# Patient Record
Sex: Female | Born: 1960 | Race: White | Hispanic: Yes
Health system: Midwestern US, Community
[De-identification: ages and names within clinical notes are randomized; demographics above are authoritative.]

## PROBLEM LIST (undated history)

## (undated) DIAGNOSIS — F329 Major depressive disorder, single episode, unspecified: Secondary | ICD-10-CM

## (undated) DIAGNOSIS — C801 Malignant (primary) neoplasm, unspecified: Secondary | ICD-10-CM

## (undated) DIAGNOSIS — R609 Edema, unspecified: Secondary | ICD-10-CM

## (undated) DIAGNOSIS — D1771 Benign lipomatous neoplasm of kidney: Secondary | ICD-10-CM

## (undated) DIAGNOSIS — Z8719 Personal history of other diseases of the digestive system: Secondary | ICD-10-CM

## (undated) DIAGNOSIS — U071 COVID-19: Secondary | ICD-10-CM

## (undated) DIAGNOSIS — Z973 Presence of spectacles and contact lenses: Secondary | ICD-10-CM

## (undated) DIAGNOSIS — C44310 Basal cell carcinoma of skin of unspecified parts of face: Secondary | ICD-10-CM

## (undated) DIAGNOSIS — D2372 Other benign neoplasm of skin of left lower limb, including hip: Secondary | ICD-10-CM

## (undated) DIAGNOSIS — Z85038 Personal history of other malignant neoplasm of large intestine: Secondary | ICD-10-CM

## (undated) DIAGNOSIS — C189 Malignant neoplasm of colon, unspecified: Secondary | ICD-10-CM

## (undated) DIAGNOSIS — F32A Depression, unspecified: Secondary | ICD-10-CM

## (undated) DIAGNOSIS — M069 Rheumatoid arthritis, unspecified: Secondary | ICD-10-CM

## (undated) DIAGNOSIS — K219 Gastro-esophageal reflux disease without esophagitis: Secondary | ICD-10-CM

## (undated) DIAGNOSIS — M159 Polyosteoarthritis, unspecified: Secondary | ICD-10-CM

## (undated) DIAGNOSIS — A6009 Herpesviral infection of other urogenital tract: Secondary | ICD-10-CM

## (undated) DIAGNOSIS — M150 Primary generalized (osteo)arthritis: Secondary | ICD-10-CM

## (undated) DIAGNOSIS — R7301 Impaired fasting glucose: Secondary | ICD-10-CM

## (undated) DIAGNOSIS — R5383 Other fatigue: Secondary | ICD-10-CM

## (undated) DIAGNOSIS — Z1211 Encounter for screening for malignant neoplasm of colon: Secondary | ICD-10-CM

## (undated) DIAGNOSIS — J019 Acute sinusitis, unspecified: Secondary | ICD-10-CM

## (undated) DIAGNOSIS — R7303 Prediabetes: Secondary | ICD-10-CM

## (undated) HISTORY — DX: Personal history of other malignant neoplasm of large intestine: Z85.038

## (undated) HISTORY — DX: Malignant neoplasm of colon, unspecified: C18.9

## (undated) HISTORY — PX: TUBAL LIGATION: SHX77

## (undated) HISTORY — DX: Benign lipomatous neoplasm of kidney: D17.71

## (undated) HISTORY — DX: Rheumatoid arthritis, unspecified: M06.9

## (undated) HISTORY — DX: Edema, unspecified: R60.9

## (undated) HISTORY — PX: COLON SURGERY: SHX602

## (undated) HISTORY — DX: Major depressive disorder, single episode, unspecified: F32.9

## (undated) HISTORY — DX: COVID-19: U07.1

## (undated) HISTORY — DX: Basal cell carcinoma of skin of unspecified parts of face: C44.310

## (undated) HISTORY — DX: Depression, unspecified: F32.A

## (undated) HISTORY — PX: COLONOSCOPY: SHX174

## (undated) HISTORY — DX: Other benign neoplasm of skin of left lower limb, including hip: D23.72

## (undated) HISTORY — PX: OTHER SURGICAL HISTORY: SHX169

---

## 1995-04-03 ENCOUNTER — Encounter: Payer: Self-pay | Admitting: Family Medicine

## 1995-04-03 LAB — CONVERTED CEMR LAB
Blood Glucose, Fasting: 94 mg/dL
RBC count: 4.83 10*6/uL
WBC, blood: 7.7 10*3/uL

## 1995-04-16 ENCOUNTER — Encounter: Payer: Self-pay | Admitting: Family Medicine

## 1995-04-16 LAB — CONVERTED CEMR LAB: Pap Smear: NORMAL

## 1996-10-05 ENCOUNTER — Encounter: Payer: Self-pay | Admitting: Family Medicine

## 1996-10-05 LAB — CONVERTED CEMR LAB
Pap Smear: NORMAL
TSH: 1.37 microintl units/mL

## 1997-12-12 ENCOUNTER — Encounter: Payer: Self-pay | Admitting: Family Medicine

## 1997-12-12 LAB — CONVERTED CEMR LAB: Pap Smear: NORMAL

## 1999-08-30 ENCOUNTER — Encounter: Payer: Self-pay | Admitting: Family Medicine

## 1999-09-02 ENCOUNTER — Encounter: Payer: Self-pay | Admitting: Family Medicine

## 1999-09-02 LAB — CONVERTED CEMR LAB: Rapid Strep: NEGATIVE

## 1999-09-05 ENCOUNTER — Encounter: Payer: Self-pay | Admitting: Family Medicine

## 1999-09-05 LAB — CONVERTED CEMR LAB
Blood Glucose, Fasting: 115 mg/dL
RBC count: 4.42 10*6/uL
WBC, blood: 6.2 10*3/uL

## 2000-06-25 ENCOUNTER — Encounter: Payer: Self-pay | Admitting: Family Medicine

## 2000-06-25 LAB — CONVERTED CEMR LAB
RBC count: 3.81 10*6/uL
WBC, blood: 6.1 10*3/uL

## 2000-06-29 ENCOUNTER — Encounter: Payer: Self-pay | Admitting: Family Medicine

## 2000-06-29 ENCOUNTER — Other Ambulatory Visit: Admission: RE | Admit: 2000-06-29 | Discharge: 2000-06-29 | Payer: Self-pay | Admitting: Family Medicine

## 2000-06-29 LAB — CONVERTED CEMR LAB: Pap Smear: NORMAL

## 2000-07-07 ENCOUNTER — Encounter: Payer: Self-pay | Admitting: Family Medicine

## 2000-07-07 ENCOUNTER — Encounter: Admission: RE | Admit: 2000-07-07 | Discharge: 2000-07-07 | Payer: Self-pay | Admitting: Family Medicine

## 2000-09-16 ENCOUNTER — Encounter: Payer: Self-pay | Admitting: Family Medicine

## 2000-09-16 LAB — CONVERTED CEMR LAB: RBC count: 4.63 10*6/uL

## 2000-12-09 ENCOUNTER — Encounter: Payer: Self-pay | Admitting: Family Medicine

## 2000-12-09 LAB — CONVERTED CEMR LAB: RBC count: 4.38 10*6/uL

## 2000-12-11 ENCOUNTER — Encounter: Payer: Self-pay | Admitting: Family Medicine

## 2001-03-30 ENCOUNTER — Other Ambulatory Visit: Admission: RE | Admit: 2001-03-30 | Discharge: 2001-03-30 | Payer: Self-pay | Admitting: Family Medicine

## 2001-03-30 ENCOUNTER — Encounter: Payer: Self-pay | Admitting: Family Medicine

## 2001-03-30 LAB — CONVERTED CEMR LAB: Pap Smear: NORMAL

## 2002-10-12 ENCOUNTER — Encounter: Payer: Self-pay | Admitting: Family Medicine

## 2002-10-12 LAB — CONVERTED CEMR LAB: Blood Glucose, Fasting: 68 mg/dL

## 2002-10-25 ENCOUNTER — Encounter: Payer: Self-pay | Admitting: Family Medicine

## 2002-10-25 ENCOUNTER — Other Ambulatory Visit: Admission: RE | Admit: 2002-10-25 | Discharge: 2002-10-25 | Payer: Self-pay | Admitting: Family Medicine

## 2002-10-25 LAB — CONVERTED CEMR LAB: Pap Smear: NORMAL

## 2003-01-27 ENCOUNTER — Encounter: Payer: Self-pay | Admitting: Family Medicine

## 2003-01-27 LAB — CONVERTED CEMR LAB
RBC count: 4.37 10*6/uL
WBC, blood: 5.9 10*3/uL

## 2003-04-06 ENCOUNTER — Encounter: Payer: Self-pay | Admitting: Emergency Medicine

## 2003-04-06 ENCOUNTER — Emergency Department (HOSPITAL_COMMUNITY): Admission: EM | Admit: 2003-04-06 | Discharge: 2003-04-06 | Payer: Self-pay | Admitting: Emergency Medicine

## 2003-05-25 ENCOUNTER — Encounter: Payer: Self-pay | Admitting: Family Medicine

## 2003-05-25 LAB — CONVERTED CEMR LAB
RBC count: 4.57 10*6/uL
WBC, blood: 5.2 10*3/uL

## 2003-09-18 ENCOUNTER — Other Ambulatory Visit: Admission: RE | Admit: 2003-09-18 | Discharge: 2003-09-18 | Payer: Self-pay | Admitting: Obstetrics and Gynecology

## 2003-11-15 ENCOUNTER — Encounter: Payer: Self-pay | Admitting: Family Medicine

## 2004-01-12 ENCOUNTER — Encounter (INDEPENDENT_AMBULATORY_CARE_PROVIDER_SITE_OTHER): Payer: Self-pay | Admitting: Specialist

## 2004-01-12 ENCOUNTER — Ambulatory Visit (HOSPITAL_COMMUNITY): Admission: RE | Admit: 2004-01-12 | Discharge: 2004-01-12 | Payer: Self-pay | Admitting: Obstetrics and Gynecology

## 2005-01-03 ENCOUNTER — Ambulatory Visit: Payer: Self-pay | Admitting: Family Medicine

## 2006-02-06 ENCOUNTER — Ambulatory Visit: Payer: Self-pay | Admitting: Family Medicine

## 2006-03-09 ENCOUNTER — Ambulatory Visit: Payer: Self-pay | Admitting: Family Medicine

## 2006-03-20 ENCOUNTER — Ambulatory Visit: Payer: Self-pay | Admitting: Family Medicine

## 2007-05-18 ENCOUNTER — Ambulatory Visit: Payer: Self-pay | Admitting: Family Medicine

## 2007-05-18 DIAGNOSIS — T50995A Adverse effect of other drugs, medicaments and biological substances, initial encounter: Secondary | ICD-10-CM | POA: Insufficient documentation

## 2007-05-19 ENCOUNTER — Telehealth: Payer: Self-pay | Admitting: Family Medicine

## 2007-08-30 ENCOUNTER — Telehealth: Payer: Self-pay | Admitting: Family Medicine

## 2008-07-28 ENCOUNTER — Telehealth: Payer: Self-pay | Admitting: Family Medicine

## 2008-08-03 ENCOUNTER — Ambulatory Visit: Payer: Self-pay | Admitting: Family Medicine

## 2008-08-03 DIAGNOSIS — G4700 Insomnia, unspecified: Secondary | ICD-10-CM

## 2008-08-03 DIAGNOSIS — F329 Major depressive disorder, single episode, unspecified: Secondary | ICD-10-CM

## 2008-08-08 ENCOUNTER — Telehealth: Payer: Self-pay | Admitting: Family Medicine

## 2008-08-08 ENCOUNTER — Encounter: Payer: Self-pay | Admitting: Family Medicine

## 2008-08-09 ENCOUNTER — Telehealth: Payer: Self-pay | Admitting: Family Medicine

## 2008-09-08 ENCOUNTER — Telehealth: Payer: Self-pay | Admitting: Family Medicine

## 2009-01-25 ENCOUNTER — Encounter: Payer: Self-pay | Admitting: Family Medicine

## 2009-01-26 ENCOUNTER — Ambulatory Visit: Payer: Self-pay | Admitting: Family Medicine

## 2009-01-26 DIAGNOSIS — M2550 Pain in unspecified joint: Secondary | ICD-10-CM | POA: Insufficient documentation

## 2009-01-26 DIAGNOSIS — M25469 Effusion, unspecified knee: Secondary | ICD-10-CM | POA: Insufficient documentation

## 2009-01-30 LAB — CONVERTED CEMR LAB
Anti Nuclear Antibody(ANA): NEGATIVE
Eosinophils Absolute: 0.2 10*3/uL (ref 0.0–0.7)
Eosinophils Relative: 2 % (ref 0–5)
HCT: 35.5 % — ABNORMAL LOW (ref 36.0–46.0)
Lymphs Abs: 1.5 10*3/uL (ref 0.7–4.0)
MCV: 83.5 fL (ref 78.0–100.0)
Monocytes Absolute: 0.9 10*3/uL (ref 0.1–1.0)
Neutrophil, Synovial: 2 % (ref 0–25)
Platelets: 313 10*3/uL (ref 150–400)
RDW: 13.6 % (ref 11.5–15.5)
Rhuematoid fact SerPl-aCnc: 23 intl units/mL — ABNORMAL HIGH (ref 0–20)
WBC, Synovial: 1440 — ABNORMAL HIGH (ref 0–200)
WBC: 12.9 10*3/uL — ABNORMAL HIGH (ref 4.0–10.5)

## 2009-02-02 ENCOUNTER — Ambulatory Visit: Payer: Self-pay | Admitting: Family Medicine

## 2009-02-07 ENCOUNTER — Encounter (INDEPENDENT_AMBULATORY_CARE_PROVIDER_SITE_OTHER): Payer: Self-pay | Admitting: *Deleted

## 2009-02-08 ENCOUNTER — Encounter: Payer: Self-pay | Admitting: Family Medicine

## 2009-02-23 ENCOUNTER — Encounter: Payer: Self-pay | Admitting: Family Medicine

## 2009-03-23 ENCOUNTER — Encounter: Payer: Self-pay | Admitting: Family Medicine

## 2009-06-22 ENCOUNTER — Encounter: Payer: Self-pay | Admitting: Family Medicine

## 2009-07-06 ENCOUNTER — Ambulatory Visit: Payer: Self-pay | Admitting: Family Medicine

## 2009-07-06 DIAGNOSIS — J020 Streptococcal pharyngitis: Secondary | ICD-10-CM

## 2009-10-19 ENCOUNTER — Encounter: Payer: Self-pay | Admitting: Family Medicine

## 2009-11-16 ENCOUNTER — Encounter: Payer: Self-pay | Admitting: Family Medicine

## 2009-12-10 ENCOUNTER — Telehealth: Payer: Self-pay | Admitting: Family Medicine

## 2010-02-11 ENCOUNTER — Ambulatory Visit: Payer: Self-pay | Admitting: Family Medicine

## 2010-02-11 ENCOUNTER — Telehealth: Payer: Self-pay | Admitting: Family Medicine

## 2010-02-11 DIAGNOSIS — R05 Cough: Secondary | ICD-10-CM

## 2010-02-13 ENCOUNTER — Telehealth: Payer: Self-pay | Admitting: Family Medicine

## 2010-02-26 ENCOUNTER — Encounter (INDEPENDENT_AMBULATORY_CARE_PROVIDER_SITE_OTHER): Payer: Self-pay | Admitting: *Deleted

## 2010-05-14 ENCOUNTER — Encounter: Payer: Self-pay | Admitting: Family Medicine

## 2010-06-20 ENCOUNTER — Encounter: Payer: Self-pay | Admitting: Family Medicine

## 2010-06-21 ENCOUNTER — Telehealth: Payer: Self-pay | Admitting: Family Medicine

## 2010-07-23 ENCOUNTER — Inpatient Hospital Stay: Admit: 2010-07-23 | Discharge: 2010-07-23 | Disposition: A | Discharge status: Home / Self Care

## 2010-07-23 MED ORDER — TETANUS-DIPHTHERIA TOXOIDS TD 5-2 LFU IM INJ
5-2 LFU | Freq: Once | INTRAMUSCULAR | Status: AC
Start: 2010-07-23 — End: 2010-07-23
  Administered 2010-07-23: 18:00:00 via INTRAMUSCULAR

## 2010-07-23 MED FILL — BACITRACIN ZINC 500 UNIT/GM EX OINT: 500 UNIT/GM | CUTANEOUS | Qty: 1

## 2010-07-23 MED FILL — DECAVAC 5-2 LFU IM INJ: 5-2 LFU | INTRAMUSCULAR | Qty: 0.5

## 2010-07-23 NOTE — ED Notes (Signed)
Tetanus info. Sheet provided to pt.    Elsworth SohoWilliam J Drakkar Medeiros, RN  07/23/10 [phone]

## 2010-07-23 NOTE — Discharge Instructions (Signed)
Laceration Care  A laceration is a cut or lesion that goes through all layers of the skin and into the tissue just beneath the skin.    BEFORE THE PROCEDURE  The laceration will be inspected by your caregiver for amount and extent of tissue damage, for bleeding, for evidence of foreign bodies (pieces of dirt, glass, etc.), and for cleanliness. Pain medications can be used if necessary. The wound will then be cleansed to help prevent infection. Sutures, staples or skin adhesive strips will be used to close the wound, stop bleeding and speed healing. Sometimes this will decrease the likelihood of infection and bleeding.  LET YOUR CAREGIVERS KNOW ABOUT THE FOLLOWING:   Allergies.   Medications taken including herbs, eye drops, over the counter medications, and creams.    Use of steroids (by mouth or creams).    Previous problems with anesthetics or novocaine.   Possibility of pregnancy, if this applies.   History of blood clots (thrombophlebitis).    History of bleeding or blood problems.    Previous surgery.    Other health problems.    RISKS AND COMPLICATIONS  Most lacerations heal fully. The healing time required varies depending on location. Complications of a laceration can include pain, bleeding, infection, dehiscence (splitting open or separation of the wound edges) and scar formation. The likelihood of complication depends on wound complexity, location, and on how the laceration occurred.  HOME CARE INSTRUCTIONS   If you were given a dressing, you should change it at least once a day, or as instructed by your caregiver. If the bandage sticks, soak it off with water.    Twice a day, wash the area with soap and water and rinse with plain water to remove all soap. Pat (do not rub) dry with a clean towel. Look for signs of infection (see below).    Re-apply prescribed creams or ointments as instructed. This will help prevent infection. This also helps keep the bandage from sticking.    If the bandage  becomes wet, dirty, or develops a foul smell, change it as soon as possible.    Only take over-the-counter or prescription medicines for pain, discomfort, or fever as directed by your doctor.    Have your sutures, staples or skin adhesive strips removed as instructed. Skin adhesive strips will peel off from the outer edges toward the center and will eventually fall off. Report to your caregiver if the strips are falling off and the wound does not appear fully healed.   SEEK MEDICAL CARE IF:   There is redness, swelling, or increasing pain in the wound.    There is a red line that goes up your arm or leg.    Pus is coming from wound.    You develop an unexplained oral temperature above 102 F (38.9 C), or as your caregiver suggests.    You notice a foul smell coming from the wound or dressing.    There is a breaking open of the wound (edges not staying together) before or after sutures have been removed.    You notice something coming out of the wound such as wood or glass.    The wound is on your hand or foot and you find that you are unable to properly move a finger or toe.    There is severe swelling around the wound causing pain and numbness or a change in color in your arm, hand, leg, or foot.   SEEK IMMEDIATE MEDICAL CARE IF:  Pain is not controlled with prescribed medication or with acetaminophen or ibuprofen.    There is severe swelling around the wound site.    The wound splits open and bleeding recurs.    You experience worsening numbness, weakness, or loss of function of any joint around or beyond the wound.    You develop painful lumps near the wound or on the skin anywhere on your body.   If you did not receive a tetanus shot today because you did not recall when your last one was given, make sure to check with your caregiver when you have your sutures removed to determine if you need one.  MAKE SURE YOU:    Understand these instructions.    Will watch your condition.    Will get  help right away if you are not doing well or get worse.   Document Released: 07/07/2005 Document Re-Released: 10/01/2009  [facility] Patient Information 2011 [place].

## 2010-07-23 NOTE — ED Provider Notes (Signed)
HPI  HISTORY OF PRESENT ILLNESS:  (Nurses Notes Reviewed)    CC: LACERATION    Source of history provided by:  patient.  Zykeia Naslund is a 50 y.o. old female presenting to the emergency department by private vehicle and ambulatory, for a laceration to the RMF, caused by metal edge, which occured a few hour(s) prior to arrival. There is not a possibility of retained foreign body in the affected area.  The patients tetanus status is more than 10 years ago.   Bleeding is  controlled. There is not pain at injury site. The injury was work related.     Past Medical History: has no past medical history on file.    Past Surgical History: has no past surgical history on file.    Social History:  reports that she has been smoking cigarettes.  She has been smoking about .25 packs per day. She does not have any smokeless tobacco history on file.  She reports that she does not currently drink alcohol or use illicit drugs.    Family History: family history is not on file.     The patient???s home medications have been reviewed.    Allergies: Codeine    REVIEW OF SYSTEMS:  Unless otherwise stated in this report or unable to obtain because of the patient's clinical or mental status as evidenced by the medical record, this patients's positive and negative responses for Review of Systems, constitutional, psych, eyes, ENT, cardiovascular, respiratory, gastrointestinal, neurological, genitourinary, musculoskeletal, integument systems and systems related to the presenting problem are either stated in the preceding or were not pertinent or were negative for the symptoms and/or complaints related to the medical problem.    Review of Systems    Physical Exam  PHYSICAL EXAM (Vital signs reviewed)    CONSTITUTIONAL:  Alert, development consistent with age.  NECK:  Normal ROM.  Supple. Non-tender.  FINGER(S):  Right 3rd dorsal aspect of  Proximal Phalanx and Middle Phalanx.         Tenderness:  none..         Swelling: None.           Deformity: no.        ROM: full range of motion.     HAND:         Tenderness:  none.        Swelling: None.       Deformity: no.       ROM full range of motion.   NEUROVASCULAR:         Motor deficit: none.        Sensory deficit: none.         Pulse deficit: none.         Capillary refill: normal.  SKIN:         Erythema: no.          Ecchymosis: no.       Abrasions: yes: linear.        Foreign Body:  No.          Lacerations:  yes: Laceration:   #1       Location:  RMF dorsum       Size:  2.5 cm       Type/Depth: subcutaneous.  The wound was explored with the following results: No foreign bodies found, No tendon laceration seen.  .       Rash:  none Location: N/A.       Abscess: none. Location: N/A.  LYMPHATICS: No lymphangitis or adenopathy noted.Marland Kitchen  NEUROLOGICAL:  Oriented.  Motor functions intact.    --------------------------------------------------RESULTS --------------------------------------------------  No results found for this or any previous visit.       ---------------------------- NURSING NOTES AND VITALS REVIEWED  -------------------------   The nursing notes within the ED encounter and vital signs as below have been reviewed.   BP 143/72   Pulse 82   Temp(Src) 98.1 ??F (36.7 ??C) (Oral)   Resp 14   Ht 5' 6"$  (1.676 m)   Wt 115 lb (52.164 kg)   BMI 18.56 kg/m2   SpO2 99%  Oxygen Saturation Interpretation: Normal      ------------------------------------------PROGRESS NOTES -------------------------------------------   I have spoken with the patient and discussed today???s results, in addition to providing specific details for the plan of care and counseling regarding the diagnosis and prognosis.  Their questions are answered at this time and they are agreeable with the plan.    ED Course Medications:                Medications   BuPROPion HCl (WELLBUTRIN PO) (not administered)   tetanus & diphtheria toxoids (adult) 5-2 LFU injection 0.5 mL (not administered)       Re-examinations:               none.    Consultations:              none.    PROCEDURES:              No closure required.Marland Kitchen    --------------------------------------- IMPRESSION & DISPOSITION -----------------------------    IMPRESSION:  1. 2.5cm Laceration RMF        DISPOSITION  Disposition: Discharge to work  Patient condition is good    Procedures    MDM            Elias Else, PA  07/23/10 1250  ATTENDING PROVIDER ATTESTATION:     I have personally performed and/or participated in the history, exam, medical decision making, and procedures and agree with all pertinent clinical information.      I have also reviewed and agree with the past medical, family and social history unless otherwise noted.      Elisabeth Cara, MD  07/23/10 [phone]

## 2010-08-22 NOTE — Assessment & Plan Note (Signed)
Summary: severe cough/alc   Vital Signs:  Patient profile:   50 year old female Weight:      137 pounds Temp:     98.9 degrees F oral Pulse rate:   88 / minute Pulse rhythm:   regular BP sitting:   120 / 80  (right arm) Cuff size:   regular  Vitals Entered By: Lowella Petties CMA (February 11, 2010 11:01 AM) CC: Cough since last week, fever of 101.3 this morning   History of Present Illness: Cough- Started with mild dry cough last week.  Then it "want croupy" a few days ago.  patient with fever last night along with sweats.  +Posttussive emesis (no NAV o/w) and throat is irritated. No sick contacts.  Minimal clear sputum.   No abdominal pain.  Ears and nose- no symptoms.  H/o RA and on MTX.    Depressed Mood: h/o and mMuch improved on meds.  Sleep decreased/ Insomnia/Early awakening: no Interest decreased in activities: not now Guilt or worthlessness:no Energy decreased:no Concentration difficulties:no Appetite disturbance or weight loss:no Psychomotor retardation/agitation:no Suicidal thoughts: no  Allergies (verified): No Known Drug Allergies  Past History:  Past Medical History: Depression:(03/04/1996) RA on MTX  Review of Systems       See HPI.  Otherwise negative.    Physical Exam  General:  GEN: nad, alert and oriented HEENT: mucous membranes moist, TM w/o erythema, nasal epithelium mildly injected, OP with cobblestoning but no exudates NECK: supple w/o LA CV: rrr. PULM: ctab, no inc wob, dry cough noted ABD: soft, +bs EXT: no edema    Impression & Recommendations:  Problem # 1:  COUGH (ICD-786.2) Supportive tx and use antibiotics in meantime.  Nontoxic but would treat given duration and MTX.  follow up as needed.  Out of work due to fever.  She agrees.   Problem # 2:  DEPRESSION, CHRONIC (ICD-311) No change in total daily dose of meds.  Mood stable.  The following medications were removed from the medication list:    Cymbalta 60 Mg Cpep (Duloxetine  hcl) .Marland Kitchen... 1daily by mouth Her updated medication list for this problem includes:    Cymbalta 30 Mg Cpep (Duloxetine hcl) .Marland Kitchen... Three caps by mouth once daily (90mg  total)  Complete Medication List: 1)  Cymbalta 30 Mg Cpep (Duloxetine hcl) .... Three caps by mouth once daily (90mg  total) 2)  Folic Acid 400 Mcg Tabs (Folic acid) .... Take one tablet daily 3)  Ibuprofen 800 Mg Tabs (Ibuprofen) .... Take one tablet by mouth three times daily 4)  Methotrexate 2.5 Mg Tabs (Methotrexate sodium) .... Take six pills by mouth once a week 5)  Azithromycin 250 Mg Tabs (Azithromycin) .... 2 by mouth for 1 day then 1 by mouth qday for 4 days 6)  Tussigon 5-1.5 Mg Tabs (Hydrocodone-homatropine) .Marland Kitchen.. 1 by mouth q6h as needed cough.  sedation caution  Patient Instructions: 1)  Please schedule a follow-up appointment as needed .  2)  Out of work until no fever.  Take the antibiotics; use the cough med as needed.   Prescriptions: TUSSIGON 5-1.5 MG TABS (HYDROCODONE-HOMATROPINE) 1 by mouth q6h as needed cough.  Sedation caution  #20 x 0   Entered and Authorized by:   Crawford Givens MD   Signed by:   Crawford Givens MD on 02/11/2010   Method used:   Print then Give to Patient   RxID:   [id number] AZITHROMYCIN 250 MG TABS (AZITHROMYCIN) 2 by mouth for 1 day then 1 by  mouth qday for 4 days  #6 x 0   Entered and Authorized by:   Crawford Givens MD   Signed by:   Crawford Givens MD on 02/11/2010   Method used:   Print then Give to Patient   RxID:   [id number] CYMBALTA 30 MG CPEP (DULOXETINE HCL) three caps by mouth once daily (90mg  total)  #270 x 3   Entered and Authorized by:   Crawford Givens MD   Signed by:   Crawford Givens MD on 02/11/2010   Method used:   Print then Give to Patient   RxID:   [id number]   Prior Medications (reviewed today): FOLIC ACID 400 MCG TABS (FOLIC ACID) take one tablet daily IBUPROFEN 800 MG TABS (IBUPROFEN) take one tablet by mouth three times daily METHOTREXATE 2.5  MG TABS (METHOTREXATE SODIUM) take six pills by mouth once a week Current Allergies (reviewed today): No known allergies

## 2010-08-22 NOTE — Letter (Signed)
Summary: Dr.G.Lyna Poser  Dr.G.Lyna Poser   Imported By: Beau Fanny 11/27/2009 16:29:18  _____________________________________________________________________  External Attachment:    Type:   Image     Comment:   External Document

## 2010-08-22 NOTE — Letter (Signed)
Summary: [facility]  [facility]   Imported By: Maryln Gottron 07/01/2010 13:28:51  _____________________________________________________________________  External Attachment:    Type:   Image     Comment:   External Document  Appended Document: [facility]    Clinical Lists Changes

## 2010-08-22 NOTE — Progress Notes (Signed)
Summary: Rx Cymbalta  Phone Note Refill Request Call back at [phone] Message [facility]  [facility] Dec 10, 2009 2:56 PM  Refills Requested: Medication #1:  CYMBALTA 60 MG  CPEP 1daily by mouth  Method Requested: Electronic Initial call taken by: Sydell Axon LPN,  Dec 10, 2009 2:56 PM  Follow-up for Phone Call        Pt needs to be seen for further medications. Follow-up by: Shaune Leeks MD,  Dec 10, 2009 4:47 PM  Additional Follow-up for Phone Call Additional follow up Details #1::        Pharmacist Joni Reining) notified as instructed by telephone and was informed that she will inform patient of this. Additional Follow-up by: Sydell Axon LPN,  Dec 10, 2009 4:56 PM    Prescriptions: CYMBALTA 60 MG  CPEP (DULOXETINE HCL) 1daily by mouth  #30 x 0   Entered and Authorized by:   Shaune Leeks MD   Signed by:   Shaune Leeks MD on 12/10/2009   Method used:   [facility]        [facility] ([facility]ail)       [address]       [place], [place]  [address]       Ph: [phone] or [phone]       Fax: [phone]   RxID:   [id number]

## 2010-08-22 NOTE — Letter (Signed)
Summary: Nadara Eaton letter  [place] at [facility]  [address] [place], [place] [address]   Phone: [phone]  Fax: [phone]       02/26/2010 MRN: [id number]  [facility] [address] [place], [place]  [address]  Dear Ms. Maisie Fus,  [facility] [place], and [facility] announce the retirement of Arta Silence, M.D., from full-time practice at the [facility] office effective January 17, 2010 and his plans of returning part-time.  It is important to Dr. Hetty Ely and to our practice that you understand that [facility] has seven physicians in our office for your health care needs.  We will continue to offer the same exceptional care that you have today.    Dr. Hetty Ely has spoken to many of you about his plans for retirement and returning part-time in the fall.   We will continue to work with you through the transition to schedule appointments for you in the office and meet the high standards that [place] is committed to.   Again, it is with great pleasure that we share the news that Dr. Hetty Ely will return to [facility] at [facility] in October of 2011 with a reduced schedule.    If you have any questions, or would like to request an appointment with one of our physicians, please call us at [phone] and press the option for Scheduling an appointment.  We take pleasure in providing you with excellent patient care and look forward to seeing you at your next office visit.  Our [facility] Physicians are:  Tillman Abide, M.D. Laurita Quint, M.D. Roxy Manns, M.D. Kerby Nora, M.D. Hannah Beat, M.D. Ruthe Mannan, M.D. We proudly welcomed Raechel Ache, M.D. and Eustaquio Boyden, M.D. to the practice in July/August 2011.  Sincerely,  [facility]

## 2010-08-22 NOTE — Progress Notes (Signed)
Summary: regarding tussigon, cymbalta  Phone Note Refill Request Message from:  Pharmacy  Refills Requested: Medication #1:  TUSSIGON 5-1.5 MG TABS 1 by mouth q6h as needed cough.  [facility] caution. [facility], please change to something else.  Also, on her cymbalta, she has been getting 30 mg's, taking 3 a day and she is asking if ok to change to one 60 mg and one 30 mg daily.  She says this would be cheaper for her.  [facility]  Initial call taken by: Lowella Petties CMA,  February 11, 2010 1:17 PM  Follow-up for Phone Call        dc prev rxs; please send in these 3 rxs: 1. chlorpheniramine/hydrocodone susp 8mg /10mg /75ml.  5 ml by mouth two times a day as needed cough. #179ml no rf.   2. cymbalta 30mg  by mouth qday.  #90, 3rf 3. cymbalta 60mg  by mouth qday.  #90, 3rf Follow-up by: Crawford Givens MD,  February 11, 2010 1:41 PM  Additional Follow-up for Phone Call Additional follow up Details #1::        Medication phoned to pharmacy.  Additional Follow-up by: Delilah Shan CMA Zaylon Bossier Dull),  February 11, 2010 2:40 PM

## 2010-08-22 NOTE — Letter (Signed)
Summary: [facility]  [facility]   Imported By: Maryln Gottron 05/23/2010 14:45:08  _____________________________________________________________________  External Attachment:    Type:   Image     Comment:   External Document  Appended Document: [facility]    Clinical Lists Changes  Observations: Added new observation of PAST MED HX: Depression:(03/04/1996) arthritis per rheum [facility]) (05/23/2010 15:21)       Past History:  Past Medical History: Depression:(03/04/1996) arthritis per rheum [facility])

## 2010-08-22 NOTE — Progress Notes (Signed)
Summary: refill request for cymbalta  Phone Note Refill Request Message from:  Fax from Pharmacy  Refills Requested: Medication #1:  CYMBALTA 30 MG CPEP three caps by mouth once daily (90mg  ttal)   Last Refilled: 03/20/2010  Medication #2:  Cymbalta 60 mg   Last Refilled: 03/20/2010 [facility] [address]  The 60 mg dose is no longer on med list.  Initial call taken by: Lowella Petties CMA, AAMA,  June 21, 2010 9:04 AM  Follow-up for Phone Call        please clarify with pharm and deny this.  he is taking Cymbalta 30 Mg Cpep (Duloxetine hcl) .... Three caps by mouth once daily (90mg  total), so he shouldn't need the 60mg  dose.  thanks.   Follow-up by: Crawford Givens MD,  June 21, 2010 9:50 AM  Additional Follow-up for Phone Call Additional follow up Details #1::        Phoned pharmacy and left a message as above.  Lugene Fuquay CMA (AAMA)  June 21, 2010 10:28 AM   Pt called and said she needs seperate 30 and 60 mg scripts for insurance reasons, less costly that way.  Also, she wants a 90 day supply of each. Additional Follow-up by: Lowella Petties CMA, AAMA,  June 21, 2010 12:07 PM    Additional Follow-up for Phone Call Additional follow up Details #2::    void out the prev rx.  please send in rx for cymbalta 60mg  a day, #90, 3rf and rx for cymbalta 30mg  a day, #90, 3rf.  thanks.  please update med list.  Follow-up by: Crawford Givens MD,  June 21, 2010 2:02 PM  New/Updated Medications: CYMBALTA 60 MG CPEP (DULOXETINE HCL) Take 1 tablet by mouth once a day CYMBALTA 30 MG CPEP (DULOXETINE HCL) Take 1 tablet by mouth once a day Prescriptions: CYMBALTA 30 MG CPEP (DULOXETINE HCL) Take 1 tablet by mouth once a day  #90 x 3   Entered by:   Delilah Shan CMA (AAMA)   Authorized by:   Crawford Givens MD   Signed by:   Delilah Shan CMA (AAMA) on 06/21/2010   Method used:   [facility]        [id number]       [address]       [place], [place]  [address]       Ph: [phone] or [phone]       Fax: [phone]   RxID:   [id number] CYMBALTA 60 MG CPEP (DULOXETINE HCL) Take 1 tablet by mouth once a day  #90 x 3   Entered by:   Delilah Shan CMA (AAMA)   Authorized by:   Crawford Givens MD   Signed by:   Delilah Shan CMA (AAMA) on 06/21/2010   Method used:   [facility]        [facility] [address] (retail)       [address]       [place], [place]  [address]       Ph: [phone] or [phone]       Fax: [phone]   RxID:   [id number]

## 2010-08-22 NOTE — Letter (Signed)
Summary: Dr.G.Lyna Poser  Dr.G.Lyna Poser   Imported By: Beau Fanny 10/30/2009 10:43:25  _____________________________________________________________________  External Attachment:    Type:   Image     Comment:   External Document

## 2010-08-22 NOTE — Progress Notes (Signed)
Summary: not any better  Phone Note Call from Patient Call back at Home Phone [phone]   Caller: Patient Summary of Call: Pt was seen on monday for a cough.  She is not any better.  Cough medicine isnt helping, she still has low grade fever.  She had some wheezing this morning.  Taking z-pack.  [facility]. Initial call taken by: Lowella Petties CMA,  February 13, 2010 10:17 AM  Follow-up for Phone Call        Please call in proventil HFA, 2 puffs q 4 h as needed for cough or wheeze. #1, no rf.  If progressively short of breath, then needs follow up with MD.  I would finisht the antibiotics.  Follow-up by: Crawford Givens MD,  February 13, 2010 11:14 AM  Additional Follow-up for Phone Call Additional follow up Details #1::        Patient Advised.  Additional Follow-up by: Delilah Shan CMA (AAMA),  February 13, 2010 5:55 PM    New/Updated Medications: PROVENTIL HFA 108 (90 BASE) MCG/ACT AERS (ALBUTEROL SULFATE) Take 2 puffs every 4 hours as needed for cough and wheeze Prescriptions: PROVENTIL HFA 108 (90 BASE) MCG/ACT AERS (ALBUTEROL SULFATE) Take 2 puffs every 4 hours as needed for cough and wheeze  #1 x 0   Entered by:   Delilah Shan CMA (AAMA)   Authorized by:   Crawford Givens MD   Signed by:   Delilah Shan CMA (AAMA) on 02/13/2010   Method used:   [facility]        [facility] (retail)       [address]       [place], [place]  [address]       Ph: [phone] or [phone]       Fax: [phone]   RxID:   [id number]

## 2010-10-17 ENCOUNTER — Encounter: Payer: Self-pay | Admitting: Family Medicine

## 2010-10-18 ENCOUNTER — Encounter: Payer: Self-pay | Admitting: Family Medicine

## 2010-10-18 ENCOUNTER — Ambulatory Visit (INDEPENDENT_AMBULATORY_CARE_PROVIDER_SITE_OTHER): Payer: BC Managed Care – PPO | Admitting: Family Medicine

## 2010-10-18 VITALS — BP 130/80 | HR 79 | Temp 97.9°F | Ht 62.0 in | Wt 158.8 lb

## 2010-10-18 DIAGNOSIS — M1380 Other specified arthritis, unspecified site: Secondary | ICD-10-CM | POA: Insufficient documentation

## 2010-10-18 DIAGNOSIS — M064 Inflammatory polyarthropathy: Secondary | ICD-10-CM

## 2010-10-18 DIAGNOSIS — M1990 Unspecified osteoarthritis, unspecified site: Secondary | ICD-10-CM | POA: Insufficient documentation

## 2010-10-18 MED ORDER — PREDNISONE 10 MG PO TABS: ORAL_TABLET | ORAL | Status: AC

## 2010-10-18 NOTE — Progress Notes (Signed)
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 Labs reviewed from last year when I evaluated and had my initial concern and consult notes reviewed.   RA flare: I recall this patient well and was diagnosed with inflammatory arthropathy, and probable rheumatoid arthritis last year. She sees Dr. Gavin Potters, and had been on methotrexate, however she had discontinued this in September, 2011. Since that time she has had gradually increasing arthropathy and joint pains, and within the last 1-2 weeks she has had increasing joint pain and increasing swelling joints.  She does have some morning aching and will have morning aching in her knees, feet as well as her hands for some time. Her hands and wrists aren't bothering her to a great degree right now. He has significant large effusions of the knee as well as swelling in and around her MTPs and her true ankle joint.  No specific trauma or injury.  REVIEW OF SYSTEMS  GEN: No fevers, chills. Nontoxic. Primarily MSK c/o today. MSK: Detailed in the HPI GI: tolerating PO intake without difficulty Neuro: No numbness, parasthesias, or tingling associated. Otherwise the pertinent positives of the ROS are noted above.    The PMH, PSH, Social History, Family History, Medications, and allergies have been reviewed in [facility], and have been updated if relevant.  GEN: Well-developed,well-nourished,in no acute distress; alert,appropriate and cooperative throughout examination HEENT: Normocephalic and atraumatic without obvious abnormalities. Ears, externally no deformities PULM: Breathing comfortably in no respiratory distress EXT: No clubbing, cyanosis, or edema PSYCH: Normally interactive. Cooperative during the interview. Pleasant. Friendly and conversant. Not anxious or depressed appearing. Normal, full affect.  Gait: Normal heel toe pattern, mild antalgia ROM: 0-120 Effusion: Moderate effusions bilateral knees.  Significant ankle effusion.  Echymosis or edema: Mild pedal edema  Patellar tendon NT Painful PLICA: neg Patellar grind: negative Medial and lateral patellar facet loading: negative medial and lateral joint lines: Tender to palpation  Mcmurray's neg Flexion-pinch positive Varus and valgus stress: stable Lachman: neg Ant and Post drawer: neg Hip abduction, IR, ER: WNL Hip flexion str: 5/5 Hip abd: 5/5 Quad: 5/5 VMO atrophy: Mild Hamstring concentric and eccentric: 5/5  Assessment and plan: Rheumatoid arthritis flare: The patient has been taking ibuprofen alone 600 mg by mouth twice a day. Think she is having significant rheumatological flare, multi-joint involvement, MTPs, severe involvement of the knees, as well as the true ankle joints bilaterally. Oral prednisone taper as below. She has followup with rheumatology in one week.

## 2010-12-06 NOTE — Op Note (Signed)
NAME:  Jacqueline Garza, Jacqueline Garza                          ACCOUNT NO.:  [id number]   MEDICAL RECORD NO.:  [id number]                   PATIENT TYPE:  AMB   LOCATION:  SDC                                  FACILITY:  [facility]   PHYSICIAN:  Richardean Sale, M.D.                DATE OF BIRTH:  25-Oct-1960   DATE OF PROCEDURE:  01/12/2004  DATE OF DISCHARGE:                                 OPERATIVE REPORT   PREOPERATIVE DIAGNOSES:  1. Menorrhagia.  2. Anemia.  3. Endometrial polyps.   POSTOPERATIVE DIAGNOSES:  1. Menorrhagia.  2. Anemia.  3. Endometrial polyps.   PROCEDURE:  Hysteroscopy, D&C.   SURGEON:  Richardean Sale, M.D.   ANESTHESIA:  Conscious sedation with paracervical block.   ESTIMATED BLOOD LOSS:  Minimal.   FINDINGS:  Multiple endometrial polyps.   SPECIMENS:  Endometrial polyp sent to pathology.   FLUID DEFICIT:  60 mL.   COMPLICATIONS:  None.   INDICATIONS FOR PROCEDURE:  This is a 50 year old white female with  menorrhagia while on oral contraceptive pills and subsequent anemia who was  evaluated by ultrasound and endometrial biopsy and found to have endometrial  polyps.  She presents today for stress __________.  The procedure has been  reviewed with her in detail and informed consent has been obtained.  We  discussed the risk of hemorrhage, uterine perforation, infection, injury to  bowel or bladder; if perforation occurs, it would require additional  surgery, anesthetic related complications and even death.  The patient  voices understanding of all these risks and agrees to proceed.  Consent  signed before preceding to the OR.   DESCRIPTION OF PROCEDURE:  The patient was taken to the operating room where  she was given conscious sedation.  She was then prepped and draped in the  usual sterile fashion with Hibiclens and placed in the dorsal lithotomy  position.  The red rubber catheter was used to drain the bladder.  Approximately 100 mL of clear urine were expelled.   Bimanual examination was  performed which revealed a retroflexed uterus.  A speculum was placed into  the vagina and 2 mL of 1% Nesacaine were injected on the anterior lip of the  cervix.  Anterior lip was then grasped with single-tooth tenaculum and a  paracervical block was administered using a total of 20 mL of Nesacaine.  At  this point, the uterus was then dilated very slowly with the Hegar dilators.  The speculum had to be removed in order to facilitate dilation given the  patient's retroflexed uterus and tension was held with the single-tooth  tenaculum to align the cervical canal and the fundus.  Once dilation was  performed, the hysteroscope was introduced and there was an obvious polyp  sitting at the internal os of the cervix.  This was removed using polyp  forceps.  The hysteroscope was then reintroduced and there were still  multiple  polyps present.  Sharp curettage was then performed and multiple  polyps were removed.  These were sent to pathology with endometrial polyps  and endometrial curettings.  The hysteroscope was then reintroduced.  The  uterine cavity was now clear of any polyps and the hysteroscope was removed.  There was no bleeding coming from the cervix. Single-tooth tenaculum was  removed. The cervix was removed.  The cervix was inspected and there was no  bleeding coming from the tenaculum site.  Bimanual examination was performed  which confirmed a very small retroflexed uterus.  The patient tolerated the  procedure well.  There were no complications and no evidence for perforation  during the entire procedure.  Sponge, lap, needle and instrument counts  correct x 2.                                               Richardean Sale, M.D.    JW/MEDQ  D:  01/12/2004  T:  01/12/2004  Job:  [id number]

## 2011-04-08 ENCOUNTER — Ambulatory Visit: Payer: Self-pay | Admitting: Gastroenterology

## 2011-04-16 DIAGNOSIS — D1771 Benign lipomatous neoplasm of kidney: Secondary | ICD-10-CM | POA: Insufficient documentation

## 2011-04-21 ENCOUNTER — Ambulatory Visit: Payer: Self-pay | Admitting: Gastroenterology

## 2011-04-22 ENCOUNTER — Encounter: Payer: Self-pay | Admitting: Family Medicine

## 2011-04-30 ENCOUNTER — Encounter: Payer: Self-pay | Admitting: Family Medicine

## 2011-05-02 ENCOUNTER — Ambulatory Visit: Payer: Self-pay | Admitting: Gastroenterology

## 2011-06-26 ENCOUNTER — Other Ambulatory Visit: Payer: Self-pay | Admitting: Family Medicine

## 2011-06-26 NOTE — Telephone Encounter (Signed)
Left message on machine for patient to call back.

## 2011-06-26 NOTE — Telephone Encounter (Signed)
Called pharmacy to make script 90/0RF for both Cymbalta 30 and Cymbalta 60 (computer only let me switch no refills on one of the prescriptions today) and message for pt to be seen by Dr Para March before next refill needed. Please let pt know she needs to be seen before needing another prescription.

## 2011-07-01 NOTE — Telephone Encounter (Signed)
Patient notified as instructed by telephone. 

## 2011-09-23 ENCOUNTER — Other Ambulatory Visit: Payer: Self-pay | Admitting: Rheumatology

## 2011-09-23 LAB — SYNOVIAL FLUID, CRYSTAL: Crystals, Joint Fluid: NONE SEEN

## 2011-09-23 LAB — BODY FLUID CELL COUNT WITH DIFFERENTIAL
Eosinophil: 0 %
Lymphocytes: 25 %
Neutrophils: 0 %
Nucleated Cell Count: 814 /mm3
Other Cells BF: 0 %

## 2011-11-06 DIAGNOSIS — D2372 Other benign neoplasm of skin of left lower limb, including hip: Secondary | ICD-10-CM

## 2011-11-06 HISTORY — DX: Other benign neoplasm of skin of left lower limb, including hip: D23.72

## 2012-01-26 ENCOUNTER — Ambulatory Visit (INDEPENDENT_AMBULATORY_CARE_PROVIDER_SITE_OTHER): Payer: BC Managed Care – PPO | Admitting: Family Medicine

## 2012-01-26 ENCOUNTER — Encounter: Payer: Self-pay | Admitting: Family Medicine

## 2012-01-26 VITALS — BP 128/82 | HR 80 | Temp 98.2°F | Ht 62.0 in | Wt 155.8 lb

## 2012-01-26 DIAGNOSIS — M069 Rheumatoid arthritis, unspecified: Secondary | ICD-10-CM

## 2012-01-26 DIAGNOSIS — F329 Major depressive disorder, single episode, unspecified: Secondary | ICD-10-CM

## 2012-01-26 MED ORDER — DULOXETINE HCL 30 MG PO CPEP: 30.0000 mg | ORAL_CAPSULE | Freq: Every day | ORAL | Status: DC

## 2012-01-26 NOTE — Assessment & Plan Note (Signed)
Inc cymbalta to 90 mg.  This may provide some relief.  She contracts for safety.  We may need to change meds, but this is a reasonable starting place.  She'll check on counseling.  Okay for outpatient f/u.  >25 min spent with face to face with patient, >50% counseling and/or coordinating care

## 2012-01-26 NOTE — Progress Notes (Signed)
Depressed Mood: yes, more tearful, more irritable, doesn't want to get out of bed Insomnia and early waking: yes Interest decreased in activities: yes Guilt or worthlessness:yes Energy decreased: yes Concentration difficulties: yes Appetite disturbance or weight loss: no Psychomotor retardation/agitation:no Suicidal thoughts: no Her work situation is a struggle for her.  She has a high level of responsibility.  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   On cymbalta, 60mg  a day.   Prev on prozac but the effect wore off.   RA per [facility].    PMH and SH reviewed  Meds, vitals, and allergies reviewed.   ROS: See HPI.  Otherwise negative.    NAD, tearful but regains composure Speech fluent Judgement intact NCAT RRR CTAB No tremor

## 2012-01-26 NOTE — Patient Instructions (Addendum)
Take 60 + 30 mg of cymbalta, 90mg  per day total.  Ask about seeing Dr. Laymond Purser for counseling.  Call me with an update in 1 month, sooner if needed.  Come back in 1 month is not improved.   Take care.

## 2012-02-13 ENCOUNTER — Telehealth: Payer: Self-pay | Admitting: Family Medicine

## 2012-02-13 MED ORDER — DULOXETINE HCL 30 MG PO CPEP: 30.0000 mg | ORAL_CAPSULE | Freq: Every day | ORAL | Status: DC

## 2012-02-13 NOTE — Telephone Encounter (Signed)
Pt needed 90 day supply of cymbalta.  Printed and given to husband.

## 2012-03-15 ENCOUNTER — Other Ambulatory Visit: Payer: Self-pay | Admitting: *Deleted

## 2012-03-15 MED ORDER — DULOXETINE HCL 60 MG PO CPEP: ORAL_CAPSULE | ORAL | Status: DC

## 2012-03-15 NOTE — Telephone Encounter (Signed)
Please call in.  Call and get update on patient.

## 2012-03-15 NOTE — Telephone Encounter (Signed)
Faxed refill request   

## 2012-03-16 NOTE — Telephone Encounter (Signed)
LMOVM to return call.

## 2012-03-17 NOTE — Telephone Encounter (Signed)
Patient called for medication; refill sent to pharmacy

## 2012-03-18 ENCOUNTER — Encounter: Payer: Self-pay | Admitting: *Deleted

## 2012-03-18 NOTE — Telephone Encounter (Signed)
LMOVM to return call and mailed a letter.

## 2012-03-24 NOTE — Telephone Encounter (Signed)
Pt left v/m; medication has helped and pt is doing better than when last saw Dr Para March.

## 2012-04-02 NOTE — Patient Instructions (Signed)
Depression Treatment: After Your Visit  Your Care Instructions  Depression is a condition that affects the way you feel, think, and act. It causes symptoms such as low energy, loss of interest in daily activities, and sadness or grouchiness that goes on for a long time. Depression is very common and affects men and women of all ages.  Depression is a medical illness caused by changes in the natural chemicals in your brain. It is not a character flaw, and it does not mean that you are a bad or weak person. It does not mean that you are going crazy.  It is important to know that depression can be treated. Medicines, counseling, and self-care can all help. Many people do not get help because they are embarrassed or think that they will get over the depression on their own. But some people do not get better without treatment.  Follow-up care is a key part of your treatment and safety. Be sure to make and go to all appointments, and call your doctor if you are having problems. It???s also a good idea to know your test results and keep a list of the medicines you take.  How can you care for yourself at home?  Learn about antidepressant medicines  Antidepressant medicines can improve or end the symptoms of depression. You may need to take the medicine for at least 6 months, and often longer. Keep taking your medicine even if you feel better. If you stop taking it too soon, your symptoms may come back or get worse.  You may start to feel better within 1 to 3 weeks of taking antidepressant medicine. But it can take as many as 6 to 8 weeks to see more improvement. Talk to your doctor if you have problems with your medicine or if you do not notice any improvement after 3 weeks.  Antidepressants can make you feel tired, dizzy, or nervous. Some people have dry mouth, constipation, headaches, sexual problems, an upset stomach, or diarrhea. Many of these side effects are mild and go away on their own after you take the medicine  for a few weeks. Some may last longer. Talk to your doctor if side effects bother you too much. You might be able to try a different medicine. If you are pregnant or breast-feeding, talk to your doctor about what medicines you can take.  Learn about counseling  In many cases, counseling can work as well as medicines to treat mild to moderate depression. Counseling is done by licensed mental health providers, such as psychologists, social workers, and some types of nurses. It can be done in one-on-one sessions or in a group setting. Many people find group sessions helpful.  Cognitive-behavioral therapy is a type of counseling. In this treatment therapy, you learn how to see and change unhelpful thinking styles that may be adding to your depression. Counseling and medicines often work well when used together.  To manage depression  ?? Be physically active. Getting 30 minutes of exercise each day is good for your body and your mind. Begin slowly if it is hard for you to get started. If you already exercise, keep it up.  ?? Plan something pleasant for yourself every day. Include activities that you have enjoyed in the past.  ?? Get enough sleep. Talk to your doctor if you have problems sleeping.  ?? Eat a balanced diet. If you do not feel hungry, eat small snacks rather than large meals.  ?? Do not drink alcohol, use   illegal drugs, or take medicines that your doctor has not prescribed for you. They may interfere with your treatment.  ?? Spend time with family and friends. It may help to speak openly about your depression with people you trust.  ?? Take your medicines exactly as prescribed. Call your doctor if you think you are having a problem with your medicine.  ?? Do not make major life decisions while you are depressed. Depression may change the way you think. You will be able to make better decisions after you feel better.  ?? Think positively. Challenge negative thoughts with statements such as "I am hopeful"; "Things will  get better"; and "I can ask for the help I need." Write down these statements and read them often, even if you don't believe them yet.  ?? Be patient with yourself. It took time for your depression to develop, and it will take time for your symptoms to improve. Do not take on too much or be too hard on yourself.  ?? Learn all you can about depression from written and online materials.  ?? Check out behavioral health classes to learn more about dealing with depression.  When should you call for help?  Call 911 anytime you think you may need emergency care. For example, call if:  ?? You feel you cannot stop from hurting yourself or someone else.  Call your doctor now or seek immediate medical care if:  ?? You hear voices.  ?? You feel much more depressed.  Watch closely for changes in your health, and be sure to contact your doctor if:  ?? You are having problems with your depression medicine.  ?? You are not getting better as expected.    Where can you learn more?    Go to [email or web address] and sign in to your MyChart account. Enter G693 in the Search Health Information box to learn more about ???Depression Treatment: After Your Visit.???    If you do not have an account, please click on the ???Sign Up Now??? link.      ?? 2006-2013 [employer]. Care instructions adapted under license by [facility]. This care instruction is for use with your licensed healthcare professional. If you have questions about a medical condition or this instruction, always ask your healthcare professional. [employer], Inc[employer] disclaims any warranty or liability for your use of this information.  Content Version: 9.7.130178; Last Revised: October 05, 2009

## 2012-04-02 NOTE — Progress Notes (Signed)
SUBJECTIVE  [name] is a 51 y.o. female.    HPI/Chief C/O:  Chief Complaint   Patient presents with   ??? Check-Up   ??? Medication Refill   ??? Facial Pain     sinus drainage   ??? Chest Congestion   ??? Cough     Allergies   Allergen Reactions   ??? Codeine Itching   she is here to go over meds and possible change in welllbutrin    ROS:  Review of Systems   Constitutional: Negative for fever, chills, weight loss, malaise/fatigue and diaphoresis.   HENT: Negative for hearing loss, ear pain, nosebleeds, congestion, neck pain, tinnitus and ear discharge.    Eyes: Negative for blurred vision, double vision, photophobia, pain and discharge.   Respiratory: Negative for cough, hemoptysis, sputum production, shortness of breath, wheezing and stridor.    Cardiovascular: Negative for chest pain, palpitations, orthopnea, claudication and leg swelling.   Gastrointestinal: Negative for heartburn, nausea, vomiting, abdominal pain, diarrhea and constipation.   Genitourinary: Negative.    Musculoskeletal: Negative for myalgias, back pain, joint pain and falls.   Skin: Negative.    Neurological: Positive for weakness. Negative for dizziness, tingling, tremors, sensory change, speech change, focal weakness, seizures and headaches.   Psychiatric/Behavioral: Positive for depression.       Past Medical/Surgical Hx;  Reviewed with patient  History reviewed. No pertinent past medical history.  History reviewed. No pertinent past surgical history.    Past Family Hx:  Reviewed with patient  History reviewed. No pertinent family history.    Social Hx:  Reviewed with patient  History   Substance Use Topics   ??? Smoking status: Current Every Day Smoker -- 0.25 packs/day     Types: Cigarettes   ??? Smokeless tobacco: Never Used   ??? Alcohol Use: No       OBJECTIVE  BP 104/66   Pulse 70   Ht 5\' 6"  (1.676 m)   Wt 109 lb (49.442 kg)   BMI 17.6 kg/m2   Breastfeeding? No    Problem List:  Jane Romero has Depression; Osteoarthritis; and Herpes genitalis in  women on her problem list.    PHYS EX:  Physical Exam   Constitutional: She is oriented to person, place, and time. She appears well-developed and well-nourished.   HENT:   Head: Normocephalic.   Right Ear: External ear normal.   Left Ear: External ear normal.   Nose: Nose normal.   Eyes: Conjunctivae and EOM are normal. Pupils are equal, round, and reactive to light.   Neck: Normal range of motion. Neck supple.   Cardiovascular: Normal rate, regular rhythm and normal heart sounds.    No murmur heard.  Pulmonary/Chest: Effort normal and breath sounds normal. She has no wheezes.   Abdominal: Soft. Bowel sounds are normal. She exhibits no distension.   Musculoskeletal: Normal range of motion. She exhibits no edema and no tenderness.   Neurological: She is alert and oriented to person, place, and time. She has normal reflexes.   Skin: Skin is warm and dry.   Psychiatric:   She is depressed and feels down       ASSESSMENT/PLAN  Jane Romero was seen today for check-up, medication refill, facial pain, chest congestion and cough.    Diagnoses and associated orders for this visit:    Osteoarthritis  - ibuprofen (ADVIL;MOTRIN) 800 MG tablet; Take 1 tablet by mouth every 8 hours as needed.    Depression  - buPROPion (WELLBUTRIN XL) 300  MG XL tablet; Take 1 tablet by mouth every morning.    Herpes genitalis in women  - famciclovir (FAMVIR) 500 MG tablet; Take 1 tablet by mouth daily.    IFG (impaired fasting glucose)  - CBC Auto Differential; Future  - COMP METABOLIC PROF; Future  - Hemoglobin A1c; Future    Dyslipidemia  - Lipid panel; Future    Fatigue  - TSH; Future    Anxiety  - busPIRone (BUSPAR) 5 MG tablet; Take 1 tablet by mouth 2 times daily.    Other Orders  - Discontinue: famciclovir (FAMVIR) 500 MG tablet; Take 500 mg by mouth daily.  - Discontinue: ibuprofen (ADVIL;MOTRIN) 800 MG tablet; Take 800 mg by mouth every 8 hours as needed.          Outpatient Encounter Prescriptions as of 04/02/2012   Medication Sig Dispense  Refill   ??? famciclovir (FAMVIR) 500 MG tablet Take 1 tablet by mouth daily.  90 tablet  1   ??? ibuprofen (ADVIL;MOTRIN) 800 MG tablet Take 1 tablet by mouth every 8 hours as needed.  270 tablet  1   ??? buPROPion (WELLBUTRIN XL) 300 MG XL tablet Take 1 tablet by mouth every morning.  90 tablet  1   ??? busPIRone (BUSPAR) 5 MG tablet Take 1 tablet by mouth 2 times daily.  60 tablet  3   ??? DISCONTD: famciclovir (FAMVIR) 500 MG tablet Take 500 mg by mouth daily.       ??? DISCONTD: ibuprofen (ADVIL;MOTRIN) 800 MG tablet Take 800 mg by mouth every 8 hours as needed.       ??? DISCONTD: buPROPion (WELLBUTRIN XL) 300 MG XL tablet Take 1 tablet by mouth every morning.  30 tablet  0     No facility-administered encounter medications on file as of 04/02/2012.       Return in about 6 weeks (around 05/14/2012).    Reviewed recent labs related to [facility] current problems      Discussed importance of regular Health Maintenance follow up  Health Maintenance   Topic   ??? Breast Cancer Screening    ??? Colon Cancer Screening Colonoscopy    ??? Flu Vaccine Yearly (Adult)    ??? Cervical Cancer Screening    ??? Tetanus Vaccine Adult (11 Years And Up)            COUNCIL--anxiety and depression

## 2012-04-26 ENCOUNTER — Encounter

## 2012-04-26 NOTE — Progress Notes (Signed)
Labs drawn per Dr. Richards order

## 2012-04-26 NOTE — Telephone Encounter (Signed)
Patient called for medication refill  ]

## 2012-04-27 LAB — CBC WITH AUTO DIFFERENTIAL
Absolute Eos #: 0.28 E9/L (ref 0.05–0.50)
Absolute Lymph #: 1.21 E9/L — ABNORMAL LOW (ref 1.50–4.00)
Absolute Mono #: 0.41 E9/L (ref 0.10–0.95)
Absolute Neut #: 3.47 E9/L (ref 1.80–7.30)
Basophils Absolute: 0.02 E9/L (ref 0.00–0.20)
Basophils: 0 % (ref 0–2)
Eosinophils %: 5 % (ref 0–6)
Hematocrit: 45.7 % (ref 34.0–48.0)
Hemoglobin: 15.1 g/dL (ref 11.5–15.5)
Lymphocytes: 23 % (ref 20–42)
MCH: 30.3 pg (ref 26.0–35.0)
MCHC: 33.1 % (ref 32.0–34.5)
MCV: 91.8 fL (ref 80.0–99.9)
MPV: 7.3 fL (ref 7.0–12.0)
Monocytes: 8 % (ref 2–12)
Platelets: 136 E9/L (ref 130–450)
RBC: 4.98 E12/L (ref 3.50–5.50)
RDW: 14.8 fL (ref 11.5–15.0)
Seg Neutrophils: 65 % (ref 43–80)
WBC: 5.4 E9/L (ref 4.5–11.5)

## 2012-04-27 LAB — COMPREHENSIVE METABOLIC PANEL
ALT: 30 U/L (ref 10–49)
AST: 33 U/L (ref 0–33)
Albumin: 4.3 g/dL (ref 3.2–4.8)
Alkaline Phosphatase: 61 U/L (ref 45–129)
BUN: 17 mg/dL (ref 6–20)
CO2: 30 mmol/L (ref 20–31)
Calcium: 9.2 mg/dL (ref 8.6–10.5)
Chloride: 109 mmol/L (ref 99–109)
Creatinine: 0.9 mg/dL (ref 0.5–1.1)
Glucose: 90 mg/dL (ref 70–110)
Potassium: 4.6 mmol/L (ref 3.5–5.5)
Sodium: 145 mmol/L (ref 132–146)
Total Bilirubin: 0.3 mg/dL (ref 0.3–1.2)
Total Protein: 6.8 g/dL (ref 5.7–8.2)

## 2012-04-27 LAB — TSH: TSH: 1.306 u[IU]/mL (ref 0.350–5.000)

## 2012-04-27 LAB — LIPID PANEL
Cholesterol: 189 mg/dL (ref 0–199)
HDL: 86 mg/dL (ref >40.0)
LDL Calculated: 80 mg/dL (ref 0–99)
Triglycerides: 114 mg/dL (ref 0–149)

## 2012-04-27 LAB — HEMOGLOBIN A1C: Hemoglobin A1C: 6 % (ref 4.8–6.0)

## 2012-04-27 LAB — GFR CALCULATED: Gfr Calculated: >60 mL/min/{1.73_m2} (ref >=60)

## 2012-05-17 ENCOUNTER — Telehealth: Payer: Self-pay | Admitting: Family Medicine

## 2012-05-17 ENCOUNTER — Ambulatory Visit (INDEPENDENT_AMBULATORY_CARE_PROVIDER_SITE_OTHER): Payer: BC Managed Care – PPO | Admitting: Family Medicine

## 2012-05-17 ENCOUNTER — Encounter: Payer: Self-pay | Admitting: Family Medicine

## 2012-05-17 VITALS — BP 146/80 | HR 92 | Temp 98.4°F | Wt 156.0 lb

## 2012-05-17 DIAGNOSIS — R079 Chest pain, unspecified: Secondary | ICD-10-CM

## 2012-05-17 DIAGNOSIS — R0789 Other chest pain: Secondary | ICD-10-CM | POA: Insufficient documentation

## 2012-05-17 MED ORDER — OMEPRAZOLE 20 MG PO CPDR: 20.0000 mg | DELAYED_RELEASE_CAPSULE | Freq: Two times a day (BID) | ORAL | Status: DC

## 2012-05-17 NOTE — Telephone Encounter (Signed)
Caller: Chastelyn/Patient; Patient Name: Jacqueline Garza; PCP: Crawford Givens Clelia Croft) [facility]); Best Callback Phone Number: [phone]; pt is calling and states that she started having chest pain that radiated to back; sx started 05/16/12;   pain was also noted to jaw and tongue; left arm was tingling as well;  pain lasted 20-30 minutes; denies any symptoms today; Triaged per Chest Pain Guideline; See in 24 hours due to one or more unexplained pain in jaw, arms and back lasting more than a few minutes that have not been evaluated; appt made for 3:15pm today with Dr Para March; will comply

## 2012-05-17 NOTE — Telephone Encounter (Signed)
Appt scheduled

## 2012-05-17 NOTE — Patient Instructions (Addendum)
Increase the prilosec to twice a day.  Let me know if the anxiety continues.  If so, we'll need to talk.  If you have another episode of pain, then go to the ER.

## 2012-05-17 NOTE — Progress Notes (Signed)
Was on vacation last week. Thursday she got back from the cruise.  She had some nausea that day but it resolved.  Then yesterday, she had anterior chest pain that radiated to the back, pain in R side of jaw, and her tongue felt different than normal.  All lasted about 20-30 min and then self resolved.  The CP was 3-4/10.   Wasn't SOB.  No syncope, no sweats.  Sx started at rest.  No exertional sx.  No sx like this prev except for once during a period of high stress.    She is tearful today and is going to change jobs soon.  She hopes this will be helpful.  She admits to being overwhelmed with work and family concerns.   No CP now.  Nonsmoker, no FH CAD except for grandmother, no personal h/o CAD.    Meds, vitals, and allergies reviewed.   ROS: See HPI.  Otherwise, noncontributory.  GEN: nad, alert and oriented, nearly tearful but regains composure HEENT: mucous membranes moist NECK: supple w/o LA CV: rrr, no murmur, chest wall not ttp PULM: ctab, no inc wob ABD: soft, +bs, no ttp, no rebound EXT: no edema SKIN: no acute rash  EKG reviewed.

## 2012-05-17 NOTE — Telephone Encounter (Signed)
Will see today.  If she has recurrent sx, then to ER.

## 2012-05-17 NOTE — Assessment & Plan Note (Signed)
Came on at rest, self resolved, no exertional sx.  No return of sx.  Known stressors at work and home.  Likely that panic/anxiety causing her sx.  No other obvious cause and I doubt ominous dx (cardiac, pulmonary, vascular source) and those should not have resolved after ~30 min.  EKG reassuring (with sinus tach likely related to anxiety).  She is going to change jobs.  She'll see how this affects her outlook and stress level.   GERD may be a component.  She is on nsaid.  Would inc PPI to BID on the chance this could be playing a role here.  She agrees.

## 2012-05-29 ENCOUNTER — Encounter: Payer: Self-pay | Admitting: Family Medicine

## 2012-11-08 ENCOUNTER — Encounter

## 2012-11-08 NOTE — Telephone Encounter (Signed)
Patient called for medication refill

## 2013-02-11 ENCOUNTER — Encounter

## 2013-02-11 NOTE — Telephone Encounter (Signed)
Patient called for medication refill.

## 2013-03-20 ENCOUNTER — Other Ambulatory Visit: Payer: Self-pay | Admitting: Family Medicine

## 2013-03-22 NOTE — Telephone Encounter (Signed)
Received refill request electronically. Last office visit 05/17/12. Is it okay to refill medication?

## 2013-03-23 NOTE — Telephone Encounter (Signed)
Need CPE scheduled.

## 2013-03-23 NOTE — Telephone Encounter (Signed)
Left message on machine that pt would need to be seen for further refills

## 2013-03-28 ENCOUNTER — Encounter

## 2013-03-28 NOTE — Telephone Encounter (Signed)
Patient called for medication refill.

## 2013-04-01 ENCOUNTER — Encounter

## 2013-04-01 NOTE — Telephone Encounter (Signed)
Patient called for medication refill.

## 2013-04-13 ENCOUNTER — Encounter

## 2013-04-13 NOTE — Telephone Encounter (Signed)
Patient called for medication refill.

## 2013-04-27 NOTE — Patient Instructions (Signed)
Neck Arthritis: Exercises  Your Care Instructions  Here are some examples of typical rehabilitation exercises for your condition. Start each exercise slowly. Ease off the exercise if you start to have pain.  Your doctor or physical therapist will tell you when you can start these exercises and which ones will work best for you.  How to do the exercises  Neck stretches to the side    1. This stretch works best if you keep your shoulder down as you lean away from it. To help you remember to do this, start by relaxing your shoulders and lightly holding on to your thighs or your chair.  2. Tilt your head toward your shoulder and hold for 15 to 30 seconds. Let the weight of your head stretch your muscles.  3. Repeat 2 to 4 times toward each shoulder.  Chin tuck    1. Lie on the floor with a rolled-up towel under your neck. Your head should be touching the floor.  2. Slowly bring your chin toward your chest.  3. Hold for a count of 6, and then relax for up to 10 seconds.  4. Repeat 8 to 12 times.  Active cervical rotation    1. Sit in a firm chair, or stand up straight.  2. Keeping your chin level, turn your head to the right, and hold for 15 to 30 seconds.  3. Turn your head to the left and hold for 15 to 30 seconds.  4. Repeat 2 to 4 times to each side.  Shoulder blade squeeze    1. While standing, squeeze your shoulder blades together.  2. Do not raise your shoulders up as you are squeezing.  3. Hold for 6 seconds.  4. Repeat 8 to 12 times.  Shoulder rolls    1. Sit comfortably with your feet shoulder-width apart. You can also do this exercise standing up.  2. Roll your shoulders up, then back, and then down in a smooth, circular motion.  3. Repeat 2 to 4 times.  Follow-up care is a key part of your treatment and safety. Be sure to make and go to all appointments, and call your doctor if you are having problems. It's also a good idea to know your test results and keep a list of the medicines you take.   Where can you  learn more?   Go to [email or web address] and sign in to your MyChart account. Enter [phone] in the Search Health Information box to learn more about ???Neck Arthritis: Exercises.???    If you do not have an account, please click on the ???Sign Up Now??? link.     ?? 2006-2014 [employer]. Care instructions adapted under license by [facility]. This care instruction is for use with your licensed healthcare professional. If you have questions about a medical condition or this instruction, always ask your healthcare professional. Healthwise, Incorporated disclaims any warranty or liability for your use of this information.  Content Version: 10.1.311062; Current as of: June 04, 2012              Neck Spasm: Exercises  Your Care Instructions  Here are some examples of typical rehabilitation exercises for your condition. Start each exercise slowly. Ease off the exercise if you start to have pain.  Your doctor or physical therapist will tell you when you can start these exercises and which ones will work best for you.  How to do the exercises  Levator scapula stretch    1. Sit  in a firm chair, or stand up straight.  2. Gently tilt your head toward your left shoulder.  3. Turn your head to look down into your armpit, bending your head slightly forward. Let the weight of your head stretch your neck muscles.  4. Hold for 15 to 30 seconds.  5. Return to your starting position.  6. Follow the same instructions above, but tilt your head toward your right shoulder.  7. Repeat 2 to 4 times toward each shoulder.  Upper trapezius stretch    1. Sit in a firm chair, or stand up straight.  2. This stretch works best if you keep your shoulder down as you lean away from it. To help you remember to do this, start by relaxing your shoulders and lightly holding on to your thighs or your chair.  3. Tilt your head toward your shoulder and hold for 15 to 30 seconds. Let the weight of your head stretch your  muscles.  4. If you would like a little added stretch, place your arm behind your back. Use the arm opposite of the direction you are tilting your head. For example, if you are tilting your head to the left, place your right arm behind your back.  5. Repeat 2 to 4 times toward each shoulder.  Neck rotation    1. Sit in a firm chair, or stand up straight.  2. Keeping your chin level, turn your head to the right, and hold for 15 to 30 seconds.  3. Turn your head to the left, and hold for 15 to 30 seconds.  4. Repeat 2 to 4 times to each side.  Chin tuck    1. Lie on the floor with a rolled-up towel under your neck. Your head should be touching the floor.  2. Slowly bring your chin toward the front of your neck.  3. Hold for a count of 6, and then relax for up to 10 seconds.  4. Repeat 8 to 12 times.  Forward neck flexion    1. Sit in a firm chair, or stand up straight.  2. Bend your head forward.  3. Hold for 15 to 30 seconds, then return to your starting position.  4. Repeat 2 to 4 times.  Follow-up care is a key part of your treatment and safety. Be sure to make and go to all appointments, and call your doctor if you are having problems. It's also a good idea to know your test results and keep a list of the medicines you take.   Where can you learn more?   Go to [email or web address] and sign in to your MyChart account. Enter [id number] in the Search Health Information box to learn more about ???Neck Spasm: Exercises.???    If you do not have an account, please click on the ???Sign Up Now??? link.     ?? 2006-2014 [employer]wise, [employer]. Care instructions adapted under license by [facility]. This care instruction is for use with your licensed healthcare professional. If you have questions about a medical condition or this instruction, always ask your healthcare professional. Healthwise, Inc[employer] disclaims any warranty or liability for your use of this information.  Content Version:  10.1.311062; Current as of: June 04, 2012

## 2013-04-27 NOTE — Progress Notes (Signed)
SUBJECTIVE  Jane Romero is a 52 y.o. female.    HPI/Chief C/O:  Chief Complaint   Patient presents with   ??? Annual Exam   ??? Medication Refill     Allergies   Allergen Reactions   ??? Codeine Itching     She is here for a med list review and to set up labs. There is still a lot of pain in her neck that goes down into both arms  ROS:  Review of Systems   Constitutional: Negative.    HENT: Positive for neck pain.    Eyes: Negative for blurred vision, double vision, photophobia, pain, discharge and redness.   Respiratory: Negative for cough, hemoptysis, sputum production, shortness of breath and wheezing.    Cardiovascular: Negative for chest pain, palpitations, orthopnea, claudication, leg swelling and PND.   Gastrointestinal: Negative.    Genitourinary: Negative.    Musculoskeletal: Negative for myalgias, back pain, joint pain and falls.   Skin: Negative.    Neurological: Negative for dizziness, tingling, tremors, sensory change, speech change, focal weakness, seizures and loss of consciousness.   Psychiatric/Behavioral: Positive for depression.       Past Medical/Surgical Hx;  Reviewed with patient  History reviewed. No pertinent past medical history.  History reviewed. No pertinent past surgical history.    Past Family Hx:  Reviewed with patient  History reviewed. No pertinent family history.    Social Hx:  Reviewed with patient  History   Substance Use Topics   ??? Smoking status: Current Every Day Smoker -- 0.25 packs/day     Types: Cigarettes   ??? Smokeless tobacco: Never Used   ??? Alcohol Use: No       OBJECTIVE  BP 120/70   Pulse 76   Ht 5\' 6"  (1.676 m)   Wt 109 lb (49.442 kg)   BMI 17.6 kg/m2   Breastfeeding? No    Problem List:  Jane Romero has Depression; Osteoarthritis; and Herpes genitalis in women on her problem list.    PHYS EX:  Physical Exam   Constitutional: She is oriented to person, place, and time. She appears well-developed and well-nourished.   HENT:   Head: Normocephalic and atraumatic.   Right Ear:  External ear normal.   Left Ear: External ear normal.   Nose: Nose normal.   Mouth/Throat: Oropharynx is clear and moist.   Eyes: Conjunctivae and EOM are normal. Pupils are equal, round, and reactive to light. Right eye exhibits no discharge.   Neck: Normal range of motion. Neck supple. No thyromegaly present.   Cardiovascular: Normal rate, regular rhythm and normal heart sounds.  Exam reveals no gallop and no friction rub.    No murmur heard.  Pulmonary/Chest: Effort normal and breath sounds normal. No respiratory distress. She has no wheezes. She has no rales. She exhibits no tenderness.   Abdominal: Soft. Bowel sounds are normal. She exhibits no distension.   Musculoskeletal: She exhibits tenderness. She exhibits no edema.   There is pain and spasm to the right and left cervical paravertebral muscle masses    Neurological: She is alert and oriented to person, place, and time. She has normal reflexes. She displays normal reflexes. No cranial nerve deficit. She exhibits normal muscle tone. Coordination normal.   Skin: Skin is warm. No rash noted. No erythema. No pallor.   Nursing note and vitals reviewed.      ASSESSMENT/PLAN  Jane Romero was seen today for annual exam and medication refill.    Diagnoses and associated  orders for this visit:    Osteoarthritis  - ibuprofen (ADVIL;MOTRIN) 800 MG tablet; Take 1 tablet by mouth every 8 hours as needed.    Depression  - buPROPion (WELLBUTRIN XL) 300 MG XL tablet; Take 1 tablet by mouth every morning.    Herpes genitalis in women  - famciclovir (FAMVIR) 500 MG tablet; Take 1 tablet by mouth daily.    IFG (impaired fasting glucose)  - Comprehensive Metabolic Panel; Future  - CBC Auto Differential; Future  - Hemoglobin A1C; Future    Dyslipidemia  - Comprehensive Metabolic Panel; Future  - Lipid Panel; Future  - CBC Auto Differential; Future    Fatigue  - TSH without Reflex; Future          Outpatient Encounter Prescriptions as of 04/27/2013   Medication Sig Dispense Refill   ???  buPROPion (WELLBUTRIN XL) 300 MG XL tablet Take 1 tablet by mouth every morning.  30 tablet  5   ??? famciclovir (FAMVIR) 500 MG tablet Take 1 tablet by mouth daily.  30 tablet  5   ??? ibuprofen (ADVIL;MOTRIN) 800 MG tablet Take 1 tablet by mouth every 8 hours as needed.  270 tablet  1   ??? busPIRone (BUSPAR) 5 MG tablet Take 1 tablet by mouth 2 times daily.  60 tablet  3   ??? [DISCONTINUED] famciclovir (FAMVIR) 500 MG tablet Take 1 tablet by mouth daily.  30 tablet  0   ??? [DISCONTINUED] buPROPion (WELLBUTRIN XL) 300 MG XL tablet Take 1 tablet by mouth every morning.  30 tablet  0   ??? [DISCONTINUED] ibuprofen (ADVIL;MOTRIN) 800 MG tablet Take 1 tablet by mouth every 8 hours as needed.  270 tablet  1     No facility-administered encounter medications on file as of 04/27/2013.       No Follow-up on file.    Reviewed recent labs related to [facility] current problems      Discussed importance of regular Health Maintenance follow up  Health Maintenance   Topic   ??? BREAST CANCER SCREENING    ??? FLU VACCINE YEARLY (ADULT)    ??? CERVICAL CANCER SCREENING    ??? TETANUS VACCINE ADULT (11 YEARS AND UP)    ??? COLON CANCER SCREENING COLONOSCOPY        PLAN--inject right and left C7 x 2                1/2 cc xylocaine plus 1 cc depo medrol                Omt/clean/dress

## 2013-08-29 ENCOUNTER — Ambulatory Visit (INDEPENDENT_AMBULATORY_CARE_PROVIDER_SITE_OTHER): Payer: BC Managed Care – PPO | Admitting: Family Medicine

## 2013-08-29 ENCOUNTER — Encounter: Payer: Self-pay | Admitting: Family Medicine

## 2013-08-29 VITALS — BP 140/86 | HR 84 | Temp 98.2°F | Wt 146.5 lb

## 2013-08-29 DIAGNOSIS — F411 Generalized anxiety disorder: Secondary | ICD-10-CM

## 2013-08-29 MED ORDER — HYDROXYZINE HCL 50 MG PO TABS: ORAL_TABLET | ORAL | Status: DC

## 2013-08-29 NOTE — Patient Instructions (Signed)
Use the hydroxyzine as needed (it can make you drowsy) and Rosaria Ferries will call about your referral to Dr. Rexene Edison.  Take care.   Give me an update as needed.

## 2013-08-29 NOTE — Progress Notes (Signed)
Pre-visit discussion using our clinic review tool. No additional management support is needed unless otherwise documented below in the visit note.  Anxiety.  04/2013- daughter was getting married and there was verbal discord with the in-laws.  Pt's daughter's mother-in-law to be was drunk and the patient wasn't appreciative of the behavior.  "And I was anxious before that."  Noted that her daughter is doing well, pt's son in law is supportive.  She got through the wedding. She continues on cymbalta in the meantime.  No ADE.    Her nephew died unexpectedly, before the wedding above.   Found dead, had been down for ~2 days.  Pt reports it was possibly drug related.  Grandmother is in rehab facility and they don't have a good relationship.   She has quit her job to be at home, and that has been helpful.  In spite of that, she can't sleep. Worry, hesitant about going out of the home alone.  Insomnia, then trouble getting back to sleep, waking up with "my stomach in knots."  No Si/Hi.    "I like to take care of everybody else."    She just got a jury duty summons.  Discussed.    No illicits, no etoh.  Safe at home. She feels some better after exercise, walking mainly.    No meds tried, other than topical lavender.    Meds, vitals, and allergies reviewed.   ROS: See HPI.  Otherwise, noncontributory.  GEN: nad, alert and oriented HEENT: mucous membranes moist NECK: supple w/o LA CV: rrr.  no murmur PULM: ctab, no inc wob ABD: soft, +bs Speech wnl.  Judgement appears intact

## 2013-08-30 DIAGNOSIS — F411 Generalized anxiety disorder: Secondary | ICD-10-CM | POA: Insufficient documentation

## 2013-08-30 NOTE — Assessment & Plan Note (Signed)
Okay for outpatient f/u.  D/w pt.  She agrees. Will refer for counseling, can use atarax prn in meantime.  Continue cymbalta. She agrees.

## 2013-09-05 ENCOUNTER — Ambulatory Visit: Payer: BC Managed Care – PPO | Admitting: Family Medicine

## 2013-09-05 ENCOUNTER — Ambulatory Visit (INDEPENDENT_AMBULATORY_CARE_PROVIDER_SITE_OTHER): Payer: BC Managed Care – PPO | Admitting: Internal Medicine

## 2013-09-05 ENCOUNTER — Encounter: Payer: Self-pay | Admitting: Internal Medicine

## 2013-09-05 VITALS — BP 118/74 | HR 72 | Temp 98.0°F | Wt 147.8 lb

## 2013-09-05 DIAGNOSIS — L0201 Cutaneous abscess of face: Secondary | ICD-10-CM

## 2013-09-05 DIAGNOSIS — L03211 Cellulitis of face: Principal | ICD-10-CM

## 2013-09-05 MED ORDER — SULFAMETHOXAZOLE-TMP DS 800-160 MG PO TABS: 1.0000 | ORAL_TABLET | Freq: Two times a day (BID) | ORAL | Status: DC

## 2013-09-05 NOTE — Progress Notes (Signed)
Pre-visit discussion using our clinic review tool. No additional management support is needed unless otherwise documented below in the visit note.  

## 2013-09-05 NOTE — Progress Notes (Signed)
Subjective:    Patient ID: Jacqueline Garza, female    DOB: [birth year], 53 y.o.   MRN: [id number]  HPI  Pt presents to the clinic today with c/o a bump on her face. This started 2 days ago. She reports that it was raised and sore. She reports that it has drained pus. The bump has gotten smaller but she does notice swelling of her jaw and neck. She denies difficulty breathing.  Review of Systems      Past Medical History  Diagnosis Date  . Depression   . Rheumatoid arthritis(714.0)     with prev MTX intolerance  . Edema     with occ HCTZ use  . 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 diclofenac (VOLTAREN) 50 MG EC tablet Take 50 mg by mouth 3 (three) times daily as needed.      . DULoxetine (CYMBALTA) 30 MG capsule TAKE ONE CAPSULE BY MOUTH EVERY DAY TAKE WITH 60MG  CAPSULE FOR TOTAL OF 90 MGS  90 capsule  0  . DULoxetine (CYMBALTA) 60 MG capsule TAKE 1 CAPSULE BY MOUTH DAILY  90 capsule  0  . gabapentin (NEURONTIN) 100 MG capsule Take 100 mg by mouth 3 (three) times daily.      . hydrOXYzine (ATARAX/VISTARIL) 50 MG tablet Take 25-50mg  every 8 hours as needed for anxiety or insomnia.  30 tablet  1  . omeprazole (PRILOSEC) 20 MG capsule Take 20 mg by mouth daily as needed.      . sulfamethoxazole-trimethoprim (BACTRIM DS) 800-160 MG per tablet Take 1 tablet by mouth 2 (two) times daily.  14 tablet  0   No current facility-administered medications for this visit.    Allergies  Allergen Reactions  . Methotrexate Derivatives     Elevated liver test    Family History  Problem Relation Age of Onset  . Hypertension Father   . Leukemia Sister   . Leukemia Sister     History   Social History  . Marital Status: Married    Spouse Name: N/A    Number of Children: 2  . Years of Education: N/A   Occupational History  . 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 Smoking status:  Never Smoker   . Smokeless tobacco: Never Used  . Alcohol Use: Yes     Comment: rare  . Drug Use: No  . Sexual Activity: Not on file   Other Topics Concern  . Not on file   Social History Narrative   Married 1978     Constitutional: Denies fever, malaise, fatigue, headache or abrupt weight changes.   Skin: Pt reports abscess on face. Denies  rashes, or ulcercations.    No other specific complaints in a complete review of systems (except as listed in HPI above).  Objective:   Physical Exam  BP 118/74  Pulse 72  Temp(Src) 98 F (36.7 C) (Oral)  Wt 147 lb 12.8 oz (67.042 kg)  SpO2 98%  LMP 08/19/2010 Wt Readings from Last 3 Encounters:  09/05/13 147 lb 12.8 oz (67.042 kg)  08/29/13 146 lb 8 oz (66.452 kg)  05/17/12 156 lb (70.761 kg)    General: Appears their stated age, well developed, well nourished in NAD. Skin: Warm, dry and intact. Small abscess noted on left side of face, Erythema noted halfway down the neck. Cardiovascular: Normal rate and  rhythm. S1,S2 noted.  No murmur, rubs or gallops noted. No JVD or BLE edema. No carotid bruits noted. Pulmonary/Chest: Normal effort and positive vesicular breath sounds. No respiratory distress. No wheezes, rales or ronchi noted.          Assessment & Plan:   Abscess of face:  eRx for Septra DS BUD x 7 day Warm compress to area TID to encourage all the abscess to drain Ibuprofen OTC for pain/discomfort  RTC as needed or if abscess worsens

## 2013-09-05 NOTE — Patient Instructions (Addendum)
Abscess  Care After  An abscess (also called a boil or furuncle) is an infected area that contains a collection of pus. Signs and symptoms of an abscess include pain, tenderness, redness, or hardness, or you may feel a moveable soft area under your skin. An abscess can occur anywhere in the body. The infection may spread to surrounding tissues causing cellulitis. A cut (incision) by the surgeon was made over your abscess and the pus was drained out. Gauze may have been packed into the space to provide a drain that will allow the cavity to heal from the inside outwards. The boil may be painful for 5 to 7 days. Most people with a boil do not have high fevers. Your abscess, if seen early, may not have localized, and may not have been lanced. If not, another appointment may be required for this if it does not get better on its own or with medications.  HOME CARE INSTRUCTIONS   · Only take over-the-counter or prescription medicines for pain, discomfort, or fever as directed by your caregiver.  · When you bathe, soak and then remove gauze or iodoform packs at least daily or as directed by your caregiver. You may then wash the wound gently with mild soapy water. Repack with gauze or do as your caregiver directs.  SEEK IMMEDIATE MEDICAL CARE IF:   · You develop increased pain, swelling, redness, drainage, or bleeding in the wound site.  · You develop signs of generalized infection including muscle aches, chills, fever, or a general ill feeling.  · An oral temperature above 102° F (38.9° C) develops, not controlled by medication.  See your caregiver for a recheck if you develop any of the symptoms described above. If medications (antibiotics) were prescribed, take them as directed.  Document Released: 01/23/2005 Document Revised: 09/29/2011 Document Reviewed: 09/20/2007  ExitCare® Patient Information ©2014 [employer].

## 2013-09-19 ENCOUNTER — Telehealth: Payer: Self-pay

## 2013-09-19 ENCOUNTER — Other Ambulatory Visit: Payer: Self-pay | Admitting: Internal Medicine

## 2013-09-19 DIAGNOSIS — L0201 Cutaneous abscess of face: Secondary | ICD-10-CM

## 2013-09-19 DIAGNOSIS — L03211 Cellulitis of face: Principal | ICD-10-CM

## 2013-09-19 MED ORDER — SULFAMETHOXAZOLE-TMP DS 800-160 MG PO TABS: 1.0000 | ORAL_TABLET | Freq: Two times a day (BID) | ORAL | Status: DC

## 2013-09-19 NOTE — Telephone Encounter (Signed)
Will refill, but if worsens will need to be seen

## 2013-09-19 NOTE — Telephone Encounter (Signed)
Pt left v/m was seen 09/05/13; dx with cellulitis on face; pt took antibiotic and face almost completely cleared but this past weekend pt noticed bump on face coming back again, bump is red, not draining pus now, no fever;pt request refill of antibiotic or will pt need to be seen; no available appts today.[facility]. If condition changes or worsens prior to cb pt will call our office.

## 2013-09-20 NOTE — Telephone Encounter (Signed)
lmovm for pt to return call.  

## 2013-09-21 NOTE — Telephone Encounter (Signed)
Pt returned call--i did instruct to f/u if Sx got worse or stayed the same--pt is aware as instructed

## 2013-09-27 ENCOUNTER — Encounter: Payer: Self-pay | Admitting: Internal Medicine

## 2013-09-27 ENCOUNTER — Ambulatory Visit (INDEPENDENT_AMBULATORY_CARE_PROVIDER_SITE_OTHER): Payer: BC Managed Care – PPO | Admitting: Internal Medicine

## 2013-09-27 VITALS — BP 108/76 | HR 78 | Temp 98.2°F

## 2013-09-27 DIAGNOSIS — L03211 Cellulitis of face: Principal | ICD-10-CM

## 2013-09-27 DIAGNOSIS — L0201 Cutaneous abscess of face: Secondary | ICD-10-CM

## 2013-09-27 NOTE — Progress Notes (Signed)
Subjective:    Patient ID: Jacqueline Garza, female    DOB: 01-10-1961, 53 y.o.   MRN: [id number]  HPI  Pt presents to the clinic today to recheck cellulitis on face. She reports she has finished the course of Bactrim 2 days ago. She reports the swelling has gone down but the abscess has not completely gone away. She denies fever, redness or warmth around the abscess.  Review of Systems      Past Medical History  Diagnosis Date  . Depression   . Rheumatoid arthritis(714.0)     with prev MTX intolerance  . Edema     with occ HCTZ use  . 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 diclofenac (VOLTAREN) 50 MG EC tablet Take 50 mg by mouth 3 (three) times daily as needed.      . DULoxetine (CYMBALTA) 30 MG capsule TAKE ONE CAPSULE BY MOUTH EVERY DAY TAKE WITH 60MG  CAPSULE FOR TOTAL OF 90 MGS  90 capsule  0  . DULoxetine (CYMBALTA) 60 MG capsule TAKE 1 CAPSULE BY MOUTH DAILY  90 capsule  0  . gabapentin (NEURONTIN) 100 MG capsule Take 100 mg by mouth 3 (three) times daily.      . hydrOXYzine (ATARAX/VISTARIL) 50 MG tablet Take 25-50mg  every 8 hours as needed for anxiety or insomnia.  30 tablet  1  . omeprazole (PRILOSEC) 20 MG capsule Take 20 mg by mouth daily as needed.       No current facility-administered medications for this visit.    Allergies  Allergen Reactions  . Methotrexate Derivatives     Elevated liver test    Family History  Problem Relation Age of Onset  . Hypertension Father   . Leukemia Sister   . Leukemia Sister     History   Social History  . Marital Status: Married    Spouse Name: N/A    Number of Children: 2  . Years of Education: N/A   Occupational History  . 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 Smoking status: Never Smoker   . Smokeless tobacco: Never Used  . Alcohol Use: Yes     Comment: rare  . Drug Use: No  . Sexual Activity: Not on file    Other Topics Concern  . Not on file   Social History Narrative   Married 1978     Constitutional: Denies fever, malaise, fatigue, headache or abrupt weight changes.  Skin: Pt reports small abscess on left chin. Denies redness, rashes, or ulcercations.    No other specific complaints in a complete review of systems (except as listed in HPI above).  Objective:   Physical Exam   BP 108/76  Pulse 78  Temp(Src) 98.2 F (36.8 C) (Oral)  SpO2 99%  LMP 08/19/2010 Wt Readings from Last 3 Encounters:  09/05/13 147 lb 12.8 oz (67.042 kg)  08/29/13 146 lb 8 oz (66.452 kg)  05/17/12 156 lb (70.761 kg)    General: Appears her stated age, well developed, well nourished in NAD. Skin: Warm, dry and intact. No rashes, lesions or ulcerations noted. Small patch of acne located beside corner of mouth. No evidence of abscess or cellulitis. Cardiovascular: Normal rate and rhythm. S1,S2 noted.  No murmur, rubs or gallops noted. No JVD or BLE edema. No carotid bruits noted. Pulmonary/Chest: Normal effort and positive vesicular breath sounds.  No respiratory distress. No wheezes, rales or ronchi noted.    CBC    Component Value Date/Time   WBC 12.9* 01/26/2009 2253   RBC 4.25 01/26/2009 2253   HGB 12.0 01/26/2009 2253   HCT 35.5* 01/26/2009 2253   PLT 313 01/26/2009 2253   MCV 83.5 01/26/2009 2253   MCHC 33.8 01/26/2009 2253   RDW 13.6 01/26/2009 2253   LYMPHSABS 1.5 01/26/2009 2253   MONOABS 0.9 01/26/2009 2253   EOSABS 0.2 01/26/2009 2253   BASOSABS 0.0 01/26/2009 2253         Assessment & Plan:   Cellulitis and abscess of abscess of face, resolved:  No indication for abx Continue to monitor for swelling, warmth, redness of fever  RTC as needed or if symptoms persist or worsen

## 2013-09-27 NOTE — Progress Notes (Signed)
Pre visit review using our clinic review tool, if applicable. No additional management support is needed unless otherwise documented below in the visit note. 

## 2013-09-27 NOTE — Patient Instructions (Addendum)
Abscess An abscess is an infected area that contains a collection of pus and debris.It can occur in almost any part of the body. An abscess is also known as a furuncle or boil. CAUSES  An abscess occurs when tissue gets infected. This can occur from blockage of oil or sweat glands, infection of hair follicles, or a minor injury to the skin. As the body tries to fight the infection, pus collects in the area and creates pressure under the skin. This pressure causes pain. People with weakened immune systems have difficulty fighting infections and get certain abscesses more often.  SYMPTOMS Usually an abscess develops on the skin and becomes a painful mass that is red, warm, and tender. If the abscess forms under the skin, you may feel a moveable soft area under the skin. Some abscesses break open (rupture) on their own, but most will continue to get worse without care. The infection can spread deeper into the body and eventually into the bloodstream, causing you to feel ill.  DIAGNOSIS  Your caregiver will take your medical history and perform a physical exam. A sample of fluid may also be taken from the abscess to determine what is causing your infection. TREATMENT  Your caregiver may prescribe antibiotic medicines to fight the infection. However, taking antibiotics alone usually does not cure an abscess. Your caregiver may need to make a small cut (incision) in the abscess to drain the pus. In some cases, gauze is packed into the abscess to reduce pain and to continue draining the area. HOME CARE INSTRUCTIONS   Only take over-the-counter or prescription medicines for pain, discomfort, or fever as directed by your caregiver.  If you were prescribed antibiotics, take them as directed. Finish them even if you start to feel better.  If gauze is used, follow your caregiver's directions for changing the gauze.  To avoid spreading the infection:  Keep your draining abscess covered with a  bandage.  Wash your hands well.  Do not share personal care items, towels, or whirlpools with others.  Avoid skin contact with others.  Keep your skin and clothes clean around the abscess.  Keep all follow-up appointments as directed by your caregiver. SEEK MEDICAL CARE IF:   You have increased pain, swelling, redness, fluid drainage, or bleeding.  You have muscle aches, chills, or a general ill feeling.  You have a fever. MAKE SURE YOU:   Understand these instructions.  Will watch your condition.  Will get help right away if you are not doing well or get worse. Document Released: 04/16/2005 Document Revised: 01/06/2012 Document Reviewed: 09/19/2011 [employer] Patient Information 2014 [employer].  

## 2013-10-06 ENCOUNTER — Ambulatory Visit (INDEPENDENT_AMBULATORY_CARE_PROVIDER_SITE_OTHER): Payer: BC Managed Care – PPO | Admitting: Psychology

## 2013-10-06 DIAGNOSIS — F4323 Adjustment disorder with mixed anxiety and depressed mood: Secondary | ICD-10-CM

## 2013-10-10 ENCOUNTER — Other Ambulatory Visit: Payer: Self-pay | Admitting: Family Medicine

## 2013-10-20 ENCOUNTER — Ambulatory Visit (INDEPENDENT_AMBULATORY_CARE_PROVIDER_SITE_OTHER): Payer: BC Managed Care – PPO | Admitting: Psychology

## 2013-10-20 DIAGNOSIS — F4323 Adjustment disorder with mixed anxiety and depressed mood: Secondary | ICD-10-CM

## 2013-11-10 ENCOUNTER — Ambulatory Visit: Payer: BC Managed Care – PPO | Admitting: Psychology

## 2013-11-15 ENCOUNTER — Encounter

## 2013-11-15 MED ORDER — BUPROPION HCL ER (XL) 300 MG PO TB24
300 MG | ORAL_TABLET | Freq: Every morning | ORAL | Status: DC
Start: 2013-11-15 — End: 2013-11-30

## 2013-11-15 NOTE — Telephone Encounter (Signed)
Patient called for medication refill.

## 2013-11-30 MED ORDER — IBUPROFEN 800 MG PO TABS
800 MG | ORAL_TABLET | Freq: Three times a day (TID) | ORAL | Status: DC | PRN
Start: 2013-11-30 — End: 2014-10-27

## 2013-11-30 MED ORDER — FAMCICLOVIR 500 MG PO TABS
500 MG | ORAL_TABLET | Freq: Every day | ORAL | Status: DC
Start: 2013-11-30 — End: 2014-10-27

## 2013-11-30 MED ORDER — BUPROPION HCL ER (XL) 300 MG PO TB24
300 MG | ORAL_TABLET | Freq: Every morning | ORAL | Status: DC
Start: 2013-11-30 — End: 2014-06-20

## 2013-11-30 NOTE — Patient Instructions (Signed)
The Mediterranean Diet: After Your Visit  Your Care Instructions  The Mediterranean diet features foods eaten in [place], [place], southern [place] and [place], and other countries that border the [place] Sea. It emphasizes eating a diet rich in fruits, vegetables, nuts, and high-fiber grains, and limits meat, cheese, and sweets.  The Mediterranean diet may:  ?? Prevent heart disease and lower the risk of a heart attack or stroke.  ?? Prevent type 2 diabetes.  ?? Prevent Alzheimer's disease and other dementia.  ?? Prevent depression.  ?? Prevent Parkinson's disease.  This diet contains more fat than other heart-healthy diets. But the fats are mainly from nuts, unsaturated oils, such as fish oils, olive oil, and certain nut or seed oils (such as canola, soybean, or flaxseed oil). These types of oils may help protect the heart and blood vessels.  Follow-up care is a key part of your treatment and safety. Be sure to make and go to all appointments, and call your doctor if you are having problems. It's also a good idea to know your test results and keep a list of the medicines you take.  How can you care for yourself at home?  What to eat  ?? Eat a variety of fruits and vegetables each day, such as grapes, blueberries, tomatoes, broccoli, peppers, figs, olives, spinach, eggplant, beans, lentils, and chickpeas.  ?? Eat a variety of whole-grain foods each day, such as oats, brown rice, and whole wheat bread, pasta, and couscous.  ?? Eat fish at least 2 times a week. Try tuna, salmon, mackerel, lake trout, herring, or sardines.  ?? Eat moderate amounts of low-fat dairy products each day or weekly, such as milk, cheese, or yogurt.  ?? Eat moderate amounts of poultry and eggs every 2 days or weekly.  ?? Choose healthy (unsaturated) fats, such as nuts, olive oil and certain nut or seed oils like canola, soybean, and flaxseed.  ?? Limit unhealthy (saturated) fats, such as butter, palm oil, and coconut oil. And limit fats found in animal  products, such as meat and dairy products made with whole milk. Try to eat red meat only a few times a month in very small amounts.  ?? Limit sweets and desserts to only a few times a week. This includes sugar-sweetened drinks like soda.  The Mediterranean diet may also include red wine with your meal--1 glass each day for women and up to 2 glasses a day for men.  Tips for changing your diet  ?? Dip bread in a mix of olive oil and fresh herbs instead of using butter.  ?? Add avocado slices to your sandwich instead of bacon.  ?? Have fish for lunch or dinner instead of red meat. Brush the fish with olive oil, and broil or grill it.  ?? Sprinkle your salad with seeds or nuts instead of cheese.  ?? Cook with olive or canola oil instead of butter or oils that are high in saturated fat.  ?? Switch from 2% milk or whole milk to 1% or fat-free milk.  ?? Dip raw vegetables in a vinaigrette dressing or hummus instead of dips made from mayonnaise or sour cream.  ?? Have a piece of fruit for dessert instead of a piece of cake. Try baked apples, or have some dried fruit.  Part of the Mediterranean diet is being active. Get at least 30 minutes of exercise on most days of the week. Walking is a good choice. You also may want to do other activities,   such as running, swimming, cycling, or playing tennis or team sports.   Where can you learn more?   Go to [email or web address] and sign in to your MyChart account. Enter O407 in the Search Health Information box to learn more about ???The Mediterranean Diet: After Your Visit.???    If you do not have an account, please click on the ???Sign Up Now??? link.     ?? 2006-2015 [employer]. Care instructions adapted under license by [facility]. This care instruction is for use with your licensed healthcare professional. If you have questions about a medical condition or this instruction, always ask your healthcare professional. Health[employer], Incorporated disclaims any warranty  or liability for your use of this information.  Content Version: 10.4.390249; Current as of: June 03, 2013

## 2013-11-30 NOTE — Progress Notes (Signed)
SUBJECTIVE  Mayer MaskerMary Ellen Romero is a 53 y.o. female.    HPI/Chief C/O:  Chief Complaint   Patient presents with   ??? 6 Month Follow-Up   ??? Medication Refill     Allergies   Allergen Reactions   ??? Codeine Itching     She is her for refills and a med list review. She had labs done and will bring me a copy for my review. I will adjust care for future planning  ROS:  Review of Systems   Constitutional: Negative.    HENT: Positive for congestion and sore throat. Negative for ear discharge, ear pain, hearing loss, nosebleeds and tinnitus.    Eyes: Negative for blurred vision, double vision, photophobia, pain, discharge and redness.   Respiratory: Negative for cough, hemoptysis, sputum production, shortness of breath, wheezing and stridor.    Cardiovascular: Negative for chest pain, palpitations, orthopnea, claudication, leg swelling and PND.   Gastrointestinal: Negative for heartburn, nausea, vomiting, abdominal pain, diarrhea, constipation, blood in stool and melena.   Genitourinary: Negative.    Musculoskeletal: Negative.    Skin: Negative.    Neurological: Negative for dizziness, tingling, tremors, sensory change, speech change, focal weakness, seizures, loss of consciousness and headaches.   Endo/Heme/Allergies: Negative for environmental allergies and polydipsia. Does not bruise/bleed easily.   Psychiatric/Behavioral: Negative.        Past Medical/Surgical Hx;  Reviewed with patient  History reviewed. No pertinent past medical history.  History reviewed. No pertinent past surgical history.    Past Family Hx:  Reviewed with patient  History reviewed. No pertinent family history.    Social Hx:  Reviewed with patient  History   Substance Use Topics   ??? Smoking status: Current Every Day Smoker -- 0.25 packs/day     Types: Cigarettes   ??? Smokeless tobacco: Never Used   ??? Alcohol Use: No       OBJECTIVE  BP 126/70    Pulse 73    Wt 111 lb (50.349 kg)    SpO2 98%    Breastfeeding? No       Problem List:  Jane Romero has Depression;  Osteoarthritis; and Herpes genitalis in women on her problem list.    PHYS EX:  Physical Exam   Constitutional: She is oriented to person, place, and time. She appears well-developed and well-nourished. No distress.   HENT:   Head: Normocephalic and atraumatic.   Right Ear: External ear normal.   Left Ear: External ear normal.   Nose: Nose normal.   Mouth/Throat: Oropharynx is clear and moist. No oropharyngeal exudate.   Sinus congestion with a post nasal drip   Eyes: Conjunctivae and EOM are normal. Pupils are equal, round, and reactive to light. Right eye exhibits no discharge. Left eye exhibits no discharge. No scleral icterus.   Neck: Normal range of motion. Neck supple. No JVD present. No tracheal deviation present. No thyromegaly present.   Cardiovascular: Normal rate, regular rhythm and normal heart sounds.  Exam reveals no gallop and no friction rub.    No murmur heard.  Pulmonary/Chest: Effort normal and breath sounds normal. No respiratory distress. She has no wheezes. She has no rales. She exhibits no tenderness.   Abdominal: Soft. Bowel sounds are normal. She exhibits no distension and no mass. There is no tenderness. There is no rebound and no guarding. No hernia.   Musculoskeletal: Normal range of motion. She exhibits no edema or tenderness.   Lymphadenopathy:     She has no cervical  adenopathy.   Neurological: She is alert and oriented to person, place, and time. She has normal reflexes. She displays normal reflexes. No cranial nerve deficit. She exhibits normal muscle tone. Coordination normal.   Skin: Skin is warm. No rash noted. She is not diaphoretic. No erythema. No pallor.   Psychiatric: She has a normal mood and affect. Her behavior is normal.   Nursing note and vitals reviewed.      ASSESSMENT/PLAN  Jane Romero was seen today for 6 month follow-up and medication refill.    Diagnoses and associated orders for this visit:    Depression  - buPROPion (WELLBUTRIN XL) 300 MG XL tablet; Take 1 tablet by  mouth every morning    Herpes genitalis in women  - famciclovir (FAMVIR) 500 MG tablet; Take 1 tablet by mouth daily    Osteoarthritis  - ibuprofen (ADVIL;MOTRIN) 800 MG tablet; Take 1 tablet by mouth every 8 hours as needed    Screen for colon cancer  - POCT occult blood stool; Standing          Outpatient Encounter Prescriptions as of 11/30/2013   Medication Sig Dispense Refill   ??? buPROPion (WELLBUTRIN XL) 300 MG XL tablet Take 1 tablet by mouth every morning  90 tablet  1   ??? famciclovir (FAMVIR) 500 MG tablet Take 1 tablet by mouth daily  90 tablet  1   ??? ibuprofen (ADVIL;MOTRIN) 800 MG tablet Take 1 tablet by mouth every 8 hours as needed  270 tablet  1   ??? [DISCONTINUED] buPROPion (WELLBUTRIN XL) 300 MG XL tablet Take 1 tablet by mouth every morning  30 tablet  0   ??? [DISCONTINUED] famciclovir (FAMVIR) 500 MG tablet Take 1 tablet by mouth daily.  30 tablet  5   ??? [DISCONTINUED] ibuprofen (ADVIL;MOTRIN) 800 MG tablet Take 1 tablet by mouth every 8 hours as needed.  270 tablet  1   ??? [DISCONTINUED] busPIRone (BUSPAR) 5 MG tablet Take 1 tablet by mouth 2 times daily.  60 tablet  3     No facility-administered encounter medications on file as of 11/30/2013.       No Follow-up on file.    [facility]      Discussed importance of regular Health Maintenance follow up  Health Maintenance   Topic   ??? FLU VACCINE YEARLY (ADULT)    ??? BREAST CANCER SCREENING    ??? COLON CANCER SCREENING FOBT    ??? CERVICAL CANCER SCREENING    ??? TETANUS VACCINE ADULT (11 YEARS AND UP)

## 2013-12-05 DIAGNOSIS — R945 Abnormal results of liver function studies: Secondary | ICD-10-CM | POA: Insufficient documentation

## 2013-12-05 DIAGNOSIS — D649 Anemia, unspecified: Secondary | ICD-10-CM | POA: Insufficient documentation

## 2013-12-07 LAB — POCT OCCULT BLOOD STOOL COLON CA SCREEN 1-3
Occult Blood Fecal: NEGATIVE
Occult Blood Fecal: NEGATIVE
Occult Blood Fecal: NEGATIVE

## 2014-01-15 ENCOUNTER — Other Ambulatory Visit: Payer: Self-pay | Admitting: Family Medicine

## 2014-01-15 DIAGNOSIS — Z13220 Encounter for screening for lipoid disorders: Secondary | ICD-10-CM

## 2014-01-15 DIAGNOSIS — D3000 Benign neoplasm of unspecified kidney: Secondary | ICD-10-CM

## 2014-01-17 ENCOUNTER — Other Ambulatory Visit (INDEPENDENT_AMBULATORY_CARE_PROVIDER_SITE_OTHER): Payer: BC Managed Care – PPO

## 2014-01-17 DIAGNOSIS — Z13220 Encounter for screening for lipoid disorders: Secondary | ICD-10-CM

## 2014-01-17 DIAGNOSIS — D3000 Benign neoplasm of unspecified kidney: Secondary | ICD-10-CM

## 2014-01-23 ENCOUNTER — Encounter: Payer: Self-pay | Admitting: Family Medicine

## 2014-01-23 ENCOUNTER — Ambulatory Visit (INDEPENDENT_AMBULATORY_CARE_PROVIDER_SITE_OTHER): Payer: BC Managed Care – PPO | Admitting: Family Medicine

## 2014-01-23 VITALS — BP 142/84 | HR 81 | Temp 98.1°F | Ht 61.0 in | Wt 145.5 lb

## 2014-01-23 DIAGNOSIS — Z7189 Other specified counseling: Secondary | ICD-10-CM

## 2014-01-23 DIAGNOSIS — Z23 Encounter for immunization: Secondary | ICD-10-CM

## 2014-01-23 DIAGNOSIS — Z1211 Encounter for screening for malignant neoplasm of colon: Secondary | ICD-10-CM

## 2014-01-23 DIAGNOSIS — D3000 Benign neoplasm of unspecified kidney: Secondary | ICD-10-CM

## 2014-01-23 DIAGNOSIS — F329 Major depressive disorder, single episode, unspecified: Secondary | ICD-10-CM

## 2014-01-23 DIAGNOSIS — Z0000 Encounter for general adult medical examination without abnormal findings: Secondary | ICD-10-CM

## 2014-01-23 DIAGNOSIS — F3289 Other specified depressive episodes: Secondary | ICD-10-CM

## 2014-01-23 MED ORDER — DULOXETINE HCL 60 MG PO CPEP: ORAL_CAPSULE | ORAL | Status: DC

## 2014-01-23 NOTE — Assessment & Plan Note (Signed)
Routine anticipatory guidance given to patient.  See health maintenance. Tetanus due, d/w pt.   Flu shot encouraged.   PNA and shingles shot not due.   Mammogram due.  D/w pt.  She'll call about that.  No masses or changes noted by patient.  DXA not due.  D/w pt.   Last pap smear about 3 years ago.  She'll check with gyn about that.  Diet and exercise d/w pt.  Encouraged both.  "I could do better."  She isn't consistent with either.  Diet is variable. D/w patient [id number] for colon cancer screening, including IFOB vs. colonoscopy.  Risks and benefits of both were discussed and patient voiced understanding.  Pt elects for: IFOB.   Living will d/w pt.  Husband designated if patient were incapacitated.  Labs d/w pt.  TG elevation noted, d/w pt about diet and exercise.

## 2014-01-23 NOTE — Assessment & Plan Note (Signed)
Living will d/w pt.  Husband designated if patient were incapacitated.  

## 2014-01-23 NOTE — Addendum Note (Signed)
Addended by: Josetta Huddle on: 01/23/2014 09:42 AM   Modules accepted: Orders

## 2014-01-23 NOTE — Assessment & Plan Note (Signed)
Not using hydroxyzine for sleep now, but it did help prev. Happy about a grandbaby recently born. Mood is fair, some days are better than others. In a week, she'll have "variable" amounts of bad days, ~2 per week, but manageable. No SI/HI. Contracts for safety. Still on cymbalta and in counseling. She agrees to continue with counseling and meds as is. She is improved from her prev lows.

## 2014-01-23 NOTE — Progress Notes (Signed)
Pre visit review using our clinic review tool, if applicable. No additional management support is needed unless otherwise documented below in the visit note.  CPE- See plan.  Routine anticipatory guidance given to patient.  See health maintenance. Tetanus due, d/w pt.   Flu shot encouraged.   PNA and shingles shot not due.   Mammogram due.  D/w pt.  She'll call about that.  No masses or changes noted by patient.  DXA not due.  D/w pt.   Last pap smear about 3 years ago.  She'll check with gyn about that.  Diet and exercise d/w pt.  Encouraged both.  "I could do better."  She isn't consistent with either.  Diet is variable. D/w patient MG:[id number] for colon cancer screening, including IFOB vs. colonoscopy.  Risks and benefits of both were discussed and patient voiced understanding.  Pt elects for: IFOB.   Living will d/w pt.  Husband designated if patient were incapacitated.  Labs d/w pt.  TG elevation noted, d/w pt about diet and exercise.    Depression.  D/w pt.  Not using hydroxyzine for sleep now, but it did help prev.  Happy about a grandbaby recently born.  Mood is fair, some days are better than others.  In a week, she'll have "variable" amounts of bad days, ~2 per week, but manageable.  No SI/HI.  Contracts for safety.  Still on cymbalta and in counseling.  She agrees to continue with counseling and meds as is. She is improved from her prev lows.    RA per Dr. Jefm Bryant.    PMH and SH reviewed  Meds, vitals, and allergies reviewed.   ROS: See HPI.  Otherwise negative.    GEN: nad, alert and oriented, affect and speech wnl HEENT: mucous membranes moist NECK: supple w/o LA CV: rrr. PULM: ctab, no inc wob ABD: soft, +bs EXT: no edema SKIN: no acute rash

## 2014-01-23 NOTE — Patient Instructions (Addendum)
Call about a mammogram and a gyn exam.  I would get a flu shot each fall.   Try to get back to exercising on a schedule.  Take care.  Glad to see you.

## 2014-01-31 ENCOUNTER — Other Ambulatory Visit: Payer: Self-pay | Admitting: Family Medicine

## 2014-02-08 ENCOUNTER — Other Ambulatory Visit: Payer: Self-pay | Admitting: Family Medicine

## 2014-02-08 NOTE — Telephone Encounter (Signed)
Received refill request electronically. Medication is no longer on list. Last office visit 01/23/14. Is it okay to refill medication?

## 2014-02-08 NOTE — Telephone Encounter (Signed)
Continue, sent.  Thanks.

## 2014-02-16 ENCOUNTER — Other Ambulatory Visit: Payer: BC Managed Care – PPO

## 2014-02-22 ENCOUNTER — Telehealth: Payer: Self-pay | Admitting: Family Medicine

## 2014-02-22 NOTE — Telephone Encounter (Signed)
Okay to set up at RN visit, just not on Thursday.  Will need f/uRN  visit to have it read.  Tell her I appreciate her volunteering.  Thanks.

## 2014-02-22 NOTE — Telephone Encounter (Signed)
Pt request tb skin test to volunteer at [employer]. Please advise!

## 2014-02-23 NOTE — Telephone Encounter (Signed)
Appointment scheduled.

## 2014-02-24 ENCOUNTER — Ambulatory Visit (INDEPENDENT_AMBULATORY_CARE_PROVIDER_SITE_OTHER): Payer: BC Managed Care – PPO

## 2014-02-24 ENCOUNTER — Telehealth: Payer: Self-pay

## 2014-02-24 ENCOUNTER — Other Ambulatory Visit (INDEPENDENT_AMBULATORY_CARE_PROVIDER_SITE_OTHER): Payer: BC Managed Care – PPO

## 2014-02-24 DIAGNOSIS — Z111 Encounter for screening for respiratory tuberculosis: Secondary | ICD-10-CM

## 2014-02-24 DIAGNOSIS — Z1211 Encounter for screening for malignant neoplasm of colon: Secondary | ICD-10-CM

## 2014-02-24 LAB — FECAL OCCULT BLOOD, IMMUNOCHEMICAL: FECAL OCCULT BLD: NEGATIVE

## 2014-02-24 NOTE — Telephone Encounter (Signed)
Pt had nurse visit today and received TB skin test; to return on 02/27/14 by 10:15 AM to have read. Pt brought form from [employer] where she will be volunteering to be filled out. Form in Dr Josefine Class in box and pt will pick up form on 02/27/14 when TB skin test read.

## 2014-02-26 NOTE — Telephone Encounter (Signed)
Form done, thanks.  Coming in for PPD reading.

## 2014-02-27 ENCOUNTER — Encounter: Payer: Self-pay | Admitting: *Deleted

## 2014-02-27 LAB — TB SKIN TEST: TB Skin Test: NEGATIVE

## 2014-02-27 NOTE — Telephone Encounter (Signed)
Per Rena ppd test read today and forms given to pt

## 2014-05-05 ENCOUNTER — Other Ambulatory Visit: Payer: Self-pay

## 2014-05-12 ENCOUNTER — Ambulatory Visit
Admit: 2014-05-12 | Discharge: 2014-05-12 | Payer: BLUE CROSS/BLUE SHIELD | Attending: Family Medicine | Primary: Family Medicine

## 2014-05-12 DIAGNOSIS — J329 Chronic sinusitis, unspecified: Secondary | ICD-10-CM

## 2014-05-12 MED ORDER — DEXAMETHASONE SODIUM PHOSPHATE 4 MG/ML IJ SOLN
4 MG/ML | Freq: Once | INTRAMUSCULAR | Status: AC
Start: 2014-05-12 — End: 2014-05-12
  Administered 2014-05-12: 18:00:00 4 mg via INTRAMUSCULAR

## 2014-05-12 MED ORDER — AZITHROMYCIN 250 MG PO TABS
250 MG | PACK | ORAL | Status: AC
Start: 2014-05-12 — End: 2014-05-22

## 2014-05-14 NOTE — Progress Notes (Signed)
SUBJECTIVE  Jane MaskerMary Ellen Romero is a 53 y.o. female.    HPI/Chief C/O:  Chief Complaint   Patient presents with   ??? Nasal Congestion     sinus pressure and drainage x 1 1/2 weeks   ??? Headache   ??? Cough     productive     Allergies   Allergen Reactions   ??? Codeine Itching   this 53 year old female presents with nasal congestion, cephalgia, pharyngitis, and productive cough.    ROS:  Review of Systems   Constitutional: Positive for malaise/fatigue. Negative for fever, chills and weight loss.   HENT: Positive for congestion. Negative for ear discharge, ear pain, hearing loss, nosebleeds and tinnitus.    Eyes: Negative for blurred vision and double vision.   Respiratory: Positive for cough and sputum production. Negative for shortness of breath and stridor.    Cardiovascular: Negative for chest pain and leg swelling.   Gastrointestinal: Positive for nausea. Negative for heartburn, vomiting, abdominal pain, diarrhea, constipation, blood in stool and melena.   Genitourinary: Negative for dysuria, urgency, frequency, hematuria and flank pain.   Musculoskeletal: Negative for back pain, joint pain and neck pain.   Skin: Negative for itching and rash.   Neurological: Positive for headaches. Negative for dizziness.   Psychiatric/Behavioral: Negative for depression, suicidal ideas, hallucinations, memory loss and substance abuse. The patient is not nervous/anxious and does not have insomnia.          OBJECTIVE  BP 112/64 mmHg   Pulse 74   Temp(Src) 98.1 ??F (36.7 ??C)   Wt 110 lb (49.896 kg)   Breastfeeding? No    Problem List:  Jane Romero has Depression; Osteoarthritis; and Herpes genitalis in women on her problem list.    PHYS EX:  Physical Exam   Constitutional: She appears well-developed and well-nourished.   HENT:   Ears- clear. Pharynx- erythema.   Neck: Normal range of motion.   Cardiovascular: Normal rate, regular rhythm and normal heart sounds.    Pulmonary/Chest: Effort normal and breath sounds normal.   Musculoskeletal: Normal  range of motion.   Neurological: She is alert.   Skin: Skin is warm.   Psychiatric: She has a normal mood and affect. Her behavior is normal. Judgment and thought content normal.   Nursing note and vitals reviewed.      ASSESSMENT/PLAN  Jane Romero was seen today for nasal congestion, headache and cough.    Diagnoses and associated orders for this visit:    Sinusitis, unspecified chronicity, unspecified location  - dexamethasone (DECADRON) injection 4 mg; Inject 1 mL into the muscle once  - azithromycin (ZITHROMAX) 250 MG tablet; Take 2 tabs (500 mg) on Day 1, and take 1 tab (250 mg) on days 2 through 5.  Pt instructed to get injection.  Pharyngitis  - dexamethasone (DECADRON) injection 4 mg; Inject 1 mL into the muscle once  - azithromycin (ZITHROMAX) 250 MG tablet; Take 2 tabs (500 mg) on Day 1, and take 1 tab (250 mg) on days 2 through 5.  Pt instructed to take medication.  Chronic fatigue  Instructed pt if not resolved to call and make appointment.        Outpatient Encounter Prescriptions as of 05/12/2014   Medication Sig Dispense Refill   ??? azithromycin (ZITHROMAX) 250 MG tablet Take 2 tabs (500 mg) on Day 1, and take 1 tab (250 mg) on days 2 through 5. 1 packet 0   ??? buPROPion (WELLBUTRIN XL) 300 MG XL tablet Take  1 tablet by mouth every morning 90 tablet 1   ??? famciclovir (FAMVIR) 500 MG tablet Take 1 tablet by mouth daily 90 tablet 1   ??? ibuprofen (ADVIL;MOTRIN) 800 MG tablet Take 1 tablet by mouth every 8 hours as needed 270 tablet 1   ??? [EXPIRED] dexamethasone (DECADRON) injection 4 mg        No facility-administered encounter medications on file as of 05/12/2014.       Return in about 4 weeks (around 06/09/2014).          Reviewed recent labs related to Jane Romero's current problems      Discussed importance of regular Health Maintenance follow up  Health Maintenance   Topic   ??? PNEUMOVAX 1 DOSE 19-64Y (1)   ??? FLU VACCINE YEARLY (ADULT)    ??? BREAST CANCER SCREENING    ??? COLON CANCER SCREENING FOBT    ??? CERVICAL  CANCER SCREENING    ??? TETANUS VACCINE ADULT (11 YEARS AND UP)

## 2014-06-20 MED ORDER — BUPROPION HCL ER (XL) 300 MG PO TB24
300 MG | ORAL_TABLET | ORAL | Status: DC
Start: 2014-06-20 — End: 2014-10-27

## 2014-09-18 ENCOUNTER — Encounter: Payer: Self-pay | Admitting: Family Medicine

## 2014-09-18 ENCOUNTER — Ambulatory Visit (INDEPENDENT_AMBULATORY_CARE_PROVIDER_SITE_OTHER): Payer: BLUE CROSS/BLUE SHIELD | Admitting: Family Medicine

## 2014-09-18 VITALS — BP 108/78 | HR 98 | Temp 99.6°F | Wt 145.2 lb

## 2014-09-18 DIAGNOSIS — R05 Cough: Secondary | ICD-10-CM

## 2014-09-18 DIAGNOSIS — R059 Cough, unspecified: Secondary | ICD-10-CM

## 2014-09-18 DIAGNOSIS — M545 Low back pain, unspecified: Secondary | ICD-10-CM | POA: Insufficient documentation

## 2014-09-18 MED ORDER — GUAIFENESIN-CODEINE 100-10 MG/5ML PO SYRP: 5.0000 mL | ORAL_SOLUTION | Freq: Three times a day (TID) | ORAL | Status: DC | PRN

## 2014-09-18 MED ORDER — CYCLOBENZAPRINE HCL 10 MG PO TABS: 5.0000 mg | ORAL_TABLET | Freq: Three times a day (TID) | ORAL | Status: DC | PRN

## 2014-09-18 NOTE — Progress Notes (Signed)
Pre visit review using our clinic review tool, if applicable. No additional management support is needed unless otherwise documented below in the visit note.  Cough, cold sx, recently with a fever.  Started about 2 weeks ago, got better, then sx restarted in the last few days.  ST.  Sick contacts at home.  No vomiting, no diarrhea, no ear pain.  No rash.    Back had been hurting before she got sick.  Started about 3-4 weeks ago.  Initially pain with certain movements, then worse in the meantime, esp over the weekend.  B lower back, at the belt line.  Using a heating pad with some relief.  Gabapentin helped some.  No radicular leg pain.  Noted spasms in lower back early this AM.  No trauma.  No B/B sx.  No bruising.    Meds, vitals, and allergies reviewed.   ROS: See HPI.  Otherwise, noncontributory.  GEN: nad, alert and oriented HEENT: mucous membranes moist, tm w/o erythema, nasal exam w/o erythema, clear discharge noted,  OP with cobblestoning, sinuses not ttp x4 NECK: supple w/o LA CV: rrr.   PULM: ctab, no inc wob EXT: no edema SKIN: no acute rash Back w/o midline pain, lower back not ttp but pain in B lower back with forward flexion and twisting B.  SLR neg, distally NV intact.

## 2014-09-18 NOTE — Patient Instructions (Signed)
Take ibuprofen with food 2-3 times a day. Use a heating pad and take flexeril for the muscle spasms (sedation caution). Use the cough medicine in the meantime and try to get some rest.  Glad to see you.  Take care.

## 2014-09-18 NOTE — Assessment & Plan Note (Signed)
Nontoxic, with benign exam wouldn't need to start abx.  Use the cough syrup in the meantime with routine cautions and she'll ry to get some rest.  Push fluids.  She agrees.

## 2014-09-18 NOTE — Assessment & Plan Note (Signed)
Likely benign MSK source.  No need to image.  Take ibuprofen with food 2-3 times a day. Use a heating pad and take flexeril for the muscle spasms (sedation caution).  F/u prn.  She agrees.

## 2014-10-27 ENCOUNTER — Ambulatory Visit
Admit: 2014-10-27 | Discharge: 2014-10-27 | Payer: BLUE CROSS/BLUE SHIELD | Attending: Family Medicine | Primary: Family Medicine

## 2014-10-27 DIAGNOSIS — M159 Polyosteoarthritis, unspecified: Secondary | ICD-10-CM

## 2014-10-27 MED ORDER — BUPROPION HCL ER (XL) 300 MG PO TB24
300 MG | ORAL_TABLET | ORAL | Status: DC
Start: 2014-10-27 — End: 2015-05-13

## 2014-10-27 MED ORDER — BUPROPION HCL ER (XL) 300 MG PO TB24
300 MG | ORAL_TABLET | ORAL | Status: DC
Start: 2014-10-27 — End: 2014-10-27

## 2014-10-27 MED ORDER — FAMCICLOVIR 500 MG PO TABS
500 MG | ORAL_TABLET | Freq: Every day | ORAL | Status: DC
Start: 2014-10-27 — End: 2016-01-04

## 2014-10-27 MED ORDER — IBUPROFEN 800 MG PO TABS
800 MG | ORAL_TABLET | Freq: Three times a day (TID) | ORAL | Status: DC | PRN
Start: 2014-10-27 — End: 2016-01-04

## 2014-10-27 NOTE — Progress Notes (Signed)
SUBJECTIVE  Jane Romero is a 54 y.o. female.    HPI/Chief C/O:  Chief Complaint   Patient presents with   ??? 6 Month Follow-Up   ??? Health Maintenance     reviewed , declines pneumo     Allergies   Allergen Reactions   ??? Codeine Itching   she is here for a med list review and refills. She is due for labs but says that she already had some at work. I ask that she please bring them in so that I may review them    ROS:  Review of Systems   Constitutional: Negative.    HENT: Negative for congestion, ear discharge, ear pain, hearing loss, nosebleeds, sore throat and tinnitus.    Eyes: Negative for blurred vision, double vision, photophobia, pain, discharge and redness.   Respiratory: Negative for cough, hemoptysis, sputum production, shortness of breath, wheezing and stridor.    Cardiovascular: Negative for chest pain, palpitations, orthopnea, claudication, leg swelling and PND.   Gastrointestinal: Negative for heartburn, nausea, vomiting, abdominal pain, diarrhea, constipation, blood in stool and melena.   Genitourinary: Negative.    Musculoskeletal: Negative.    Skin: Negative.    Neurological: Negative for dizziness, tingling, tremors, sensory change, speech change, focal weakness, seizures, loss of consciousness and headaches.   Endo/Heme/Allergies: Negative for environmental allergies and polydipsia. Does not bruise/bleed easily.   Psychiatric/Behavioral: Negative.        Past Medical/Surgical Hx;  Reviewed with patient  History reviewed. No pertinent past medical history.  History reviewed. No pertinent past surgical history.    Past Family Hx:  Reviewed with patient  History reviewed. No pertinent family history.    Social Hx:  Reviewed with patient  History   Substance Use Topics   ??? Smoking status: Current Every Day Smoker -- 0.25 packs/day     Types: Cigarettes   ??? Smokeless tobacco: Never Used   ??? Alcohol Use: No       OBJECTIVE  BP 124/70 mmHg   Pulse 64   Ht 5\' 6"  (1.676 m)   Wt 113 lb (51.256 kg)   BMI  18.25 kg/m2   SpO2 98%   Breastfeeding? No    Problem List:  [name] has Depression; Osteoarthritis; and Herpes genitalis in women on her problem list.    PHYS EX:  Physical Exam   Constitutional: She is oriented to person, place, and time. She appears well-developed and well-nourished. No distress.   HENT:   Head: Normocephalic and atraumatic.   Right Ear: External ear normal.   Left Ear: External ear normal.   Nose: Nose normal.   Mouth/Throat: Oropharynx is clear and moist. No oropharyngeal exudate.   Eyes: Conjunctivae and EOM are normal. Pupils are equal, round, and reactive to light. Right eye exhibits no discharge. Left eye exhibits no discharge. No scleral icterus.   Neck: Normal range of motion. Neck supple. No JVD present. No tracheal deviation present. No thyromegaly present.   Cardiovascular: Normal rate, regular rhythm and normal heart sounds.  Exam reveals no gallop and no friction rub.    No murmur heard.  Pulmonary/Chest: Effort normal and breath sounds normal. No respiratory distress. She has no wheezes. She has no rales. She exhibits no tenderness.   Abdominal: Soft. Bowel sounds are normal. She exhibits no distension and no mass. There is no tenderness. There is no rebound and no guarding. No hernia.   Musculoskeletal: Normal range of motion. She exhibits no edema or tenderness.  Lymphadenopathy:     She has no cervical adenopathy.   Neurological: She is alert and oriented to person, place, and time. She has normal reflexes. She displays normal reflexes. No cranial nerve deficit. She exhibits normal muscle tone. Coordination normal.   Skin: Skin is warm. No rash noted. She is not diaphoretic. No erythema. No pallor.   Psychiatric: She has a normal mood and affect. Her behavior is normal.   Mood is stable   Nursing note and vitals reviewed.      ASSESSMENT/PLAN  Levana was seen today for 6 month follow-up and health maintenance.    Diagnoses and associated orders for this visit:    Primary  osteoarthritis involving multiple joints  - ibuprofen (ADVIL;MOTRIN) 800 MG tablet; Take 1 tablet by mouth every 8 hours as needed    Herpes genitalis in women  - famciclovir (FAMVIR) 500 MG tablet; Take 1 tablet by mouth daily    Depression  - buPROPion (WELLBUTRIN XL) 300 MG XL tablet; TAKE 1 TABLET BY MOUTH EVERY MORNING          Outpatient Encounter Prescriptions as of 10/27/2014   Medication Sig Dispense Refill   ??? buPROPion (WELLBUTRIN XL) 300 MG XL tablet TAKE 1 TABLET BY MOUTH EVERY MORNING 90 tablet 1   ??? famciclovir (FAMVIR) 500 MG tablet Take 1 tablet by mouth daily 90 tablet 1   ??? ibuprofen (ADVIL;MOTRIN) 800 MG tablet Take 1 tablet by mouth every 8 hours as needed 270 tablet 1   ??? [DISCONTINUED] buPROPion (WELLBUTRIN XL) 300 MG XL tablet TAKE 1 TABLET BY MOUTH EVERY MORNING 30 tablet 3   ??? [DISCONTINUED] acetaminophen-codeine (TYLENOL #3) 300-30 MG per tablet 1 tablet     ??? [DISCONTINUED] amoxicillin (AMOXIL) 500 MG capsule 1 capsule     ??? [DISCONTINUED] HYDROcodone-acetaminophen (NORCO) 5-325 MG per tablet 1 tablet     ??? [DISCONTINUED] famciclovir (FAMVIR) 500 MG tablet Take 1 tablet by mouth daily 90 tablet 1   ??? [DISCONTINUED] ibuprofen (ADVIL;MOTRIN) 800 MG tablet Take 1 tablet by mouth every 8 hours as needed 270 tablet 1     No facility-administered encounter medications on file as of 10/27/2014.       No Follow-up on file.        Reviewed recent labs related to [facility] current problems      Discussed importance of regular Health Maintenance follow up  Health Maintenance   Topic   ??? CIGARETTE SMOKER    ??? HEPATITIS  C SCREENING    ??? HIV SCREENING    ??? COLON CANCER SCREENING FOBT    ??? FLU VACCINE YEARLY (ADULT)    ??? BREAST CANCER SCREENING    ??? CERVICAL CANCER SCREENING    ??? LIPIDS    ??? TETANUS VACCINE ADULT (11 YEARS AND UP)    ??? PNEUMOVAX 1 DOSE 19-64Y (2)

## 2014-12-26 ENCOUNTER — Telehealth: Payer: Self-pay | Admitting: *Deleted

## 2014-12-26 NOTE — Telephone Encounter (Signed)
Spoke with patient who states she will call tomorrow for appt at [facility], phone number given to patient.

## 2015-02-22 ENCOUNTER — Other Ambulatory Visit: Payer: Self-pay | Admitting: Family Medicine

## 2015-02-22 NOTE — Telephone Encounter (Signed)
Electronic refill request. Last Filled:   Cymbalta 30 mg 90 capsule 3 RF on  02/08/2014   Last Filled:   Cymbalta 60 mg  90 capsule 3 RF on 01/23/2014  Does patient need a CPE or at least an OV?

## 2015-02-23 NOTE — Telephone Encounter (Signed)
Sent.  Please schedule CPE.  Thanks.  

## 2015-02-23 NOTE — Telephone Encounter (Signed)
CPE scheduled  

## 2015-04-28 ENCOUNTER — Other Ambulatory Visit: Payer: Self-pay | Admitting: Family Medicine

## 2015-04-28 DIAGNOSIS — E781 Pure hyperglyceridemia: Secondary | ICD-10-CM

## 2015-04-30 ENCOUNTER — Other Ambulatory Visit (INDEPENDENT_AMBULATORY_CARE_PROVIDER_SITE_OTHER): Payer: BLUE CROSS/BLUE SHIELD

## 2015-04-30 DIAGNOSIS — E781 Pure hyperglyceridemia: Secondary | ICD-10-CM | POA: Diagnosis not present

## 2015-05-01 LAB — BASIC METABOLIC PANEL
BUN: 15 mg/dL (ref 7–25)
CHLORIDE: 101 mmol/L (ref 98–110)
CO2: 29 mmol/L (ref 20–31)
Calcium: 9.4 mg/dL (ref 8.6–10.4)
Creat: 0.78 mg/dL (ref 0.50–1.05)
Glucose, Bld: 83 mg/dL (ref 65–99)
POTASSIUM: 4.2 mmol/L (ref 3.5–5.3)
SODIUM: 139 mmol/L (ref 135–146)

## 2015-05-01 LAB — LIPID PANEL
Cholesterol: 209 mg/dL — ABNORMAL HIGH (ref 125–200)
HDL: 65 mg/dL (ref >=46)
LDL CALC: 125 mg/dL (ref <130)
Total CHOL/HDL Ratio: 3.2 Ratio (ref <=5.0)
Triglycerides: 96 mg/dL (ref <150)
VLDL: 19 mg/dL (ref <30)

## 2015-05-07 ENCOUNTER — Encounter: Payer: Self-pay | Admitting: Family Medicine

## 2015-05-07 ENCOUNTER — Other Ambulatory Visit: Payer: Self-pay | Admitting: Family Medicine

## 2015-05-07 ENCOUNTER — Ambulatory Visit (INDEPENDENT_AMBULATORY_CARE_PROVIDER_SITE_OTHER): Payer: BLUE CROSS/BLUE SHIELD | Admitting: Family Medicine

## 2015-05-07 VITALS — BP 114/76 | HR 73 | Temp 98.3°F | Ht 61.5 in | Wt 146.2 lb

## 2015-05-07 DIAGNOSIS — Z119 Encounter for screening for infectious and parasitic diseases, unspecified: Secondary | ICD-10-CM

## 2015-05-07 DIAGNOSIS — F32A Depression, unspecified: Secondary | ICD-10-CM

## 2015-05-07 DIAGNOSIS — Z0000 Encounter for general adult medical examination without abnormal findings: Secondary | ICD-10-CM | POA: Diagnosis not present

## 2015-05-07 DIAGNOSIS — F329 Major depressive disorder, single episode, unspecified: Secondary | ICD-10-CM

## 2015-05-07 DIAGNOSIS — Z1211 Encounter for screening for malignant neoplasm of colon: Secondary | ICD-10-CM

## 2015-05-07 DIAGNOSIS — Z7189 Other specified counseling: Secondary | ICD-10-CM

## 2015-05-07 MED ORDER — DULOXETINE HCL 60 MG PO CPEP: 60.0000 mg | ORAL_CAPSULE | Freq: Every day | ORAL | Status: DC

## 2015-05-07 MED ORDER — GABAPENTIN 100 MG PO CAPS: 100.0000 mg | ORAL_CAPSULE | Freq: Three times a day (TID) | ORAL | Status: DC | PRN

## 2015-05-07 MED ORDER — DULOXETINE HCL 30 MG PO CPEP: ORAL_CAPSULE | ORAL | Status: DC

## 2015-05-07 NOTE — Patient Instructions (Addendum)
Go to the lab on the way out.  We'll contact you with your lab report. (stool cards and blood labs) Take care. Glad to see you.  I would get a flu shot each fall.

## 2015-05-07 NOTE — Progress Notes (Signed)
Pre visit review using our clinic review tool, if applicable. No additional management support is needed unless otherwise documented below in the visit note.  CPE- See plan.  Routine anticipatory guidance given to patient.  See health maintenance. Tetanus 2015 Flu shot encouraged.  PNA and shingles shot not due.  Mammogram to be done in 12/16 No masses or changes noted by patient.  Recently saw GYN and had pap done.  05/03/15.  DXA not due. D/w pt.  Diet and exercise d/w pt. Encouraged both. Diet is variable. D/w patient EN:IDPOEUM for colon cancer screening, including IFOB vs. colonoscopy. Risks and benefits of both were discussed and patient voiced understanding. Pt elects for: IFOB.  Living will d/w pt. Husband designated if patient were incapacitated.  Pt opts in for HCV screening with next set of labs.  D/w pt re: routine screening.    Depression. D/w pt.  In a week, she'll have "variable" amounts of bad days, ~2 per week, but manageable. No SI/HI. Contracts for safety. Still on cymbalta, prev did counseling w/o a lot of relief. She is improved overall from her prev lows, from before cymbalta use.  We talked about risk/benefit of med change.  At this point, likely reasonable to continue as is.    PMH and SH reviewed  Meds, vitals, and allergies reviewed.   ROS: See HPI.  Otherwise negative.    GEN: nad, alert and oriented HEENT: mucous membranes moist NECK: supple w/o LA CV: rrr. PULM: ctab, no inc wob ABD: soft, +bs EXT: no edema SKIN: no acute rash

## 2015-05-08 LAB — HEPATITIS C ANTIBODY: HCV Ab: NEGATIVE

## 2015-05-08 LAB — HIV ANTIBODY (ROUTINE TESTING W REFLEX): HIV: NONREACTIVE

## 2015-05-08 NOTE — Assessment & Plan Note (Signed)
Living will d/w pt.  Husband designated if patient were incapacitated.  

## 2015-05-08 NOTE — Assessment & Plan Note (Signed)
D/w pt. In a week, she'll have "variable" amounts of bad days, ~2 per week, but manageable. No SI/HI. Contracts for safety. Still on cymbalta, prev did counseling w/o a lot of relief. She is improved overall from her prev lows, from before cymbalta use. We talked about risk/benefit of med change. At this point, likely reasonable to continue as is.  She agrees.  She'll update me if worsening.

## 2015-05-08 NOTE — Assessment & Plan Note (Signed)
Routine anticipatory guidance given to patient. See health maintenance.  Tetanus 2015  Flu shot encouraged.  PNA and shingles shot not due.  Mammogram to be done in 12/16 No masses or changes noted by patient.  Recently saw GYN and had pap done. 05/03/15.  DXA not due. D/w pt.  Diet and exercise d/w pt. Encouraged both. Diet is variable.  D/w patient XQ:JJHERDE for colon cancer screening, including IFOB vs. colonoscopy. Risks and benefits of both were discussed and patient voiced understanding. Pt elects for: IFOB.  Living will d/w pt. Husband designated if patient were incapacitated.  Pt opts in for HCV screening with next set of labs. D/w pt re: routine screening.

## 2015-05-10 ENCOUNTER — Other Ambulatory Visit: Payer: BLUE CROSS/BLUE SHIELD

## 2015-05-11 ENCOUNTER — Encounter: Payer: BLUE CROSS/BLUE SHIELD | Admitting: Family Medicine

## 2015-05-13 ENCOUNTER — Encounter

## 2015-05-14 MED ORDER — BUPROPION HCL ER (XL) 300 MG PO TB24
300 MG | ORAL_TABLET | ORAL | 1 refills | Status: DC
Start: 2015-05-14 — End: 2015-11-07

## 2015-05-21 ENCOUNTER — Other Ambulatory Visit: Payer: Self-pay | Admitting: Family Medicine

## 2015-05-21 ENCOUNTER — Other Ambulatory Visit (INDEPENDENT_AMBULATORY_CARE_PROVIDER_SITE_OTHER): Payer: BLUE CROSS/BLUE SHIELD

## 2015-05-21 DIAGNOSIS — R195 Other fecal abnormalities: Secondary | ICD-10-CM

## 2015-05-21 DIAGNOSIS — Z1211 Encounter for screening for malignant neoplasm of colon: Secondary | ICD-10-CM | POA: Diagnosis not present

## 2015-05-21 LAB — FECAL OCCULT BLOOD, IMMUNOCHEMICAL: FECAL OCCULT BLD: POSITIVE — AB

## 2015-05-24 ENCOUNTER — Other Ambulatory Visit: Payer: Self-pay | Admitting: Family Medicine

## 2015-05-25 ENCOUNTER — Telehealth: Payer: Self-pay | Admitting: Family Medicine

## 2015-05-25 NOTE — Telephone Encounter (Signed)
Pt returned your call from 10/31

## 2015-05-25 NOTE — Telephone Encounter (Signed)
See result note.  

## 2015-05-29 ENCOUNTER — Encounter: Payer: Self-pay | Admitting: Gastroenterology

## 2015-06-07 LAB — HPV, HIGH RISK: HPV, High Risk: NEGATIVE

## 2015-07-12 ENCOUNTER — Other Ambulatory Visit (INDEPENDENT_AMBULATORY_CARE_PROVIDER_SITE_OTHER): Payer: BLUE CROSS/BLUE SHIELD

## 2015-07-12 ENCOUNTER — Ambulatory Visit (INDEPENDENT_AMBULATORY_CARE_PROVIDER_SITE_OTHER): Payer: BLUE CROSS/BLUE SHIELD | Admitting: Gastroenterology

## 2015-07-12 ENCOUNTER — Encounter: Payer: Self-pay | Admitting: Gastroenterology

## 2015-07-12 VITALS — BP 112/70 | HR 78 | Ht 61.5 in | Wt 148.0 lb

## 2015-07-12 DIAGNOSIS — E781 Pure hyperglyceridemia: Secondary | ICD-10-CM

## 2015-07-12 DIAGNOSIS — Z1211 Encounter for screening for malignant neoplasm of colon: Secondary | ICD-10-CM

## 2015-07-12 DIAGNOSIS — R195 Other fecal abnormalities: Secondary | ICD-10-CM | POA: Diagnosis not present

## 2015-07-12 DIAGNOSIS — K625 Hemorrhage of anus and rectum: Secondary | ICD-10-CM | POA: Diagnosis not present

## 2015-07-12 LAB — CBC WITH DIFFERENTIAL/PLATELET
BASOS PCT: 0.6 % (ref 0.0–3.0)
Basophils Absolute: 0 10*3/uL (ref 0.0–0.1)
EOS ABS: 0.2 10*3/uL (ref 0.0–0.7)
EOS PCT: 3.4 % (ref 0.0–5.0)
HCT: 41 % (ref 36.0–46.0)
Hemoglobin: 13.7 g/dL (ref 12.0–15.0)
LYMPHS ABS: 2.2 10*3/uL (ref 0.7–4.0)
Lymphocytes Relative: 32.3 % (ref 12.0–46.0)
MCHC: 33.4 g/dL (ref 30.0–36.0)
MCV: 81.3 fl (ref 78.0–100.0)
MONO ABS: 0.5 10*3/uL (ref 0.1–1.0)
Monocytes Relative: 7.4 % (ref 3.0–12.0)
NEUTROS PCT: 56.3 % (ref 43.0–77.0)
Neutro Abs: 3.9 10*3/uL (ref 1.4–7.7)
Platelets: 312 10*3/uL (ref 150.0–400.0)
RBC: 5.05 Mil/uL (ref 3.87–5.11)
RDW: 13.9 % (ref 11.5–15.5)
WBC: 6.8 10*3/uL (ref 4.0–10.5)

## 2015-07-12 MED ORDER — NA SULFATE-K SULFATE-MG SULF 17.5-3.13-1.6 GM/177ML PO SOLN: ORAL | Status: DC

## 2015-07-12 NOTE — Patient Instructions (Signed)
You have been scheduled for a colonoscopy. Please follow written instructions given to you at your visit today.  Please pick up your prep supplies at the pharmacy within the next 1-3 days. If you use inhalers (even only as needed), please bring them with you on the day of your procedure. Your physician has requested that you go to www.startemmi.com and enter the access code given to you at your visit today. This web site gives a general overview about your procedure. However, you should still follow specific instructions given to you by our office regarding your preparation for the procedure.  We have sent the following medications to your pharmacy for you to pick up at your convenience: [facility] has requested that you go to the basement for lab work before leaving today.

## 2015-07-12 NOTE — Progress Notes (Signed)
HPI :  54 y/o female seen in consultation for blood in the stools from Dr. Elsie Stain.   She reported to have positive test for occult blood in the stools recently. She reports has seen overt blood in her stools, although she is not certain for how long this has been ongoing on. She sees it periodically. No perianal discomfort. She denies any changes in her bowel habits. No diarrhea or contipation. She has a BM once every few days or so at baseline. No abdominal pains routinely. No weight loss. No FH of known colon cancer. No FH of Crohns or colitis. She has never had a prior colonoscopy. She has had a prior history of anemia, she thinks about 10 years ago, she reports she is not sure why she was anemic historically.   She takes prilosec PRN for heartburn, does not take it daily. She thinks it works okay for her on an as needed basis. No dysphagia or odynophagia.   She otherwise has a history of elevated liver enzymes due to methotrexate use for history of RA. She reports her LFTs have since normalized, and since that time has not been on anything for RA.    Past Medical History  Diagnosis Date  . Depression   . Edema     with occ HCTZ use  . 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 Rheumatoid arthritis(714.0)     with prev MTX intolerance; per Dr. [facility]     Past Surgical History  Procedure Laterality Date  . Tubal ligation    . Cesarean section     Family History  Problem Relation Age of Onset  . Hypertension Father   . Leukemia Sister   . Colon cancer Neg Hx   . Breast cancer Cousin    Social History  Substance Use Topics  . Smoking status: Never Smoker   . Smokeless tobacco: Never Used  . Alcohol Use: 0.0 oz/week    0 Standard drinks or equivalent per week     Comment: rare   Current Outpatient Prescriptions  Medication Sig Dispense Refill  . clobetasol cream (TEMOVATE) [id number] % Apply 1 application topically daily.    . DULoxetine (CYMBALTA)  30 MG capsule TAKE ONE CAPSULE BY MOUTH EVERY DAY TAKE WITH 60MG  CAPSULE FOR TOTAL OF 90 MGS 90 capsule 3  . DULoxetine (CYMBALTA) 60 MG capsule TAKE 1 CAPSULE BY MOUTH DAILY 90 capsule 3  . gabapentin (NEURONTIN) 100 MG capsule Take 1 capsule (100 mg total) by mouth 3 (three) times daily as needed.    Marland Kitchen omeprazole (PRILOSEC) 20 MG capsule Take 20 mg by mouth daily as needed.     No current facility-administered medications for this visit.   Allergies  Allergen Reactions  . Methotrexate Derivatives     Elevated liver test  . Tessalon [Benzonatate] Other (See Comments)    Lack of effect.      Review of Systems: All systems reviewed and negative except where noted in HPI.   Lab Results  Component Value Date   WBC 6.8 07/12/2015   HGB 13.7 07/12/2015   HCT 41.0 07/12/2015   MCV 81.3 07/12/2015   PLT 312.0 07/12/2015   No results found for: ALT, AST, GGT, ALKPHOS, BILITOT  Lab Results  Component Value Date   CREATININE 0.78 04/30/2015   BUN 15 04/30/2015   NA 139 04/30/2015   K 4.2 04/30/2015   CL 101 04/30/2015   CO2  29 04/30/2015     Physical Exam: BP 112/70 mmHg  Pulse 78  Ht 5' 1.5" (1.562 m)  Wt 148 lb (67.132 kg)  BMI 27.51 kg/m2  LMP 08/19/2010 Constitutional: Pleasant,well-developed, female in no acute distress. HEENT: Normocephalic and atraumatic. Conjunctivae are normal. No scleral icterus. Neck supple.  Cardiovascular: Normal rate, regular rhythm.  Pulmonary/chest: Effort normal and breath sounds normal. No wheezing, rales or rhonchi. Abdominal: Soft, nondistended, nontender. Bowel sounds active throughout. There are no masses palpable. No hepatomegaly. Deferred rectal exam to time of colonoscopy Extremities: no edema Lymphadenopathy: No cervical adenopathy noted. Neurological: Alert and oriented to person place and time. Skin: Skin is warm and dry. No rashes noted. Psychiatric: Normal mood and affect. Behavior is normal.   ASSESSMENT AND PLAN: 54  y/o female with no prior CRC screening presenting with overt intermittent rectal bleeding with a positive FOBT. I discussed differential for what her rectal bleeding could represent and ultimately recommend a colonoscopy to further evaluate this and ensure no bleeding polyp or mass lesion. The indications, risks, and benefits of colonoscopy were explained to the patient in detail. Risks include but are not limited to bleeding, perforation, adverse reaction to medications, and cardiopulmonary compromise. Sequelae include but are not limited to the possibility of surgery, hospitalization, and mortality. The patient verbalized understanding and wished to proceed. All questions answered, referred to the scheduler and bowel prep ordered. She otherwise had a CBC obtained today which showed no evidence of anemia in light of this findings and her prior remote history of anemia. We will await colonoscopy results.    Cellar, MD [facility] Pager [phone]  CC: Tonia Ghent, MD

## 2015-07-26 ENCOUNTER — Encounter: Payer: Self-pay | Admitting: Gastroenterology

## 2015-07-26 ENCOUNTER — Ambulatory Visit (AMBULATORY_SURGERY_CENTER): Payer: BLUE CROSS/BLUE SHIELD | Admitting: Gastroenterology

## 2015-07-26 VITALS — BP 133/86 | HR 63 | Temp 99.0°F | Resp 19 | Ht 61.0 in | Wt 148.0 lb

## 2015-07-26 DIAGNOSIS — K625 Hemorrhage of anus and rectum: Secondary | ICD-10-CM

## 2015-07-26 DIAGNOSIS — D120 Benign neoplasm of cecum: Secondary | ICD-10-CM | POA: Diagnosis not present

## 2015-07-26 DIAGNOSIS — K6389 Other specified diseases of intestine: Secondary | ICD-10-CM | POA: Diagnosis not present

## 2015-07-26 DIAGNOSIS — D123 Benign neoplasm of transverse colon: Secondary | ICD-10-CM

## 2015-07-26 DIAGNOSIS — D122 Benign neoplasm of ascending colon: Secondary | ICD-10-CM | POA: Diagnosis not present

## 2015-07-26 DIAGNOSIS — D127 Benign neoplasm of rectosigmoid junction: Secondary | ICD-10-CM

## 2015-07-26 DIAGNOSIS — D128 Benign neoplasm of rectum: Secondary | ICD-10-CM | POA: Diagnosis not present

## 2015-07-26 MED ORDER — SODIUM CHLORIDE 0.9 % IV SOLN: 500.0000 mL | INTRAVENOUS | Status: DC

## 2015-07-26 NOTE — Progress Notes (Addendum)
Called to room to assist during endoscopic procedure.  Patient ID and intended procedure confirmed with present staff. Received instructions for my participation in the procedure from the performing physician.  Path sent [facility] per D.O.

## 2015-07-26 NOTE — Op Note (Signed)
[facility]. [place], [id number]   COLONOSCOPY PROCEDURE REPORT  PATIENT: [name], [name]  MR#: [id number] BIRTHDATE: May 27, 1961 , 61  yrs. old GENDER: female ENDOSCOPIST: Yetta Flock, MD REFERRED BY: Dr. Elsie Stain PROCEDURE DATE:  07/26/2015 PROCEDURE:   Colonoscopy, diagnostic, Colonoscopy, screening, Colonoscopy with snare polypectomy, Colonoscopy with biopsy, and Submucosal injection, any substance First Screening Colonoscopy - Avg.  risk and is 50 yrs.  old or older Yes.  Prior Negative Screening - Now for repeat screening. N/A  History of Adenoma - Now for follow-up colonoscopy & has been > or = to 3 yrs.  N/A  Polyps removed today? Yes ASA CLASS:   Class II INDICATIONS:Evaluation of unexplained GI bleeding, Screening for colonic neoplasia, and Colorectal Neoplasm Risk Assessment for this procedure is average risk. MEDICATIONS: Propofol 300 mg IV  DESCRIPTION OF PROCEDURE:   After the risks benefits and alternatives of the procedure were thoroughly explained, informed consent was obtained.  The digital rectal exam revealed no abnormalities of the rectum.   The LB [id number]  endoscope was introduced through the anus and advanced to the cecum, which was identified by both the appendix and ileocecal valve. No adverse events experienced.   The quality of the prep was adequate  The instrument was then slowly withdrawn as the colon was fully examined. Estimated blood loss is zero unless otherwise noted in this procedure report.    COLON FINDINGS: A diminutive sessile polyp was noted at the IC valve and removed with cold forceps.  A 36mm sessile polyp was noted in the ascending colon and removed with cold forceps.  A 67mm sessile ascending colon polyp was noted and removed with cold snare.  A 43mm sessile polyp was noted in the splenic flexure and removed with cold snare.  A mass was noted in the distal sigmoid colon,  roughly 20-25cm from the anal verge.  The mass was a few cm in size, had a central depression with mild ulceration, and was friable.  Multiple biopsies were obtained.  Tattoo was placed both proximal and distal to the mass.  A 58mm sessile polyp was noted in the rectum and removed via cold snare.  Retroflexed views revealed no abnormalities. The time to cecum = 4.5 Withdrawal time = 23.7   The scope was withdrawn and the procedure completed. COMPLICATIONS: There were no immediate complications.  ENDOSCOPIC IMPRESSION: Multiple colon polyps removed as above Sigmoid mass as described above, concerning for malignancy. Multiple biopsies obtained and tattoo placed  RECOMMENDATIONS: Await pathology results Resume diet Resume medications We will await pathology results and you will be contaced as soon as it is available, however a surgical consult will be warranted to resect this lesion, and if malignant, staging CT scans and CEA    eSigned:  Yetta Flock, MD 07/26/2015 8:09 AM   cc: Dr. Elsie Stain   PATIENT NAME:  Jacqueline Garza, Jacqueline Garza MR#: [id number]

## 2015-07-26 NOTE — Patient Instructions (Addendum)
YOU HAD AN ENDOSCOPIC PROCEDURE TODAY AT [facility]:   Refer to the procedure report that was given to you for any specific questions about what was found during the examination.  If the procedure report does not answer your questions, please call your gastroenterologist to clarify.  If you requested that your care partner not be given the details of your procedure findings, then the procedure report has been included in a sealed envelope for you to review at your convenience later.  YOU SHOULD EXPECT: Some feelings of bloating in the abdomen. Passage of more gas than usual.  Walking can help get rid of the air that was put into your GI tract during the procedure and reduce the bloating. If you had a lower endoscopy (such as a colonoscopy or flexible sigmoidoscopy) you may notice spotting of blood in your stool or on the toilet paper. If you underwent a bowel prep for your procedure, you may not have a normal bowel movement for a few days.  Please Note:  You might notice some irritation and congestion in your nose or some drainage.  This is from the oxygen used during your procedure.  There is no need for concern and it should clear up in a day or so.  SYMPTOMS TO REPORT IMMEDIATELY:   Following lower endoscopy (colonoscopy or flexible sigmoidoscopy):  Excessive amounts of blood in the stool  Significant tenderness or worsening of abdominal pains  Swelling of the abdomen that is new, acute  Fever of 100F or higher   For urgent or emergent issues, a gastroenterologist can be reached at any hour by calling [phone].   DIET: Your first meal following the procedure should be a small meal and then it is ok to progress to your normal diet. Heavy or fried foods are harder to digest and may make you feel nauseous or bloated.  Likewise, meals heavy in dairy and vegetables can increase bloating.  Drink plenty of fluids but you should avoid alcoholic beverages for 24  hours.  ACTIVITY:  You should plan to take it easy for the rest of today and you should NOT DRIVE or use heavy machinery until tomorrow (because of the sedation medicines used during the test).    FOLLOW UP: Our staff will call the number listed on your records the next business day following your procedure to check on you and address any questions or concerns that you may have regarding the information given to you following your procedure. If we do not reach you, we will leave a message.  However, if you are feeling well and you are not experiencing any problems, there is no need to return our call.  We will assume that you have returned to your regular daily activities without incident.  If any biopsies were taken you will be contacted by phone or by letter within the next 1-3 weeks.  Please call us at [phone] if you have not heard about the biopsies in 3 weeks.    SIGNATURES/CONFIDENTIALITY: You and/or your care partner have signed paperwork which will be entered into your electronic medical record.  These signatures attest to the fact that that the information above on your After Visit Summary has been reviewed and is understood.  Full responsibility of the confidentiality of this discharge information lies with you and/or your care-partner.  Please read all of the handouts and your physician report.  Please, call us if you need [place]. We will call you with the  path results.  Avoid NSAIDS and Aspirin for two weeks to prevent bleeding.

## 2015-07-26 NOTE — Progress Notes (Signed)
Report to PACU, RN, vss, BBS= Clear.  

## 2015-07-27 ENCOUNTER — Other Ambulatory Visit (INDEPENDENT_AMBULATORY_CARE_PROVIDER_SITE_OTHER): Payer: BLUE CROSS/BLUE SHIELD

## 2015-07-27 ENCOUNTER — Encounter: Payer: Self-pay | Admitting: *Deleted

## 2015-07-27 ENCOUNTER — Telehealth: Payer: Self-pay | Admitting: *Deleted

## 2015-07-27 ENCOUNTER — Other Ambulatory Visit: Payer: Self-pay | Admitting: *Deleted

## 2015-07-27 DIAGNOSIS — K6389 Other specified diseases of intestine: Secondary | ICD-10-CM

## 2015-07-27 DIAGNOSIS — C189 Malignant neoplasm of colon, unspecified: Secondary | ICD-10-CM

## 2015-07-27 LAB — BUN: BUN: 14 mg/dL (ref 6–23)

## 2015-07-27 LAB — CREATININE, SERUM: CREATININE: 0.87 mg/dL (ref 0.40–1.20)

## 2015-07-27 NOTE — Telephone Encounter (Signed)
Left a message for patient to call back.(needs surgical referral -sigmoid mass resection)

## 2015-07-27 NOTE — Telephone Encounter (Signed)
See results note. OV with [facility] scheduled and patient aware.

## 2015-07-27 NOTE — Telephone Encounter (Signed)
Spoke with patient and she does not have a preference for Psychologist, sport and exercise. Spoke with Sabrina at [facility] and gave her patient's information. She states she will call the patient with an appointment.

## 2015-07-27 NOTE — Telephone Encounter (Signed)
No answer, message left for the patient. 

## 2015-07-28 LAB — CEA: CEA: 0.6 ng/mL (ref 0.0–5.0)

## 2015-08-01 ENCOUNTER — Ambulatory Visit (INDEPENDENT_AMBULATORY_CARE_PROVIDER_SITE_OTHER)
Admission: RE | Admit: 2015-08-01 | Discharge: 2015-08-01 | Disposition: A | Discharge status: Home / Self Care | Payer: BLUE CROSS/BLUE SHIELD | Source: Ambulatory Visit | Attending: Gastroenterology | Admitting: Gastroenterology

## 2015-08-01 DIAGNOSIS — C189 Malignant neoplasm of colon, unspecified: Secondary | ICD-10-CM

## 2015-08-01 DIAGNOSIS — K6389 Other specified diseases of intestine: Secondary | ICD-10-CM

## 2015-08-01 MED ORDER — IOHEXOL 300 MG/ML  SOLN
100.0000 mL | Freq: Once | INTRAMUSCULAR | Status: AC | PRN
  Administered 2015-08-01: 100 mL via INTRAVENOUS

## 2015-08-02 ENCOUNTER — Telehealth: Payer: Self-pay | Admitting: *Deleted

## 2015-08-02 NOTE — Telephone Encounter (Signed)
Oncology Nurse Navigator Documentation  Oncology Nurse Navigator Flowsheets 08/02/2015  Navigator Location [facility]  Navigator Encounter Type Introductory phone call  Abnormal Finding Date 07/26/2015  Confirmed Diagnosis Date 07/26/2015  Surgery Date 08/14/2015  Called patient to inform her that we have her referral, but since CT was negative, surgery will be her initial treatment. Based on her final path, we'll get her in to be seen by medical oncologist. She understands and agrees with this plan.

## 2015-08-08 ENCOUNTER — Ambulatory Visit: Payer: Self-pay | Admitting: Surgery

## 2015-08-09 ENCOUNTER — Encounter (HOSPITAL_COMMUNITY): Payer: Self-pay

## 2015-08-09 ENCOUNTER — Encounter (HOSPITAL_COMMUNITY)
Admission: RE | Admit: 2015-08-09 | Discharge: 2015-08-09 | Disposition: A | Discharge status: Home / Self Care | Payer: BLUE CROSS/BLUE SHIELD | Source: Ambulatory Visit | Attending: Surgery | Admitting: Surgery

## 2015-08-09 DIAGNOSIS — Z01812 Encounter for preprocedural laboratory examination: Secondary | ICD-10-CM | POA: Insufficient documentation

## 2015-08-09 DIAGNOSIS — Z01810 Encounter for preprocedural cardiovascular examination: Secondary | ICD-10-CM | POA: Diagnosis not present

## 2015-08-09 HISTORY — DX: Personal history of other diseases of the digestive system: Z87.19

## 2015-08-09 HISTORY — DX: Gastro-esophageal reflux disease without esophagitis: K21.9

## 2015-08-09 HISTORY — DX: Malignant (primary) neoplasm, unspecified: C80.1

## 2015-08-09 HISTORY — DX: Presence of spectacles and contact lenses: Z97.3

## 2015-08-09 LAB — BASIC METABOLIC PANEL
Anion gap: 10 (ref 5–15)
BUN: 13 mg/dL (ref 6–20)
CHLORIDE: 105 mmol/L (ref 101–111)
CO2: 27 mmol/L (ref 22–32)
Calcium: 9.7 mg/dL (ref 8.9–10.3)
Creatinine, Ser: 0.79 mg/dL (ref 0.44–1.00)
GFR calc Af Amer: >60 mL/min (ref >60)
GFR calc non Af Amer: >60 mL/min (ref >60)
Glucose, Bld: 89 mg/dL (ref 65–99)
POTASSIUM: 4.4 mmol/L (ref 3.5–5.1)
SODIUM: 142 mmol/L (ref 135–145)

## 2015-08-09 LAB — CBC
HEMATOCRIT: 41.4 % (ref 36.0–46.0)
Hemoglobin: 13.2 g/dL (ref 12.0–15.0)
MCH: 27.1 pg (ref 26.0–34.0)
MCHC: 31.9 g/dL (ref 30.0–36.0)
MCV: 85 fL (ref 78.0–100.0)
Platelets: 314 10*3/uL (ref 150–400)
RBC: 4.87 MIL/uL (ref 3.87–5.11)
RDW: 13.3 % (ref 11.5–15.5)
WBC: 5.8 10*3/uL (ref 4.0–10.5)

## 2015-08-09 LAB — ABO/RH: ABO/RH(D): O POS

## 2015-08-09 NOTE — Progress Notes (Signed)
CT chest/epic 08/01/2015

## 2015-08-09 NOTE — Patient Instructions (Signed)
AYE WILKER  08/09/2015   Your procedure is scheduled on: Tuesday August 14, 2015   Report to [facility] Main  Entrance take [place]  elevators to 3rd floor to  [place] at 5:15 AM.  Call this number if you have problems the morning of surgery [phone]   Remember: ONLY 1 PERSON MAY GO WITH YOU TO SHORT STAY TO GET  READY MORNING OF [place].  Do not eat food or drink liquids :After Midnight.     Take these medicines the morning of surgery with A SIP OF WATER: Duloxetine (Cymbalta)                               You may not have any metal on your body including hair pins and              piercings  Do not wear jewelry, make-up, lotions, powders or perfumes, deodorant             Do not wear nail polish.  Do not shave  48 hours prior to surgery.               Do not bring valuables to the hospital. [place].  Contacts, dentures or bridgework may not be worn into surgery.  Leave suitcase in the car. After surgery it may be brought to your room.    Special Instructions: FOLLOW SURGEON'S INSTRUCTION IN REGARDS TO BOWEL PREPARATION PRIOR TO SURGICAL PROCEDURE DATE           _____________________________________________________________________             [facility] - Preparing for Surgery Before surgery, you can play an important role.  Because skin is not sterile, your skin needs to be as free of germs as possible.  You can reduce the number of germs on your skin by washing with CHG (chlorahexidine gluconate) soap before surgery.  CHG is an antiseptic cleaner which kills germs and bonds with the skin to continue killing germs even after washing. Please DO NOT use if you have an allergy to CHG or antibacterial soaps.  If your skin becomes reddened/irritated stop using the CHG and inform your nurse when you arrive at Short Stay. Do not shave (including legs and underarms) for at least 48 hours prior  to the first CHG shower.  You may shave your face/neck. Please follow these instructions carefully:  1.  Shower with CHG Soap the night before surgery and the  morning of Surgery.  2.  If you choose to wash your hair, wash your hair first as usual with your  normal  shampoo.  3.  After you shampoo, rinse your hair and body thoroughly to remove the  shampoo.                           4.  Use CHG as you would any other liquid soap.  You can apply chg directly  to the skin and wash                       Gently with a scrungie or clean washcloth.  5.  Apply the CHG Soap to your  body ONLY FROM THE NECK DOWN.   Do not use on face/ open                           Wound or open sores. Avoid contact with eyes, ears mouth and genitals (private parts).                       Wash face,  Genitals (private parts) with your normal soap.             6.  Wash thoroughly, paying special attention to the area where your surgery  will be performed.  7.  Thoroughly rinse your body with warm water from the neck down.  8.  DO NOT shower/wash with your normal soap after using and rinsing off  the CHG Soap.                9.  Pat yourself dry with a clean towel.            10.  Wear clean pajamas.            11.  Place clean sheets on your bed the night of your first shower and do not  sleep with pets. Day of Surgery : Do not apply any lotions/deodorants the morning of surgery.  Please wear clean clothes to the hospital/surgery center.  FAILURE TO FOLLOW THESE INSTRUCTIONS MAY RESULT IN THE CANCELLATION OF YOUR SURGERY PATIENT SIGNATURE_________________________________  NURSE SIGNATURE__________________________________  ________________________________________________________________________

## 2015-08-10 LAB — HEMOGLOBIN A1C
Hgb A1c MFr Bld: 5.6 % (ref 4.8–5.6)
MEAN PLASMA GLUCOSE: 114 mg/dL

## 2015-08-13 ENCOUNTER — Ambulatory Visit: Payer: Self-pay | Admitting: Surgery

## 2015-08-13 NOTE — H&P (Signed)
Jacqueline Garza 07/31/2015 3:38 PM Location: [facility] Patient #: [id number] DOB: 1960/10/25 Undefined / Language: Cleophus Molt / Race: White Female   History of Present Illness Rodman Key B. Hassell Done MD; 07/31/2015 4:06 PM) Patient words: Evaluate possible colon invasive adenocarcinoma.  The patient is a 55 year old female who presents with colorectal cancer. 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 She is followed at [facility] clinic for RA and had a problem taking methotrexate. Her disease mainly affects her knees in the summer.   Her primary care id Dr. Elsie Stain and her gastroenterologist is Dr. Cobalt Cellar.   She presented with a history of bloody BMs and postitive FOB. Colonoscopy at [facility] showed an ulcerating mass at 20-25 cm from the anal verge. Two other benign polyps were removed from the right colon. Path on the sigmoid lesion showed suspicious for adenocarcinoma.   I discussed lap assisted sigmoid colectomy with the risks not limited to bleeding, anastomotic leaks and need for temporary colostomy. She and her husband are aware the risks  She has had two prior C sections.    Other Problems Ivor Costa, CMA; 07/31/2015 3:40 PM) Anxiety Disorder Arthritis Depression Gastroesophageal Reflux Disease Hypercholesterolemia Melanoma Migraine Headache Other disease, cancer, significant illness  Past Surgical History Ivor Costa, [place]; 07/31/2015 3:40 PM) Cesarean Section - Multiple Colon Polyp Removal - Colonoscopy Liver Surgery Oral Surgery  Diagnostic Studies History Ivor Costa, [place]; 07/31/2015 3:40 PM) Colonoscopy within last year Mammogram >3 years ago Pap Smear 1-5 years ago  Allergies Ivor Costa, CMA; 07/31/2015 3:41 PM) Lavella Lemons *COUGH/COLD/ALLERGY* Methotrexate (Arthritis) *ANALGESICS -  ANTI-INFLAMMATORY*  Medication History Ivor Costa, CMA; 07/31/2015 3:42 PM) Temovate (0.05% Solution, External) Active. Cymbalta (60MG  Capsule DR Part, Oral) Active. Gabapentin (100MG  Tablet, Oral) Active. PriLOSEC (20MG  Capsule DR, Oral) Active.  Social History Ivor Costa, [place]; 07/31/2015 3:40 PM) Alcohol use Occasional alcohol use. Caffeine use Coffee, Tea. No drug use  Family History Ivor Costa, [place]; 07/31/2015 3:40 PM) Arthritis Family Members In General, Mother. Bleeding disorder Family Members In General. Breast Cancer Family Members In General. Cancer Family Members In [place] Members In General. Depression Family Members In General, Mother. Heart Disease Family Members In General. Hypertension Family Members In General. Kidney Disease Family Members In General. Migraine Headache Family Members In General, Father, Son. Thyroid problems Family Members In General.  Pregnancy / Birth History Ivor Costa, [place]; 07/31/2015 3:40 PM) Age at menarche 49 years. Age of menopause 45-50 Contraceptive History Oral contraceptives. Gravida 2 Irregular periods Maternal age 55-25 Para 2    Review of Systems Ivor Costa CMA; 07/31/2015 3:40 PM) General Not Present- Appetite Loss, Chills, Fatigue, Fever, Night Sweats, Weight Gain and Weight Loss. Skin Present- Dryness. Not Present- Change in Wart/Mole, Hives, Jaundice, New Lesions, Non-Healing Wounds, Rash and Ulcer. HEENT Present- Wears glasses/contact lenses. Not Present- Earache, Hearing Loss, Hoarseness, Nose Bleed, Oral Ulcers, Ringing in the Ears, Seasonal Allergies, Sinus Pain, Sore Throat, Visual Disturbances and Yellow Eyes. Respiratory Present- Snoring. Not Present- Bloody sputum, Chronic Cough, Difficulty Breathing and Wheezing. Breast Not Present- Breast Mass, Breast Pain, Nipple Discharge and Skin Changes. Cardiovascular Not Present- Chest Pain, Difficulty  Breathing Lying Down, Leg Cramps, Palpitations, Rapid Heart Rate, Shortness of Breath and Swelling of Extremities. Gastrointestinal Present- Bloating, Bloody Stool, Change in Bowel Habits, Constipation, Excessive gas, Indigestion and Nausea. Not Present- Abdominal Pain, Chronic diarrhea, Difficulty Swallowing, Gets  full quickly at meals, Hemorrhoids, Rectal Pain and Vomiting. Musculoskeletal Not Present- Back Pain, Joint Pain, Joint Stiffness, Muscle Pain, Muscle Weakness and Swelling of Extremities. Neurological Not Present- Decreased Memory, Fainting, Headaches, Numbness, Seizures, Tingling, Tremor, Trouble walking and Weakness. Psychiatric Present- Change in Sleep Pattern and Depression. Not Present- Anxiety, Bipolar, Fearful and Frequent crying. Hematology Not Present- Easy Bruising, Excessive bleeding, Gland problems, HIV and Persistent Infections.  Vitals Ivor Costa CMA; 07/31/2015 3:40 PM) 07/31/2015 3:40 PM Weight: 146.8 lb Height: 61.5in Body Surface Area: 1.67 m Body Mass Index: 27.29 kg/m  Temp.: 48F(Temporal)  Pulse: 78 (Regular)  Resp.: 16 (Unlabored)  BP: 118/70 (Sitting, Left Arm, Standard)       Physical Exam (Raeden Schippers B. Hassell Done MD; 07/31/2015 4:08 PM) General Note: HEENT well developed WF NAD Neck-supple Chest clear Heart SR Abdomen nontender Ext FROM-hands with minimal effect of RA Neuro alert and oriented x 3     Assessment & Plan Rodman Key B. Hassell Done MD; 07/31/2015 4:10 PM) CANCER OF SIGMOID COLON (C18.7) Impression: CT of abdomen is pending tomorrow. Will schedule for lap assisted sigmoid colectomy. I gave them booklets and explained the procedure in detail. Current Plans  Lap assisted sigmoid colectomy   Matt B. Hassell Done, MD, FACS

## 2015-08-14 ENCOUNTER — Inpatient Hospital Stay (HOSPITAL_COMMUNITY): Payer: BLUE CROSS/BLUE SHIELD | Admitting: Certified Registered"

## 2015-08-14 ENCOUNTER — Encounter (HOSPITAL_COMMUNITY)
Admission: RE | Disposition: A | Discharge status: Home / Self Care | Payer: Self-pay | Source: Ambulatory Visit | Attending: Surgery

## 2015-08-14 ENCOUNTER — Inpatient Hospital Stay (HOSPITAL_COMMUNITY)
Admission: RE | Admit: 2015-08-14 | Discharge: 2015-08-19 | DRG: 331 | Disposition: A | Discharge status: Home / Self Care | Payer: BLUE CROSS/BLUE SHIELD | Source: Ambulatory Visit | Attending: Surgery | Admitting: Surgery

## 2015-08-14 ENCOUNTER — Encounter (HOSPITAL_COMMUNITY): Payer: Self-pay | Admitting: *Deleted

## 2015-08-14 DIAGNOSIS — Z803 Family history of malignant neoplasm of breast: Secondary | ICD-10-CM

## 2015-08-14 DIAGNOSIS — Z818 Family history of other mental and behavioral disorders: Secondary | ICD-10-CM | POA: Diagnosis not present

## 2015-08-14 DIAGNOSIS — M1990 Unspecified osteoarthritis, unspecified site: Secondary | ICD-10-CM | POA: Diagnosis present

## 2015-08-14 DIAGNOSIS — Z85820 Personal history of malignant melanoma of skin: Secondary | ICD-10-CM

## 2015-08-14 DIAGNOSIS — Z01810 Encounter for preprocedural cardiovascular examination: Secondary | ICD-10-CM

## 2015-08-14 DIAGNOSIS — F329 Major depressive disorder, single episode, unspecified: Secondary | ICD-10-CM | POA: Diagnosis present

## 2015-08-14 DIAGNOSIS — Z888 Allergy status to other drugs, medicaments and biological substances status: Secondary | ICD-10-CM

## 2015-08-14 DIAGNOSIS — Z8249 Family history of ischemic heart disease and other diseases of the circulatory system: Secondary | ICD-10-CM

## 2015-08-14 DIAGNOSIS — Z01812 Encounter for preprocedural laboratory examination: Secondary | ICD-10-CM | POA: Diagnosis not present

## 2015-08-14 DIAGNOSIS — K660 Peritoneal adhesions (postprocedural) (postinfection): Secondary | ICD-10-CM | POA: Diagnosis present

## 2015-08-14 DIAGNOSIS — G43909 Migraine, unspecified, not intractable, without status migrainosus: Secondary | ICD-10-CM | POA: Diagnosis present

## 2015-08-14 DIAGNOSIS — Z79899 Other long term (current) drug therapy: Secondary | ICD-10-CM | POA: Diagnosis not present

## 2015-08-14 DIAGNOSIS — K219 Gastro-esophageal reflux disease without esophagitis: Secondary | ICD-10-CM | POA: Diagnosis present

## 2015-08-14 DIAGNOSIS — C19 Malignant neoplasm of rectosigmoid junction: Principal | ICD-10-CM | POA: Diagnosis present

## 2015-08-14 DIAGNOSIS — E7800 Pure hypercholesterolemia, unspecified: Secondary | ICD-10-CM | POA: Diagnosis present

## 2015-08-14 DIAGNOSIS — N736 Female pelvic peritoneal adhesions (postinfective): Secondary | ICD-10-CM | POA: Diagnosis present

## 2015-08-14 DIAGNOSIS — Z9049 Acquired absence of other specified parts of digestive tract: Secondary | ICD-10-CM

## 2015-08-14 DIAGNOSIS — F419 Anxiety disorder, unspecified: Secondary | ICD-10-CM | POA: Diagnosis present

## 2015-08-14 DIAGNOSIS — Z800 Family history of malignant neoplasm of digestive organs: Secondary | ICD-10-CM | POA: Diagnosis not present

## 2015-08-14 HISTORY — PX: LAPAROSCOPIC SIGMOID COLECTOMY: SHX5928

## 2015-08-14 LAB — TYPE AND SCREEN
ABO/RH(D): O POS
Antibody Screen: NEGATIVE

## 2015-08-14 LAB — CBC
HCT: 35.8 % — ABNORMAL LOW (ref 36.0–46.0)
HEMOGLOBIN: 11.4 g/dL — AB (ref 12.0–15.0)
MCH: 27.1 pg (ref 26.0–34.0)
MCHC: 31.8 g/dL (ref 30.0–36.0)
MCV: 85.2 fL (ref 78.0–100.0)
PLATELETS: 295 10*3/uL (ref 150–400)
RBC: 4.2 MIL/uL (ref 3.87–5.11)
RDW: 13.7 % (ref 11.5–15.5)
WBC: 16.8 10*3/uL — ABNORMAL HIGH (ref 4.0–10.5)

## 2015-08-14 LAB — CREATININE, SERUM
CREATININE: 0.9 mg/dL (ref 0.44–1.00)
GFR calc Af Amer: >60 mL/min (ref >60)

## 2015-08-14 SURGERY — COLECTOMY, SIGMOID, LAPAROSCOPIC
Anesthesia: General

## 2015-08-14 MED ORDER — ROCURONIUM BROMIDE 100 MG/10ML IV SOLN
INTRAVENOUS | Status: DC | PRN
  Administered 2015-08-14: 50 mg via INTRAVENOUS
  Administered 2015-08-14: 20 mg via INTRAVENOUS
  Administered 2015-08-14 (×2): 10 mg via INTRAVENOUS
  Administered 2015-08-14: 20 mg via INTRAVENOUS

## 2015-08-14 MED ORDER — LIDOCAINE HCL (CARDIAC) 20 MG/ML IV SOLN
INTRAVENOUS | Status: AC
  Filled 2015-08-14: qty 5

## 2015-08-14 MED ORDER — MORPHINE SULFATE (PF) 2 MG/ML IV SOLN
1.0000 mg | INTRAVENOUS | Status: DC | PRN
  Administered 2015-08-14 – 2015-08-15 (×3): 1 mg via INTRAVENOUS
  Filled 2015-08-14 (×3): qty 1

## 2015-08-14 MED ORDER — CHLORHEXIDINE GLUCONATE 4 % EX LIQD: 60.0000 mL | Freq: Once | CUTANEOUS | Status: DC

## 2015-08-14 MED ORDER — DEXTROSE 5 % IV SOLN
2.0000 g | Freq: Two times a day (BID) | INTRAVENOUS | Status: AC
  Administered 2015-08-14: 2 g via INTRAVENOUS
  Filled 2015-08-14: qty 2

## 2015-08-14 MED ORDER — PROPOFOL 10 MG/ML IV BOLUS
INTRAVENOUS | Status: DC | PRN
  Administered 2015-08-14: 150 mg via INTRAVENOUS
  Administered 2015-08-14: 50 mg via INTRAVENOUS

## 2015-08-14 MED ORDER — HEPARIN SODIUM (PORCINE) 5000 UNIT/ML IJ SOLN
5000.0000 [IU] | Freq: Once | INTRAMUSCULAR | Status: AC
  Administered 2015-08-14: 5000 [IU] via SUBCUTANEOUS
  Filled 2015-08-14: qty 1

## 2015-08-14 MED ORDER — ONDANSETRON HCL 4 MG/2ML IJ SOLN
INTRAMUSCULAR | Status: AC
  Filled 2015-08-14: qty 2

## 2015-08-14 MED ORDER — ROCURONIUM BROMIDE 100 MG/10ML IV SOLN
INTRAVENOUS | Status: AC
  Filled 2015-08-14: qty 1

## 2015-08-14 MED ORDER — ALVIMOPAN 12 MG PO CAPS
12.0000 mg | ORAL_CAPSULE | Freq: Once | ORAL | Status: AC
  Administered 2015-08-14: 12 mg via ORAL
  Filled 2015-08-14: qty 1

## 2015-08-14 MED ORDER — LIDOCAINE HCL (CARDIAC) 20 MG/ML IV SOLN
INTRAVENOUS | Status: DC | PRN
  Administered 2015-08-14: 50 mg via INTRATRACHEAL

## 2015-08-14 MED ORDER — KCL IN DEXTROSE-NACL 20-5-0.45 MEQ/L-%-% IV SOLN
INTRAVENOUS | Status: DC
  Administered 2015-08-14 – 2015-08-19 (×13): via INTRAVENOUS
  Filled 2015-08-14 (×17): qty 1000

## 2015-08-14 MED ORDER — ONDANSETRON HCL 4 MG PO TABS
4.0000 mg | ORAL_TABLET | Freq: Four times a day (QID) | ORAL | Status: DC | PRN
  Administered 2015-08-14: 4 mg via ORAL
  Filled 2015-08-14: qty 1

## 2015-08-14 MED ORDER — CEFOTETAN DISODIUM-DEXTROSE 2-2.08 GM-% IV SOLR
INTRAVENOUS | Status: AC
  Filled 2015-08-14: qty 50

## 2015-08-14 MED ORDER — SODIUM CHLORIDE 0.9 % IJ SOLN
INTRAMUSCULAR | Status: DC | PRN
  Administered 2015-08-14: 10 mL

## 2015-08-14 MED ORDER — PROPOFOL 10 MG/ML IV BOLUS
INTRAVENOUS | Status: AC
  Filled 2015-08-14: qty 40

## 2015-08-14 MED ORDER — LABETALOL HCL 5 MG/ML IV SOLN
INTRAVENOUS | Status: AC
  Filled 2015-08-14: qty 4

## 2015-08-14 MED ORDER — GLYCOPYRROLATE 0.2 MG/ML IJ SOLN
INTRAMUSCULAR | Status: AC
  Filled 2015-08-14: qty 3

## 2015-08-14 MED ORDER — HEPARIN SODIUM (PORCINE) 5000 UNIT/ML IJ SOLN
5000.0000 [IU] | Freq: Three times a day (TID) | INTRAMUSCULAR | Status: DC
  Administered 2015-08-14 – 2015-08-19 (×14): 5000 [IU] via SUBCUTANEOUS
  Filled 2015-08-14 (×17): qty 1

## 2015-08-14 MED ORDER — GLYCOPYRROLATE 0.2 MG/ML IJ SOLN
INTRAMUSCULAR | Status: DC | PRN
  Administered 2015-08-14: 0.6 mg via INTRAVENOUS

## 2015-08-14 MED ORDER — ONDANSETRON HCL 4 MG/2ML IJ SOLN: 4.0000 mg | Freq: Four times a day (QID) | INTRAMUSCULAR | Status: DC | PRN

## 2015-08-14 MED ORDER — PROMETHAZINE HCL 25 MG/ML IJ SOLN: 6.2500 mg | INTRAMUSCULAR | Status: DC | PRN

## 2015-08-14 MED ORDER — 0.9 % SODIUM CHLORIDE (POUR BTL) OPTIME
TOPICAL | Status: DC | PRN
  Administered 2015-08-14: 2000 mL

## 2015-08-14 MED ORDER — SACCHAROMYCES BOULARDII 250 MG PO CAPS
250.0000 mg | ORAL_CAPSULE | Freq: Two times a day (BID) | ORAL | Status: DC
  Administered 2015-08-15 – 2015-08-19 (×9): 250 mg via ORAL
  Filled 2015-08-14 (×11): qty 1

## 2015-08-14 MED ORDER — KCL IN DEXTROSE-NACL 20-5-0.45 MEQ/L-%-% IV SOLN
INTRAVENOUS | Status: AC
  Administered 2015-08-14: 1000 mL
  Filled 2015-08-14: qty 1000

## 2015-08-14 MED ORDER — FENTANYL CITRATE (PF) 250 MCG/5ML IJ SOLN
INTRAMUSCULAR | Status: AC
  Filled 2015-08-14: qty 5

## 2015-08-14 MED ORDER — DEXTROSE 5 % IV SOLN
2.0000 g | INTRAVENOUS | Status: AC
  Administered 2015-08-14: 2 g via INTRAVENOUS
  Filled 2015-08-14: qty 2

## 2015-08-14 MED ORDER — MIDAZOLAM HCL 2 MG/2ML IJ SOLN
INTRAMUSCULAR | Status: AC
  Filled 2015-08-14: qty 2

## 2015-08-14 MED ORDER — SODIUM CHLORIDE 0.9 % IJ SOLN
INTRAMUSCULAR | Status: AC
  Filled 2015-08-14: qty 20

## 2015-08-14 MED ORDER — HYDROCODONE-ACETAMINOPHEN 5-325 MG PO TABS
1.0000 | ORAL_TABLET | ORAL | Status: DC | PRN
  Administered 2015-08-14 – 2015-08-16 (×9): 2 via ORAL
  Filled 2015-08-14 (×9): qty 2

## 2015-08-14 MED ORDER — NEOSTIGMINE METHYLSULFATE 10 MG/10ML IV SOLN
INTRAVENOUS | Status: AC
  Filled 2015-08-14: qty 1

## 2015-08-14 MED ORDER — LABETALOL HCL 5 MG/ML IV SOLN
INTRAVENOUS | Status: DC | PRN
  Administered 2015-08-14: 5 mg via INTRAVENOUS

## 2015-08-14 MED ORDER — BUPIVACAINE LIPOSOME 1.3 % IJ SUSP
20.0000 mL | Freq: Once | INTRAMUSCULAR | Status: AC
  Administered 2015-08-14: 20 mL
  Filled 2015-08-14: qty 20

## 2015-08-14 MED ORDER — SODIUM CHLORIDE 0.9 % IJ SOLN
INTRAMUSCULAR | Status: AC
  Filled 2015-08-14: qty 10

## 2015-08-14 MED ORDER — LACTATED RINGERS IV SOLN
INTRAVENOUS | Status: DC | PRN
Start: 2015-08-14 — End: 2015-08-14
  Administered 2015-08-14 (×2): via INTRAVENOUS

## 2015-08-14 MED ORDER — FENTANYL CITRATE (PF) 250 MCG/5ML IJ SOLN
INTRAMUSCULAR | Status: DC | PRN
  Administered 2015-08-14: 50 ug via INTRAVENOUS
  Administered 2015-08-14: 100 ug via INTRAVENOUS
  Administered 2015-08-14 (×2): 50 ug via INTRAVENOUS

## 2015-08-14 MED ORDER — HYDROMORPHONE HCL 1 MG/ML IJ SOLN
0.2500 mg | INTRAMUSCULAR | Status: DC | PRN
  Administered 2015-08-14: 0.5 mg via INTRAVENOUS

## 2015-08-14 MED ORDER — HYDROMORPHONE HCL 1 MG/ML IJ SOLN
INTRAMUSCULAR | Status: DC | PRN
  Administered 2015-08-14 (×2): 0.5 mg via INTRAVENOUS
  Administered 2015-08-14: .2 mg via INTRAVENOUS
  Administered 2015-08-14 (×2): .4 mg via INTRAVENOUS

## 2015-08-14 MED ORDER — HYDROMORPHONE HCL 2 MG/ML IJ SOLN
INTRAMUSCULAR | Status: AC
  Filled 2015-08-14: qty 1

## 2015-08-14 MED ORDER — HYDROMORPHONE HCL 1 MG/ML IJ SOLN
INTRAMUSCULAR | Status: AC
  Filled 2015-08-14: qty 1

## 2015-08-14 MED ORDER — MIDAZOLAM HCL 5 MG/5ML IJ SOLN
INTRAMUSCULAR | Status: DC | PRN
  Administered 2015-08-14: 2 mg via INTRAVENOUS

## 2015-08-14 MED ORDER — DEXAMETHASONE SODIUM PHOSPHATE 10 MG/ML IJ SOLN
INTRAMUSCULAR | Status: AC
Start: 2015-08-14 — End: 2015-08-14
  Filled 2015-08-14: qty 1

## 2015-08-14 MED ORDER — LACTATED RINGERS IV SOLN
INTRAVENOUS | Status: DC
  Administered 2015-08-14: 13:00:00 via INTRAVENOUS

## 2015-08-14 MED ORDER — NEOSTIGMINE METHYLSULFATE 10 MG/10ML IV SOLN
INTRAVENOUS | Status: DC | PRN
  Administered 2015-08-14: 4 mg via INTRAVENOUS

## 2015-08-14 MED ORDER — ONDANSETRON HCL 4 MG/2ML IJ SOLN
INTRAMUSCULAR | Status: DC | PRN
  Administered 2015-08-14: 4 mg via INTRAVENOUS

## 2015-08-14 MED ORDER — ALVIMOPAN 12 MG PO CAPS
12.0000 mg | ORAL_CAPSULE | Freq: Two times a day (BID) | ORAL | Status: DC
  Administered 2015-08-15 – 2015-08-19 (×9): 12 mg via ORAL
  Filled 2015-08-14 (×10): qty 1

## 2015-08-14 MED ORDER — LACTATED RINGERS IR SOLN
Status: DC | PRN
  Administered 2015-08-14: 1

## 2015-08-14 MED ORDER — PEG 3350-KCL-NA BICARB-NACL 420 G PO SOLR: 4000.0000 mL | Freq: Once | ORAL | Status: DC

## 2015-08-14 SURGICAL SUPPLY — 38 items
BLADE EXTENDED COATED 6.5IN (ELECTRODE) ×2 IMPLANT
BLADE HEX COATED 2.75 (ELECTRODE) ×6 IMPLANT
CABLE HIGH FREQUENCY MONO STRZ (ELECTRODE) ×2 IMPLANT
CELLS DAT CNTRL 66122 CELL SVR (MISCELLANEOUS) ×1 IMPLANT
COVER SURGICAL LIGHT HANDLE (MISCELLANEOUS) ×3 IMPLANT
DRAIN CHANNEL 19F RND (DRAIN) IMPLANT
DRAPE LAPAROSCOPIC ABDOMINAL (DRAPES) ×3 IMPLANT
ELECT BLADE TIP CTD 4 INCH (ELECTRODE) ×2 IMPLANT
ELECT REM PT RETURN 9FT ADLT (ELECTROSURGICAL) ×3
ELECTRODE REM PT RTRN 9FT ADLT (ELECTROSURGICAL) ×1 IMPLANT
GLOVE BIOGEL M 8.0 STRL (GLOVE) ×6 IMPLANT
GOWN STRL REUS W/TWL XL LVL3 (GOWN DISPOSABLE) ×12 IMPLANT
HANDLE SUCTION POOLE (INSTRUMENTS) ×1 IMPLANT
KIT PREVENA INCISION MGT 13 (CANNISTER) ×2 IMPLANT
LEGGING LITHOTOMY PAIR STRL (DRAPES) IMPLANT
LIGASURE IMPACT 36 18CM CVD LR (INSTRUMENTS) ×2 IMPLANT
LIQUID BAND (GAUZE/BANDAGES/DRESSINGS) ×2 IMPLANT
PACK COLON (CUSTOM PROCEDURE TRAY) ×3 IMPLANT
PACK GENERAL/GYN (CUSTOM PROCEDURE TRAY) ×3 IMPLANT
RETRACTOR WND ALEXIS 18 MED (MISCELLANEOUS) IMPLANT
RTRCTR WOUND ALEXIS 18CM MED (MISCELLANEOUS) ×3
SCISSORS LAP 5X45 EPIX DISP (ENDOMECHANICALS) ×3 IMPLANT
SET IRRIG TUBING LAPAROSCOPIC (IRRIGATION / IRRIGATOR) ×7 IMPLANT
SHEARS HARMONIC ACE PLUS 45CM (MISCELLANEOUS) ×3 IMPLANT
SLEEVE XCEL OPT CAN 5 100 (ENDOMECHANICALS) ×6 IMPLANT
STAPLER CUT CVD 40MM BLUE (STAPLE) ×2 IMPLANT
STAPLER CUT RELOAD BLUE (STAPLE) ×2 IMPLANT
SUCTION POOLE HANDLE (INSTRUMENTS) ×3
SUT MNCRL AB 4-0 PS2 18 (SUTURE) ×2 IMPLANT
SUT NOVA 1 T20/GS 25DT (SUTURE) ×6 IMPLANT
SUT PDS AB 4-0 SH 27 (SUTURE) ×4 IMPLANT
SUT SILK 2 0 SH CR/8 (SUTURE) ×3 IMPLANT
SUT SILK 3 0 SH CR/8 (SUTURE) ×9 IMPLANT
TRAY FOLEY W/METER SILVER 14FR (SET/KITS/TRAYS/PACK) ×3 IMPLANT
TROCAR BLADELESS OPT 5 100 (ENDOMECHANICALS) ×3 IMPLANT
TROCAR XCEL BLUNT TIP 100MML (ENDOMECHANICALS) IMPLANT
TUBING FILTER THERMOFLATOR (ELECTROSURGICAL) ×3 IMPLANT
WATER STERILE IRR 1000ML POUR (IV SOLUTION) ×4 IMPLANT

## 2015-08-14 NOTE — Brief Op Note (Signed)
08/14/2015  11:44 AM  PATIENT:  Jacqueline Garza  55 y.o. female  PRE-OPERATIVE DIAGNOSIS:  Invasive adenocarcinoma  POST-OPERATIVE DIAGNOSIS:  Invasive adenocarcinoma  PROCEDURE:  Procedure(s): LAPAROSCOPIC ENTEROLYSIS AND MOBILIZATION OF SPLENIC FLEXURE; SIGMOID COLECTOMY WITH PRIMARY HAND SEWN ANASTOMOSIS;  ENDING LAPAROSCOPY AND INJECTION OF EXPAREL (N/A)  SURGEON:  Surgeon(s) and Role:    * Johnathan Hausen, MD - Primary    * Jackolyn Confer, MD - Assisting  PHYSICIAN ASSISTANT:   ASSISTANTS: Jackolyn Confer, MD, FACS   ANESTHESIA:   general  EBL:  Total I/O In: 1000 [I.V.:1000] Out: 352 [Urine:152; Blood:200]  BLOOD ADMINISTERED:none  DRAINS: none   LOCAL MEDICATIONS USED:  BUPIVICAINE   SPECIMEN:  Source of Specimen:  sigmoid colon  DISPOSITION OF SPECIMEN:  PATHOLOGY  COUNTS:  YES  TOURNIQUET:  * No tourniquets in log *  DICTATION: .Other Dictation: Dictation Number [id number]  PLAN OF CARE: Admit to inpatient   PATIENT DISPOSITION:  PACU - hemodynamically stable.   Delay start of Pharmacological VTE agent (>24hrs) due to surgical blood loss or risk of bleeding: yes

## 2015-08-14 NOTE — Op Note (Signed)
NAMEOVA, BOLHUIS                ACCOUNT NO.:  [id number]  MEDICAL RECORD NO.:  [id number]  LOCATION:  [facility]                         FACILITY:  [facility]  PHYSICIAN:  Isabel Caprice. Hassell Done, MD  DATE OF BIRTH:  1960/07/24  DATE OF PROCEDURE: DATE OF DISCHARGE:                              OPERATIVE REPORT   SURGEON:  Griselda Tosh B. Hassell Done, MD.  ASSISTANT:  Odis Hollingshead, M.D.  PROCEDURES:  Laparoscopic enterolysis x1 hour for a large uterus stuck up to the previous C-section incision, mobilization of the splenic flexure, sigmoid colectomy with primary hand-sewn 2-layer end-to-end anastomosis, repeat laparoscopy following closure with an injection of Exparel into incisions.  ANESTHESIA:  General endotracheal.  DESCRIPTION FOR PROCEDURE:  Eulla Farraj is a 55 year old white female, who underwent a colonoscopy by Dr. Havery Moros and was found to have an ulcerated mass at 25 cm.  He tattooed this proximally and distally. Following a time-out, the abdomen was entered through the right upper quadrant using 5-mm Optiview without difficulty.  The [employer]ry[employer] equipment was used videoscopically.  The most striking thing was the enormous presence of the uterus stuck up to the anterior abdominal wall, the site of her previous C-section.  A 5-mm was placed in the right lower quadrant and in the left upper quadrant, and using these, I began to take just anterior abdominal wall adhesions down and did so with sharp dissection.  To mobilize portion of the uterus anteriorly, I used a Harmonic scalpel.  I was then able to place another port in the midline and then mobilized the left descending colon.  There was no real good white line on the left side as there were numerous adhesions and everything seemed to be pretty stuck.  However, I was able to take this up to the splenic flexure.  I identified this plane and take down the flexure moving it medially.  I then went down distally and began dissecting the  sigmoid with the visible tattoos out of the pelvis and from the right tube and ovary.  I then got this mobilized where it would come up into the midline.  I made a smaller midline incision, which I ultimately extended down more toward the pubis and used the hand port wound protector.  I did more mobilization distally using the Harmonic scalpel and feeling and was able to get the sigmoid up into the wound. I did some further mobilization into the pelvis using the Harmonic scalpel again to do that.  I eventually got good mobilization and was able to get 10 cm proximal and distal to the palpable mass that we could feel in the sigmoid.  The mass itself did not appear to be full thickness, it was palpable.  There was no obvious adenopathy.  I divided the bowel distally with the Contour blue load and went through the mesentery with the LigaSure.  I tried to make a V and go deep straight down.  Likewise, it was proximal, I did the same, divided with the Contour stapler blue load and went down into the mesentery deep.  I stayed in the midline.  I did not palpate any nodes.  I put a marking suture on  the proximal margin.  Specimen was removed and sent for permanent sections to Pathology.  I next constructed an end-to-end anastomosis by placing the bowel ends, which were not under tension and sutured the back wall of 3-0 silk.  I then removed the staple lines.  I sewed it inner layer of running locking suture of 4-0 PDS, caring it anteriorly in a canal Mayo fashion. Second layer of 3-0 silks were placed anteriorly to complete the anastomosis.  We then followed the bowel colon protocol and I changed gown and gloves. The lower midline wound was closed with approximately 14 interrupted #1 Novafil sutures.  I then reinserted the scope through the right upper quadrant and sucked out the area.  There was no active bleeding and I irrigated and removed the irrigation.  I inspected the anastomosis  and appeared to be looking good and intact and the bowel looked good and viable.  The incisional closure looked good with full-thickness Novafil was placed.  No leak was noted when we created a pneumoperitoneum.  I also injected at this time with Exparel, diluted to 30 mL.  The trocar sites were closed with 4-0 Monocryl.  Midline incision was closed with staples and then, we placed a Prevena wound incisional vacuum device over the incision.  Sponge and needle counts were reported as correct. The patient tolerated the procedure well and was taken to the recovery room in satisfactory condition.     Isabel Caprice Hassell Done, MD     MBM/MEDQ  D:  08/14/2015  T:  08/14/2015  Job:  [id number]  cc:   Elveria Rising. Damita Dunnings, M.D. Fax: [phone]  Carlota Raspberry. Havery Moros, M.D.

## 2015-08-14 NOTE — Interval H&P Note (Signed)
History and Physical Interval Note:  08/14/2015 7:24 AM  Jacqueline Garza  has presented today for surgery, with the diagnosis of Invasive adenocarcinoma  The various methods of treatment have been discussed with the patient and family. After consideration of risks, benefits and other options for treatment, the patient has consented to  Procedure(s): LAPAROSCOPIC SIGMOID COLECTOMY (N/A) as a surgical intervention .  The patient's history has been reviewed, patient examined, no change in status, stable for surgery.  I have reviewed the patient's chart and labs.  Questions were answered to the patient's satisfaction.     Naina Sleeper B

## 2015-08-14 NOTE — Anesthesia Preprocedure Evaluation (Signed)
Anesthesia Evaluation  Patient identified by MRN, date of birth, ID band Patient awake    Reviewed: Allergy & Precautions, NPO status , Patient's Chart, lab work & pertinent test results  Airway Mallampati: II  TM Distance: >3 FB Neck ROM: Full    Dental no notable dental hx.    Pulmonary neg pulmonary ROS,    Pulmonary exam normal breath sounds clear to auscultation       Cardiovascular negative cardio ROS Normal cardiovascular exam Rhythm:Regular Rate:Normal     Neuro/Psych PSYCHIATRIC DISORDERS Anxiety Depression negative neurological ROS     GI/Hepatic Neg liver ROS, hiatal hernia, GERD  ,  Endo/Other  negative endocrine ROS  Renal/GU Renal disease  negative genitourinary   Musculoskeletal  (+) Arthritis , Rheumatoid disorders,    Abdominal   Peds negative pediatric ROS (+)  Hematology negative hematology ROS (+)   Anesthesia Other Findings   Reproductive/Obstetrics negative OB ROS                             Anesthesia Physical Anesthesia Plan  ASA: II  Anesthesia Plan: General   Post-op Pain Management:    Induction: Intravenous  Airway Management Planned: Oral ETT  Additional Equipment:   Intra-op Plan:   Post-operative Plan: Extubation in OR  Informed Consent: I have reviewed the patients History and Physical, chart, labs and discussed the procedure including the risks, benefits and alternatives for the proposed anesthesia with the patient or authorized representative who has indicated his/her understanding and acceptance.   Dental advisory given  Plan Discussed with: CRNA  Anesthesia Plan Comments:         Anesthesia Quick Evaluation

## 2015-08-14 NOTE — Anesthesia Procedure Notes (Signed)
Procedure Name: Intubation Date/Time: 08/14/2015 7:42 AM Performed by: Pilar Grammes Pre-anesthesia Checklist: Patient identified, Emergency Drugs available, Suction available, Patient being monitored and Timeout performed Patient Re-evaluated:Patient Re-evaluated prior to inductionOxygen Delivery Method: Circle system utilized Preoxygenation: Pre-oxygenation with 100% oxygen Intubation Type: IV induction Ventilation: Mask ventilation without difficulty Laryngoscope Size: Miller and 2 Grade View: Grade I Tube type: Oral Tube size: 7.0 mm Number of attempts: 1 Airway Equipment and Method: Stylet Placement Confirmation: positive ETCO2,  ETT inserted through vocal cords under direct vision,  CO2 detector and breath sounds checked- equal and bilateral Secured at: 22 cm Tube secured with: Tape Dental Injury: Teeth and Oropharynx as per pre-operative assessment

## 2015-08-14 NOTE — Anesthesia Postprocedure Evaluation (Signed)
Anesthesia Post Note  Patient: Jacqueline Garza  Procedure(s) Performed: Procedure(s) (LRB): LAPAROSCOPIC ENTEROLYSIS AND MOBILIZATION OF SPLENIC FLEXURE; SIGMOID COLECTOMY WITH PRIMARY HAND SEWN ANASTOMOSIS;  ENDING LAPAROSCOPY AND INJECTION OF EXPAREL (N/A)  Patient location during evaluation: PACU Anesthesia Type: General Level of consciousness: awake and alert Pain management: pain level controlled Vital Signs Assessment: post-procedure vital signs reviewed and stable Respiratory status: spontaneous breathing, nonlabored ventilation, respiratory function stable and patient connected to nasal cannula oxygen Cardiovascular status: blood pressure returned to baseline and stable Postop Assessment: no signs of nausea or vomiting Anesthetic complications: no    Last Vitals:  Filed Vitals:   08/14/15 1353 08/14/15 1400  BP:  112/60  Pulse: 90 86  Temp:  36.5 C  Resp: 14 9    Last Pain:  Filed Vitals:   08/14/15 1402  PainSc: 3                  Joslynn Jamroz J

## 2015-08-14 NOTE — Transfer of Care (Signed)
Immediate Anesthesia Transfer of Care Note  Patient: Jacqueline Garza  Procedure(s) Performed: Procedure(s): LAPAROSCOPIC ENTEROLYSIS AND MOBILIZATION OF SPLENIC FLEXURE; SIGMOID COLECTOMY WITH PRIMARY HAND SEWN ANASTOMOSIS;  ENDING LAPAROSCOPY AND INJECTION OF EXPAREL (N/A)  Patient Location: PACU  Anesthesia Type:General  Level of Consciousness: alert , oriented and patient cooperative  Airway & Oxygen Therapy: Patient Spontanous Breathing and Patient connected to face mask oxygen  Post-op Assessment: Report given to RN and Post -op Vital signs reviewed and stable  Post vital signs: stable  Last Vitals:  Filed Vitals:   08/14/15 0512  BP: 123/95  Pulse: 88  Temp: 36.6 C  Resp: 18    Complications: No apparent anesthesia complications

## 2015-08-15 LAB — BASIC METABOLIC PANEL
ANION GAP: 6 (ref 5–15)
BUN: 6 mg/dL (ref 6–20)
CALCIUM: 8.2 mg/dL — AB (ref 8.9–10.3)
CO2: 26 mmol/L (ref 22–32)
Chloride: 109 mmol/L (ref 101–111)
Creatinine, Ser: 0.79 mg/dL (ref 0.44–1.00)
Glucose, Bld: 140 mg/dL — ABNORMAL HIGH (ref 65–99)
POTASSIUM: 3.9 mmol/L (ref 3.5–5.1)
SODIUM: 141 mmol/L (ref 135–145)

## 2015-08-15 LAB — CBC
HEMATOCRIT: 31.6 % — AB (ref 36.0–46.0)
HEMOGLOBIN: 10 g/dL — AB (ref 12.0–15.0)
MCH: 27.2 pg (ref 26.0–34.0)
MCHC: 31.6 g/dL (ref 30.0–36.0)
MCV: 85.9 fL (ref 78.0–100.0)
Platelets: 256 10*3/uL (ref 150–400)
RBC: 3.68 MIL/uL — ABNORMAL LOW (ref 3.87–5.11)
RDW: 13.9 % (ref 11.5–15.5)
WBC: 10 10*3/uL (ref 4.0–10.5)

## 2015-08-15 MED ORDER — DULOXETINE HCL 60 MG PO CPEP
90.0000 mg | ORAL_CAPSULE | Freq: Every day | ORAL | Status: DC
  Administered 2015-08-15 – 2015-08-19 (×5): 90 mg via ORAL
  Filled 2015-08-15 (×5): qty 1

## 2015-08-15 NOTE — Progress Notes (Signed)
Patient ID: Jacqueline Garza, female   DOB: 1961/01/04, 55 y.o.   MRN: [id number] [facility] Surgery Progress Note:   1 Day Post-Op  Subjective: Mental status is clear.  No complaints Objective: Vital signs in last 24 hours: Temp:  [97.6 F (36.4 C)-98.1 F (36.7 C)] 97.9 F (36.6 C) (01/25 0514) Pulse Rate:  [68-101] 80 (01/25 0514) Resp:  [9-18] 16 (01/25 0514) BP: (97-139)/(47-68) 97/47 mmHg (01/25 0514) SpO2:  [97 %-100 %] 97 % (01/25 0514)  Intake/Output from previous day: 01/24 0701 - 01/25 0700 In: 4000 [I.V.:4000] Out: 1527 [Urine:1327; Blood:200] Intake/Output this shift:    Physical Exam: Work of breathing is normal.  Minimal pain  Lab Results:  Results for orders placed or performed during the hospital encounter of 08/14/15 (from the past 48 hour(s))  CBC     Status: Abnormal   Collection Time: 08/14/15  3:37 PM  Result Value Ref Range   WBC 16.8 (H) 4.0 - 10.5 K/uL   RBC 4.20 3.87 - 5.11 MIL/uL   Hemoglobin 11.4 (L) 12.0 - 15.0 g/dL   HCT 35.8 (L) 36.0 - 46.0 %   MCV 85.2 78.0 - 100.0 fL   MCH 27.1 26.0 - 34.0 pg   MCHC 31.8 30.0 - 36.0 g/dL   RDW 13.7 11.5 - 15.5 %   Platelets 295 150 - 400 K/uL  Creatinine, serum     Status: None   Collection Time: 08/14/15  3:37 PM  Result Value Ref Range   Creatinine, Ser 0.90 0.44 - 1.00 mg/dL   GFR calc non Af Amer >60 >60 mL/min   GFR calc Af Amer >60 >60 mL/min    Comment: (NOTE) The eGFR has been calculated using the CKD EPI equation. This calculation has not been validated in all clinical situations. eGFR's persistently <60 mL/min signify possible Chronic Kidney Disease.   Basic metabolic panel     Status: Abnormal   Collection Time: 08/15/15  4:07 AM  Result Value Ref Range   Sodium 141 135 - 145 mmol/L   Potassium 3.9 3.5 - 5.1 mmol/L   Chloride 109 101 - 111 mmol/L   CO2 26 22 - 32 mmol/L   Glucose, Bld 140 (H) 65 - 99 mg/dL   BUN 6 6 - 20 mg/dL   Creatinine, Ser 0.79 0.44 - 1.00 mg/dL   Calcium  8.2 (L) 8.9 - 10.3 mg/dL   GFR calc non Af Amer >60 >60 mL/min   GFR calc Af Amer >60 >60 mL/min    Comment: (NOTE) The eGFR has been calculated using the CKD EPI equation. This calculation has not been validated in all clinical situations. eGFR's persistently <60 mL/min signify possible Chronic Kidney Disease.    Anion gap 6 5 - 15  CBC     Status: Abnormal   Collection Time: 08/15/15  4:07 AM  Result Value Ref Range   WBC 10.0 4.0 - 10.5 K/uL   RBC 3.68 (L) 3.87 - 5.11 MIL/uL   Hemoglobin 10.0 (L) 12.0 - 15.0 g/dL   HCT 31.6 (L) 36.0 - 46.0 %   MCV 85.9 78.0 - 100.0 fL   MCH 27.2 26.0 - 34.0 pg   MCHC 31.6 30.0 - 36.0 g/dL   RDW 13.9 11.5 - 15.5 %   Platelets 256 150 - 400 K/uL    Radiology/Results: No results found.  Anti-infectives: Anti-infectives    Start     Dose/Rate Route Frequency Ordered Stop   08/14/15 1445  cefoTEtan (CEFOTAN)  2 g in dextrose 5 % 50 mL IVPB     2 g 100 mL/hr over 30 Minutes Intravenous Every 12 hours 08/14/15 1431 08/14/15 1523   08/14/15 0512  cefoTEtan (CEFOTAN) 2 g in dextrose 5 % 50 mL IVPB     2 g 100 mL/hr over 30 Minutes Intravenous On call to O.R. 08/14/15 0512 08/14/15 0755      Assessment/Plan: Problem List: Patient Active Problem List   Diagnosis Date Noted  . S/P colectomy 08/14/2015  . Pain, low back 09/18/2014  . Cough 09/18/2014  . Advance care planning 01/23/2014  . Routine general medical examination at a health care facility 01/23/2014  . Anxiety state, unspecified 08/30/2013  . 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 Inflammatory arthritis ([place]) 10/18/2010  . JOINT EFFUSION, KNEE 01/26/2009  . Depression 08/03/2008  . SLEEPLESSNESS 08/03/2008  . ADVEF, DRUG/MED/BIOL SUBST, OTHER DRUG NOS 05/18/2007    Discontinue Foley.   1 Day Post-Op    LOS: 1 day   Matt B. Hassell Done, MD, [facility] [phone] beeper [phone]  08/15/2015 9:33 AM

## 2015-08-15 NOTE — Care Management Note (Signed)
Case Management Note  Patient Details  Name: Jacqueline Garza MRN: [id number] Date of Birth: [birth year]  Subjective/Objective: 55 y/o f admitted w/Colorectal Ca. POD#1 colectomy. From home.                   Action/Plan:d/c plan home.   Expected Discharge Date:                  Expected Discharge Plan:  Home/Self Care  In-House Referral:     Discharge planning Services  CM Consult  Post Acute Care Choice:    Choice offered to:     DME Arranged:    DME Agency:     HH Arranged:    HH Agency:     Status of Service:  In process, will continue to follow  Medicare Important Message Given:    Date Medicare IM Given:    Medicare IM give by:    Date Additional Medicare IM Given:    Additional Medicare Important Message give by:     If discussed at [place] of Stay Meetings, dates discussed:    Additional Comments:  Dessa Phi, RN 08/15/2015, 10:26 AM

## 2015-08-17 NOTE — Progress Notes (Signed)
Patient ID: Jacqueline Garza, female   DOB: 1961-04-14, 55 y.o.   MRN: [id number] [facility] Surgery Progress Note:   3 Days Post-Op  Subjective: Mental status is clear.  No complaints Objective: Vital signs in last 24 hours: Temp:  [98.1 F (36.7 C)-98.8 F (37.1 C)] 98.1 F (36.7 C) (01/27 0536) Pulse Rate:  [85-91] 85 (01/27 0536) Resp:  [16-18] 18 (01/27 0536) BP: (120-128)/(64-67) 123/66 mmHg (01/27 0536) SpO2:  [100 %] 100 % (01/27 0536)  Intake/Output from previous day: 01/26 0701 - 01/27 0700 In: 3000 [I.V.:3000] Out: 3425 [Urine:3425] Intake/Output this shift:    Physical Exam: Work of breathing is not labored.  Minimal pain.  Feeling bowel activity and maybe some flatus.  Lab Results:  No results found for this or any previous visit (from the past 48 hour(s)).  Radiology/Results: No results found.  Anti-infectives: Anti-infectives    Start     Dose/Rate Route Frequency Ordered Stop   08/14/15 1445  cefoTEtan (CEFOTAN) 2 g in dextrose 5 % 50 mL IVPB     2 g 100 mL/hr over 30 Minutes Intravenous Every 12 hours 08/14/15 1431 08/14/15 1523   08/14/15 0512  cefoTEtan (CEFOTAN) 2 g in dextrose 5 % 50 mL IVPB     2 g 100 mL/hr over 30 Minutes Intravenous On call to O.R. 08/14/15 0512 08/14/15 0755      Assessment/Plan: Problem List: Patient Active Problem List   Diagnosis Date Noted  . S/P colectomy 08/14/2015  . Pain, low back 09/18/2014  . Cough 09/18/2014  . Advance care planning 01/23/2014  . Routine general medical examination at a health care facility 01/23/2014  . Anxiety state, unspecified 08/30/2013  . 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 Inflammatory arthritis ([place]) 10/18/2010  . JOINT EFFUSION, KNEE 01/26/2009  . Depression 08/03/2008  . SLEEPLESSNESS 08/03/2008  . ADVEF, DRUG/MED/BIOL SUBST, OTHER DRUG NOS 05/18/2007    Path showed negative nodes.  Discussed with her.  Advance to clears today.   3 Days Post-Op    LOS: 3 days    Matt B. Hassell Done, MD, [facility] [phone] beeper [phone]  08/17/2015 7:24 AM

## 2015-08-18 NOTE — Progress Notes (Signed)
Patient ID: Jacqueline Garza, female   DOB: Aug 26, 1960, 55 y.o.   MRN: [id number] [facility] Surgery Progress Note:   4 Days Post-Op  Subjective: Mental status is clear.  No flatus yet, hungry, still a little belchy. Objective: Vital signs in last 24 hours: Temp:  [98.1 F (36.7 C)-98.4 F (36.9 C)] 98.1 F (36.7 C) (01/28 0539) Pulse Rate:  [80-86] 80 (01/28 0539) Resp:  [16-18] 16 (01/28 0539) BP: (125-133)/(57-74) 132/74 mmHg (01/28 0539) SpO2:  [98 %-100 %] 98 % (01/28 0539)  Intake/Output from previous day: 01/27 0701 - 01/28 0700 In: 3220 [P.O.:220; I.V.:3000] Out: 1850 [Urine:1850] Intake/Output this shift:    Physical Exam: Work of breathing is not labored.  Minimal pain.   Abd soft, non tender, incision vac in place.  Sl distended.    Lab Results:  No results found for this or any previous visit (from the past 48 hour(s)).  Radiology/Results: No results found.  Anti-infectives: Anti-infectives    Start     Dose/Rate Route Frequency Ordered Stop   08/14/15 1445  cefoTEtan (CEFOTAN) 2 g in dextrose 5 % 50 mL IVPB     2 g 100 mL/hr over 30 Minutes Intravenous Every 12 hours 08/14/15 1431 08/14/15 1523   08/14/15 0512  cefoTEtan (CEFOTAN) 2 g in dextrose 5 % 50 mL IVPB     2 g 100 mL/hr over 30 Minutes Intravenous On call to O.R. 08/14/15 0512 08/14/15 0755      Assessment/Plan: Problem List: Patient Active Problem List   Diagnosis Date Noted  . S/P colectomy 08/14/2015  . Pain, low back 09/18/2014  . Cough 09/18/2014  . Advance care planning 01/23/2014  . Routine general medical examination at a health care facility 01/23/2014  . Anxiety state, unspecified 08/30/2013  . 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 Inflammatory arthritis ([place]) 10/18/2010  . JOINT EFFUSION, KNEE 01/26/2009  . Depression 08/03/2008  . SLEEPLESSNESS 08/03/2008  . ADVEF, DRUG/MED/BIOL SUBST, OTHER DRUG NOS 05/18/2007    Full liquids for dinner.   Await return of  bowel function.   Entereg   LOS: 4 days   [phone]  08/18/2015 8:58 AM

## 2015-08-19 MED ORDER — HYDROCODONE-ACETAMINOPHEN 5-325 MG PO TABS: 1.0000 | ORAL_TABLET | ORAL | Status: DC | PRN

## 2015-08-19 NOTE — Progress Notes (Signed)
Discharge instructions reviewed with patient utilizing teach back method. No questions at this time. Patient will be discharged to home

## 2015-08-19 NOTE — Progress Notes (Signed)
Patient ID: Jacqueline Garza, female   DOB: [birth year], 55 y.o.   MRN: [id number] 5 Days Post-Op  Subjective: Feels well today. Having bowel movements. Tolerating full liquid diet without nausea.  Objective: Vital signs in last 24 hours: Temp:  [97.6 F (36.4 C)-98.4 F (36.9 C)] 97.6 F (36.4 C) (01/29 0600) Pulse Rate:  [66-79] 66 (01/29 0600) Resp:  [16-18] 18 (01/29 0600) BP: (128-136)/(67-72) 130/67 mmHg (01/29 0600) SpO2:  [98 %-100 %] 98 % (01/29 0600) Last BM Date: 08/18/15  Intake/Output from previous day: 01/28 0701 - 01/29 0700 In: 3240 [P.O.:240; I.V.:3000] Out: 2150 [Urine:2150] Intake/Output this shift:    General appearance: alert, cooperative and no distress GI: normal findings: soft, non-tender Incision/Wound: dressing removed. Wound clean and dry.  Lab Results:  No results for input(s): WBC, HGB, HCT, PLT in the last 72 hours. BMET No results for input(s): NA, K, CL, CO2, GLUCOSE, BUN, CREATININE, CALCIUM in the last 72 hours.   Studies/Results: No results found.  Anti-infectives: Anti-infectives    Start     Dose/Rate Route Frequency Ordered Stop   08/14/15 1445  cefoTEtan (CEFOTAN) 2 g in dextrose 5 % 50 mL IVPB     2 g 100 mL/hr over 30 Minutes Intravenous Every 12 hours 08/14/15 1431 08/14/15 1523   08/14/15 0512  cefoTEtan (CEFOTAN) 2 g in dextrose 5 % 50 mL IVPB     2 g 100 mL/hr over 30 Minutes Intravenous On call to O.R. 08/14/15 0512 08/14/15 0755      Assessment/Plan: s/p Procedure(s): LAPAROSCOPIC ENTEROLYSIS AND MOBILIZATION OF SPLENIC FLEXURE; SIGMOID COLECTOMY WITH PRIMARY HAND SEWN ANASTOMOSIS;  ENDING LAPAROSCOPY AND INJECTION OF EXPAREL Doing well without complication. Okay for discharge.   LOS: 5 days    Infiniti Hoefling T 08/19/2015

## 2015-08-19 NOTE — Discharge Instructions (Signed)
[facility]      [facility] [phone]  OPEN ABDOMINAL SURGERY: POST OP INSTRUCTIONS  Always review your discharge instruction sheet given to you by the facility where your surgery was performed.  IF YOU HAVE DISABILITY OR FAMILY LEAVE FORMS, YOU MUST BRING THEM TO THE OFFICE FOR PROCESSING.  PLEASE DO NOT GIVE THEM TO YOUR DOCTOR.  1. A prescription for pain medication may be given to you upon discharge.  Take your pain medication as prescribed, if needed.  If narcotic pain medicine is not needed, then you may take acetaminophen (Tylenol) or ibuprofen (Advil) as needed. 2. Take your usually prescribed medications unless otherwise directed. 3. If you need a refill on your pain medication, please contact your pharmacy. They will contact our office to request authorization.  Prescriptions will not be filled after 5pm or on week-ends. 4. You should follow a light diet the first few days after arrival home, such as soup and crackers, pudding, etc.unless your doctor has advised otherwise. A high-fiber, low fat diet can be resumed as tolerated.   Be sure to include lots of fluids daily. Most patients will experience some swelling and bruising on the chest and neck area.  Ice packs will help.  Swelling and bruising can take several days to resolve 5. Most patients will experience some swelling and bruising in the area of the incision. Ice pack will help. Swelling and bruising can take several days to resolve..  6. It is common to experience some constipation if taking pain medication after surgery.  Increasing fluid intake and taking a stool softener will usually help or prevent this problem from occurring.  A mild laxative (Milk of Magnesia or Miralax) should be taken according to package directions if there are no bowel movements after 48 hours. 7.  You may have steri-strips (small skin tapes) in place directly over the incision.  These strips should be left on the skin for 7-10 days.  If your  surgeon used skin glue on the incision, you may shower in 24 hours.  The glue will flake off over the next 2-3 weeks.  Any sutures or staples will be removed at the office during your follow-up visit. You may find that a light gauze bandage over your incision may keep your staples from being rubbed or pulled. You may shower and replace the bandage daily. 8. ACTIVITIES:  You may resume regular (light) daily activities beginning the next day--such as daily self-care, walking, climbing stairs--gradually increasing activities as tolerated.  You may have sexual intercourse when it is comfortable.  Refrain from any heavy lifting or straining until approved by your doctor. a. You may drive when you no longer are taking prescription pain medication, you can comfortably wear a seatbelt, and you can safely maneuver your car and apply brakes b. Return to Work: ___________________________________ 9. You should see your doctor in the office for a follow-up appointment approximately two weeks after your surgery.  Make sure that you call for this appointment within a day or two after you arrive home to insure a convenient appointment time. OTHER INSTRUCTIONS:  _____________________________________________________________ _____________________________________________________________  WHEN TO CALL YOUR DOCTOR: 1. Fever over 101.0 2. Inability to urinate 3. Nausea and/or vomiting 4. Extreme swelling or bruising 5. Continued bleeding from incision. 6. Increased pain, redness, or drainage from the incision. 7. Difficulty swallowing or breathing 8. Muscle cramping or spasms. 9. Numbness or tingling in hands or feet or around lips.  The clinic staff is available to   answer your questions during regular business hours.  Please don't hesitate to call and ask to speak to one of the nurses if you have concerns.  For further questions, please visit [email or web address]   

## 2015-08-20 ENCOUNTER — Telehealth: Payer: Self-pay | Admitting: *Deleted

## 2015-08-20 NOTE — Telephone Encounter (Signed)
  Oncology Nurse Navigator Documentation  Navigator Location: [facility] (08/20/15 1449) Navigator Encounter Type: Telephone (08/20/15 1449) Telephone: Lahoma Crocker Call;Appt Confirmation/Clarification (08/20/15 1449)   Spoke with patient and provided new patient appointment for 09/03/15 at 2 pm with Dr. Benay Spice. Informed of location of [place], valet service, and registration process. Reminded to bring insurance cards and a current medication list, including supplements. Patient verbalizes understanding.

## 2015-08-21 ENCOUNTER — Encounter (HOSPITAL_COMMUNITY): Payer: Self-pay

## 2015-09-03 ENCOUNTER — Encounter: Payer: Self-pay | Admitting: *Deleted

## 2015-09-03 ENCOUNTER — Ambulatory Visit (HOSPITAL_BASED_OUTPATIENT_CLINIC_OR_DEPARTMENT_OTHER): Payer: BLUE CROSS/BLUE SHIELD | Admitting: Oncology

## 2015-09-03 ENCOUNTER — Encounter: Payer: Self-pay | Admitting: Oncology

## 2015-09-03 VITALS — BP 133/68 | HR 77 | Temp 98.2°F | Resp 18 | Ht 61.0 in | Wt 144.1 lb

## 2015-09-03 DIAGNOSIS — Z85038 Personal history of other malignant neoplasm of large intestine: Secondary | ICD-10-CM | POA: Insufficient documentation

## 2015-09-03 DIAGNOSIS — C187 Malignant neoplasm of sigmoid colon: Secondary | ICD-10-CM | POA: Diagnosis not present

## 2015-09-03 NOTE — Progress Notes (Signed)
Oncology Nurse Navigator Documentation  Oncology Nurse Navigator Flowsheets 09/03/2015  Navigator Location [facility]  Navigator Encounter Type Initial MedOnc  Telephone -  Abnormal Finding Date 07/26/2015  Confirmed Diagnosis Date 07/26/2015  Surgery Date 08/14/2015  Patient Visit Type MedOnc;Initial  Barriers/Navigation Needs No barriers at this time;Family concerns--new diagnosis and prognosis;follow up care  Interventions Education Method  Education Method Verbal;Written;Teach-back  Support Groups/Services GI Support Group;[employer]--Personal Child psychotherapist  Acuity Level 1 Initial guidance, education and coordination as needed;Minimal follow up required  Time Spent with Patient 51   Met with patient and husband during new patient visit. Explained the role of the GI Nurse Navigator and provided New Patient Packet with information on: 1. Colon cancer--CEA testing fact sheet 2. Support groups 3. Advanced Directives 4. Fall Safety Plan Answered questions, reviewed current treatment plan using TEACH back and provided emotional support. Provided copy of current treatment plan. She requires not follow up with medical oncology. Encouraged her to call office if she has questions.  Merceda Elks, RN, BSN GI Oncology [place]

## 2015-09-03 NOTE — Progress Notes (Signed)
[place] Patient Consult   Referring MD: Lennis Rader 55 y.o.  Jul 10, 1961    Reason for Referral: Colon cancer   HPI: She was noted to have a Hemoccult positive stool on a routine physical. She was referred to Dr. Havery Moros and was taken for colonoscopy 07/26/2015. Polyps were removed at the ileocecal valve, ascending colon, and splenic flexure. A mass was noted at the distal sigmoid colon at 20-25 cm from the anal verge. The mass was biopsied and tattooed. A 5 mm polyp was removed from the rectum. The pathology from the polyps returned as a tubular adenomas and a hyperplastic polyp. The sigmoid mass was suspicious for invasive adenocarcinoma.  CTs of the chest, abdomen, and pelvis on 08/01/2015 revealed fatty infiltration of the liver. The sigmoid colon mass could not be seen on the CT. No lymphadenopathy.  She was taken the operating room 08/14/2015 and underwent a sigmoid colectomy after lysis of adhesions. The uterus was stuck to the anterior abdominal wall. A mass was palpated in the sigmoid colon. No adenopathy.  The pathology ([id number]) revealed an invasive adenocarcinoma of the sigmoid colon. Tumor invaded through the muscularis propria into pericolonic tissue. Lymphovascular and perineural invasion were not identified. 0 of 12 lymph nodes contained metastatic carcinoma. The resection margins were negative. No tumor deposits. No macroscopic tumor perforation. 2 hyperplastic polyps were noted. The tumor was grade 2. There is no loss of mismatch were paraprotein expression. The tumor returned microsatellite stable.  She reports an uneventful operative recovery. She felt well prior to surgery.   Past Medical History  Diagnosis Date  . Depression   . Edema     with occ HCTZ use  . 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 Rheumatoid arthritis(714.0)     with prev MTX intolerance; per Dr. Jefm [facility] clinic  . Wears glasses   .  GERD (gastroesophageal reflux disease)   . History of hiatal hernia   . 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   G2 P2   .   Sigmoid colon cancer (T3 N0)                                                                           08/14/2015  Past Surgical History  Procedure Laterality Date  . Tubal ligation    . Cesarean section    . Colonscopy       removed polyps  . Laparoscopic sigmoid colectomy N/A 08/14/2015    Procedure: LAPAROSCOPIC ENTEROLYSIS AND MOBILIZATION OF SPLENIC FLEXURE; SIGMOID COLECTOMY WITH PRIMARY HAND SEWN ANASTOMOSIS;  ENDING LAPAROSCOPY AND INJECTION OF EXPAREL;  Surgeon: Johnathan Hausen, MD;  Location: [facility];  Service: General;  Laterality: N/A;    Medications: Reviewed  Allergies:  Allergies  Allergen Reactions  . Hydroxychloroquine Itching  . Methotrexate Derivatives Other (See Comments)    Elevated liver test  . Sulfa Antibiotics Itching  . Tessalon [Benzonatate] Other (See Comments)    Lack of effect.     Family history: Her sister died of leukemia at age 75. A cousin  died of breast cancer 79s. No other family history of cancer.  Social History:  She lives in [place]. She is a Agricultural engineer. She does not smoke cigarettes. Occasional alcohol use. No transfusion history. No risk factor for HIV or hepatitis.  ROS:   Positives include: Nausea and the mornings after drinking coffee-prior to surgery, diarrhea/constipation prior to surgery.  A complete ROS was otherwise negative.  Physical Exam:  Blood pressure 133/68, pulse 77, temperature 98.2 F (36.8 C), temperature source Oral, resp. rate 18, height 5' 1" (1.549 m), weight 144 lb 1.6 oz (65.363 kg), last menstrual period 08/19/2010, SpO2 100 %.  HEENT: Oropharynx without visible mass, neck without mass Lungs: Clear bilaterally Cardiac: Regular rate and rhythm Abdomen: No hepatosplenomegaly, no mass, mild tenderness in the left lower abdomen, healed  surgical incisions  Vascular: No leg edema Lymph nodes: No cervical, supraclavicular, axillary, or inguinal nodes Neurologic: Alert and oriented, the motor exam appears intact in the upper and lower extremities Skin: No rash Musculoskeletal: No spine tenderness   LAB:  CBC  07/12/2015-hemolymph 13.7, MCV 81.3  CMP      Component Value Date/Time   NA 141 08/15/2015 0407   K 3.9 08/15/2015 0407   CL 109 08/15/2015 0407   CO2 26 08/15/2015 0407   GLUCOSE 140* 08/15/2015 0407   BUN 6 08/15/2015 0407   CREATININE 0.79 08/15/2015 0407   CREATININE 0.78 04/30/2015 1010   CALCIUM 8.2* 08/15/2015 0407   GFRNONAA >60 08/15/2015 0407   GFRAA >60 08/15/2015 0407    07/27/2015-CEA 0.6  Imaging:  As per history of present illness   Assessment/Plan:   1. Adenocarcinoma of the sigmoid colon, stage IIa (T3 N0), status post a sigmoid colectomy 08/14/2015  Microsatellite stable, no loss of mismatch repair protein expression  2.   Depression  3.   Rheumatoid arthritis  4.   Multiple colon polyps on colonoscopy 07/26/2015   Disposition:   Ms. Spade has been diagnosed with stage II colon cancer. I discussed the prognosis and reviewed the details of the surgical pathology report with Ms. Grabinski and her husband. She has a good prognosis for a long-term disease-free survival. I discussed the lack of clear data to support the use of adjuvant chemotherapy in patients with resected stage II colon cancer. Her tumor does not have "high-risk "features. There is a small potential absolute benefit from adjuvant chemotherapy in this setting. I do not recommend adjuvant chemotherapy. She is comfortable being followed with observation.  She does not appear to have hereditary non-polyposis colon cancer syndrome. She will alert family members of her diagnosis so they can receive appropriate screening for colon cancer.  We discussed diet and exercise maneuvers that may decrease the risk of  developing colon cancer. She should have a surveillance colonoscopy in one year.  She would like to continue clinical follow-up with Dr. Damita Dunnings. I recommend a CEA every 6 months for 3 years and then yearly up to 5 years from diagnosis.  We discussed the potential increased risk of developing "cancer "if she were placed on immunosuppressive therapy for rheumatoid arthritis. She will discuss this with her treating physician.  Ms. Holladay is not scheduled for a follow-up appointment at the [facility]. I am available to see her in the future as needed.  Approximately 50 minutes were spent with the patient today. The majority of the time was used for counseling and coordination of care.  Betsy Coder, MD  09/03/2015, 4:51 PM

## 2015-09-04 ENCOUNTER — Telehealth: Payer: Self-pay | Admitting: Family Medicine

## 2015-09-04 DIAGNOSIS — C187 Malignant neoplasm of sigmoid colon: Secondary | ICD-10-CM

## 2015-09-04 DIAGNOSIS — Z85038 Personal history of other malignant neoplasm of large intestine: Secondary | ICD-10-CM

## 2015-09-04 NOTE — Telephone Encounter (Signed)
Please call pt.  CEA be checked every 6 months for the first 3 years and then yearly up to 5 years from diagnosis. She should have a colonoscopy in one year.  I put in the orders for the CEA.  We'll set up the colonoscopy next year.   Thanks.

## 2015-09-04 NOTE — Telephone Encounter (Signed)
Patient advised. Appointment scheduled for labs.

## 2015-09-07 NOTE — Discharge Summary (Signed)
Physician Discharge Summary  Patient ID: Jacqueline Garza MRN: [id number] DOB/AGE: 08-04-1960 55 y.o.  Admit date: 08/14/2015 Discharge date: 09/07/2015  Admission Diagnoses:  Sigmoid colon cancer  Discharge Diagnoses:  Same; node negative  Active Problems:   S/P colectomy   Surgery:  Lap assisted sigmoid colectomy  Discharged Condition: improved  Hospital Course:   Had lap assisted sigmoid colectomy.  Nodes negative but cancer was T3.  Incision healed nicely.  Bowel function returned promptly and she went home.    Consults: none  Significant Diagnostic Studies: Path    Discharge Exam: Blood pressure 130/67, pulse 66, temperature 97.6 F (36.4 C), temperature source Oral, resp. rate 18, height 5\' 1"  (1.549 m), weight 65.318 kg (144 lb), last menstrual period 08/19/2010, SpO2 98 %. Abdominal wall OK at time of discharge.  No complaints per my parteners  Disposition: 01-Home or Self Care  Discharge Instructions    Discharge patient    Complete by:  As directed             Medication List    STOP taking these medications        SUPREP BOWEL PREP Soln  Generic drug:  Na Sulfate-K Sulfate-Mg Sulf      TAKE these medications        clobetasol cream 0.05 %  Commonly known as:  TEMOVATE  Apply 1 application topically daily.     DULoxetine 60 MG capsule  Commonly known as:  CYMBALTA  TAKE 1 CAPSULE BY MOUTH DAILY     DULoxetine 30 MG capsule  Commonly known as:  CYMBALTA  TAKE ONE CAPSULE BY MOUTH EVERY DAY TAKE WITH 60MG  CAPSULE FOR TOTAL OF 90 MGS     gabapentin 100 MG capsule  Commonly known as:  NEURONTIN  Take 1 capsule (100 mg total) by mouth 3 (three) times daily as needed.           Follow-up Information    Follow up with Johnathan Hausen B, MD. Go in 1 week.   Specialty:  General Surgery   Contact information:   [place] [address] [phone]       Signed: Pedro Earls 09/07/2015, 7:35 AM

## 2015-09-10 ENCOUNTER — Encounter: Payer: Self-pay | Admitting: Family Medicine

## 2015-10-16 ENCOUNTER — Other Ambulatory Visit: Payer: BLUE CROSS/BLUE SHIELD

## 2015-11-07 ENCOUNTER — Encounter

## 2015-11-07 MED ORDER — BUPROPION HCL ER (XL) 300 MG PO TB24
300 MG | ORAL_TABLET | ORAL | 0 refills | Status: DC
Start: 2015-11-07 — End: 2016-01-04

## 2016-01-04 ENCOUNTER — Ambulatory Visit
Admit: 2016-01-04 | Discharge: 2016-01-04 | Payer: BLUE CROSS/BLUE SHIELD | Attending: Family Medicine | Primary: Family Medicine

## 2016-01-04 DIAGNOSIS — M159 Polyosteoarthritis, unspecified: Secondary | ICD-10-CM

## 2016-01-04 MED ORDER — IBUPROFEN 800 MG PO TABS
800 MG | ORAL_TABLET | Freq: Three times a day (TID) | ORAL | 1 refills | Status: DC | PRN
Start: 2016-01-04 — End: 2016-07-05

## 2016-01-04 MED ORDER — BUPROPION HCL ER (XL) 300 MG PO TB24
300 MG | ORAL_TABLET | ORAL | 1 refills | Status: DC
Start: 2016-01-04 — End: 2016-07-06

## 2016-01-04 MED ORDER — FAMCICLOVIR 500 MG PO TABS
500 MG | ORAL_TABLET | Freq: Every day | ORAL | 1 refills | Status: DC
Start: 2016-01-04 — End: 2016-08-13

## 2016-01-04 MED ORDER — NICOTINE 21 MG/24HR TD PT24
21 MG/24HR | MEDICATED_PATCH | TRANSDERMAL | 1 refills | Status: DC
Start: 2016-01-04 — End: 2018-04-19

## 2016-01-04 NOTE — Progress Notes (Signed)
SUBJECTIVE  Jane Romero is a 55 y.o. female.    HPI/Chief C/O:  Chief Complaint   Patient presents with   ??? Medication Refill   ??? Nicotine Dependence   ??? Health Maintenance     Reviewed; due for tdap, colon, mammo, and out of pneumo 23; declined mammogram and fit pended     Allergies   Allergen Reactions   ??? Codeine Itching   HPI Comments: She is here for a med list review and refills  We will do labs for future care planning  She asked for help to stop smoking       ROS:  Review of Systems   Constitutional: Negative.    HENT: Negative for congestion, ear discharge, ear pain, hearing loss, nosebleeds, sore throat and tinnitus.    Eyes: Negative for blurred vision, double vision, photophobia, pain, discharge and redness.   Respiratory: Negative for cough, hemoptysis, sputum production, shortness of breath, wheezing and stridor.    Cardiovascular: Negative for chest pain, palpitations, orthopnea, claudication, leg swelling and PND.   Gastrointestinal: Negative for abdominal pain, blood in stool, constipation, diarrhea, heartburn, melena, nausea and vomiting.   Genitourinary: Negative.    Musculoskeletal: Positive for joint pain. Negative for back pain, falls, myalgias and neck pain.   Skin: Negative.    Neurological: Negative for dizziness, tingling, tremors, sensory change, speech change, focal weakness, seizures, loss of consciousness and headaches.   Endo/Heme/Allergies: Negative for environmental allergies and polydipsia. Does not bruise/bleed easily.   Psychiatric/Behavioral: Negative.      Past Medical/Surgical Hx;  Reviewed with patient  History reviewed. No pertinent past medical history.  History reviewed. No pertinent surgical history.    Past Family Hx:  Reviewed with patient  History reviewed. No pertinent family history.    Social Hx:  Reviewed with patient  Social History   Substance Use Topics   ??? Smoking status: Current Every Day Smoker     Packs/day: 0.25     Types: Cigarettes   ??? Smokeless  tobacco: Never Used   ??? Alcohol use No       OBJECTIVE  BP 134/74   Pulse 71   Ht  (1.676 m)   Wt 110 lb (49.9 kg)   SpO2 98%   Breastfeeding? No   BMI 17.75 kg/m2    Problem List:  Jane Romero  does not have any pertinent problems on file.    PHYS EX:  Physical Exam   Constitutional: She is oriented to person, place, and time. She appears well-developed and well-nourished. No distress.   HENT:   Head: Normocephalic and atraumatic.   Right Ear: External ear normal.   Left Ear: External ear normal.   Nose: Nose normal.   Mouth/Throat: Oropharynx is clear and moist. No oropharyngeal exudate.   Eyes: Conjunctivae and EOM are normal. Pupils are equal, round, and reactive to light. Right eye exhibits no discharge. Left eye exhibits no discharge. No scleral icterus.   Neck: Normal range of motion. Neck supple. No JVD present. No tracheal deviation present. No thyromegaly present.   Cardiovascular: Normal rate, regular rhythm and normal heart sounds.  Exam reveals no gallop and no friction rub.    No murmur heard.  Pulmonary/Chest: Effort normal and breath sounds normal. No respiratory distress. She has no wheezes. She has no rales. She exhibits no tenderness.   Abdominal: Soft. Bowel sounds are normal. She exhibitsMaxie Jane Romero and no mass. There is no tenderness. There is no rebound and  no guarding. No hernia.   Musculoskeletal: Normal range of motion. She exhibits no edema, tenderness (bilateral knee pain) or deformity.   Lymphadenopathy:     She has no cervical adenopathy.   Neurological: She is alert and oriented to person, place, and time. She has normal reflexes. She displays normal reflexes. No cranial nerve deficit. She exhibits normal muscle tone. Coordination normal.   Skin: Skin is warm. No rash noted. She is not diaphoretic. No erythema. No pallor.   Psychiatric:   Mood is stable   Nursing note and vitals reviewed.    ASSESSMENT/PLAN  Jane Romero was seen today for medication refill, nicotine dependence and health  maintenance.    Diagnoses and all orders for this visit:    Primary osteoarthritis involving multiple joints  -     ibuprofen (ADVIL;MOTRIN) 800 MG tablet; Take 1 tablet by mouth every 8 hours as needed for Pain  -     PLAN--inject right and left knees x 2                     1/2 cc xylocaine plus 1 cc depo medrol                      Omt/ultra--Rx    Depression, unspecified depression type  -     buPROPion (WELLBUTRIN XL) 300 MG extended release tablet; TAKE 1 TABLET BY MOUTH EVERY MORNING    Herpes genitalis in women  -     famciclovir (FAMVIR) 500 MG tablet; Take 1 tablet by mouth daily    IFG (impaired fasting glucose)  -     Comprehensive Metabolic Panel; Future  -     CBC Auto Differential; Future  -     Hemoglobin A1C; Future    Dyslipidemia  -     Comprehensive Metabolic Panel; Future  -     Lipid Panel; Future  -     CBC Auto Differential; Future    Other fatigue  -     TSH without Reflex; Future  -     US Thyroid; Future    Screen for colon cancer  -     POCT Fecal Immunochemical Test (FIT); Future    Other orders  -     Cancel: POCT Fit Test; Future        Outpatient Encounter Prescriptions as of 01/04/2016   Medication Sig Dispense Refill   ??? buPROPion (WELLBUTRIN XL) 300 MG extended release tablet TAKE 1 TABLET BY MOUTH EVERY MORNING 90 tablet 1   ??? famciclovir (FAMVIR) 500 MG tablet Take 1 tablet by mouth daily 90 tablet 1   ??? ibuprofen (ADVIL;MOTRIN) 800 MG tablet Take 1 tablet by mouth every 8 hours as needed for Pain 270 tablet 1   ??? nicotine (NICODERM CQ) 21 MG/24HR Place 1 patch onto the skin every 24 hours 30 patch 1   ??? [DISCONTINUED] buPROPion (WELLBUTRIN XL) 300 MG extended release tablet TAKE 1 TABLET BY MOUTH EVERY MORNING 30 tablet 0   ??? [DISCONTINUED] famciclovir (FAMVIR) 500 MG tablet Take 1 tablet by mouth daily 90 tablet 1   ??? [DISCONTINUED] ibuprofen (ADVIL;MOTRIN) 800 MG tablet Take 1 tablet by mouth every 8 hours as needed 270 tablet 1     No facility-administered encounter  medications on file as of 01/04/2016.        Return in about 6 months (around 07/05/2016).        Reviewed recent labs related  to [facility] current problems      Discussed importance of regular Health Maintenance follow up  Health Maintenance   Topic   ??? Hepatitis C screen    ??? HIV screen    ??? Pneumococcal med risk (1 of 1 - PPSV23)   ??? DTaP/Tdap/Td vaccine (1 - Tdap)   ??? Colon Cancer Screen FIT/FOBT    ??? Breast cancer screen    ??? Lipid screen    ??? Cervical cancer screen    ??? Flu vaccine

## 2016-01-04 NOTE — Patient Instructions (Addendum)
Learning About High Cholesterol  What is high cholesterol?  Cholesterol is a type of fat in your blood. It is needed for many body functions, such as making new cells. Cholesterol is made by your body. It also comes from food you eat.  If you have too much cholesterol, it starts to build up in your arteries. This is called hardening of the arteries, or atherosclerosis. High cholesterol raises your risk of a heart attack and stroke.  There are different types of cholesterol. LDL is the "bad" cholesterol. High LDL can raise your risk for heart disease, heart attack, and stroke. HDL is the "good" cholesterol. High HDL is linked with a lower risk for heart disease, heart attack, and stroke.  Your cholesterol levels help your doctor find out your risk for having a heart attack or stroke.  How can you prevent high cholesterol?  A heart-healthy lifestyle can help you prevent high cholesterol. This lifestyle helps lower your risk for a heart attack and stroke.  ?? Eat heart-healthy foods.  ?? Eat fruits, vegetables, whole grains (like oatmeal), dried beans and peas, nuts and seeds, soy products (like tofu), and fat-free or low-fat dairy products.  ?? Replace butter, margarine, and hydrogenated or partially hydrogenated oils with olive and canola oils. (Canola oil margarine without trans fat is fine.)  ?? Replace red meat with fish, poultry, and soy protein (like tofu).  ?? Limit processed and packaged foods like chips, crackers, and cookies.  ?? Be active. Exercise can improve your cholesterol level. Get at least 30 minutes of exercise on most days of the week. Walking is a good choice. You also may want to do other activities, such as running, swimming, cycling, or playing tennis or team sports.  ?? Stay at a healthy weight. Lose weight if you need to.  ?? Don't smoke. If you need help quitting, talk to your doctor about stop-smoking programs and medicines. These can increase your chances of quitting for good.  How is high  cholesterol treated?  The goal of treatment is to reduce your chances of having a heart attack or stroke. The goal is not to lower your cholesterol numbers only.  ?? You may make lifestyle changes, such as eating healthy foods, not smoking, losing weight, and being more active.  ?? You may have to take medicine.  Follow-up care is a key part of your treatment and safety. Be sure to make and go to all appointments, and call your doctor if you are having problems. It's also a good idea to know your test results and keep a list of the medicines you take.  Where can you learn more?  Go to [email or web address] and sign in to your MyChart account. Enter Q621 in the Search Health Information box to learn more about "Learning About High Cholesterol."     If you do not have an account, please click on the "Sign Up Now" link.  Current as of: August 16, 2014  Content Version: 11.2  ?? 2006-2017 [employer]. Care instructions adapted under license by [facility]. If you have questions about a medical condition or this instruction, always ask your healthcare professional. [employer] disclaims any warranty or liability for your use of this information.

## 2016-01-15 ENCOUNTER — Encounter

## 2016-01-15 ENCOUNTER — Inpatient Hospital Stay: Admit: 2016-01-15 | Attending: Family Medicine | Primary: Family Medicine

## 2016-01-15 DIAGNOSIS — R5383 Other fatigue: Secondary | ICD-10-CM

## 2016-01-15 LAB — COMPREHENSIVE METABOLIC PANEL
ALT: 21 U/L (ref 0–32)
AST: 27 U/L (ref 0–31)
Albumin: 4.7 g/dL (ref 3.5–5.2)
Alkaline Phosphatase: 51 U/L (ref 35–104)
Anion Gap: 12 mmol/L (ref 7–16)
BUN: 16 mg/dL (ref 6–20)
CO2: 24 mmol/L (ref 22–29)
Calcium: 9.2 mg/dL (ref 8.6–10.2)
Chloride: 108 mmol/L — ABNORMAL HIGH (ref 98–107)
Creatinine: 0.8 mg/dL (ref 0.5–1.0)
GFR African American: >60
GFR Non-African American: >60 mL/min/{1.73_m2} (ref >=60)
Glucose: 85 mg/dL (ref 74–109)
Potassium: 5 mmol/L (ref 3.5–5.0)
Sodium: 144 mmol/L (ref 132–146)
Total Bilirubin: 0.3 mg/dL (ref 0.0–1.2)
Total Protein: 7 g/dL (ref 6.4–8.3)

## 2016-01-15 LAB — CBC WITH AUTO DIFFERENTIAL
Basophils %: 0.4 % (ref 0.0–2.0)
Basophils Absolute: 0.02 E9/L (ref 0.00–0.20)
Eosinophils %: 3.3 % (ref 0.0–6.0)
Eosinophils Absolute: 0.18 E9/L (ref 0.05–0.50)
Hematocrit: 43.7 % (ref 34.0–48.0)
Hemoglobin: 15 g/dL (ref 11.5–15.5)
Immature Granulocytes #: 0.02 E9/L
Immature Granulocytes %: 0.4 % (ref 0.0–5.0)
Lymphocytes %: 31.2 % (ref 20.0–42.0)
Lymphocytes Absolute: 1.68 E9/L (ref 1.50–4.00)
MCH: 34.1 pg (ref 26.0–35.0)
MCHC: 34.3 % (ref 32.0–34.5)
MCV: 99.3 fL (ref 80.0–99.9)
MPV: 9 fL (ref 7.0–12.0)
Monocytes %: 8.5 % (ref 2.0–12.0)
Monocytes Absolute: 0.46 E9/L (ref 0.10–0.95)
Neutrophils %: 56.2 % (ref 43.0–80.0)
Neutrophils Absolute: 3.03 E9/L (ref 1.80–7.30)
Platelets: 206 E9/L (ref 130–450)
RBC: 4.4 E12/L (ref 3.50–5.50)
RDW: 13.2 fL (ref 11.5–15.0)
WBC: 5.4 E9/L (ref 4.5–11.5)

## 2016-01-15 LAB — LIPID PANEL
Cholesterol, Total: 194 mg/dL (ref 0–199)
HDL: 80 mg/dL (ref >40)
LDL Calculated: 101 mg/dL — ABNORMAL HIGH (ref 0–99)
Triglycerides: 63 mg/dL (ref 0–149)
VLDL Cholesterol Calculated: 13 mg/dL

## 2016-01-15 LAB — TSH: TSH: 0.792 u[IU]/mL (ref 0.270–4.200)

## 2016-01-15 LAB — HEMOGLOBIN A1C: Hemoglobin A1C: 5.4 % (ref 4.8–5.9)

## 2016-01-16 LAB — HEPATITIS PANEL, ACUTE
Hep A IgM: NONREACTIVE
Hep B Core Ab, IgM: NONREACTIVE
Hep B S Ag Interp: NONREACTIVE
Hep C Ab Interp: NONREACTIVE

## 2016-01-20 ENCOUNTER — Telehealth: Payer: Self-pay | Admitting: Family Medicine

## 2016-01-20 NOTE — Telephone Encounter (Signed)
Due for f/u CEA.  Order is in.  Thanks.  Lab appointment only.

## 2016-01-21 NOTE — Telephone Encounter (Signed)
Patient advised.  Lab appt scheduled.  

## 2016-01-25 ENCOUNTER — Other Ambulatory Visit (INDEPENDENT_AMBULATORY_CARE_PROVIDER_SITE_OTHER): Payer: BLUE CROSS/BLUE SHIELD

## 2016-01-25 DIAGNOSIS — Z85038 Personal history of other malignant neoplasm of large intestine: Secondary | ICD-10-CM

## 2016-01-25 DIAGNOSIS — E781 Pure hyperglyceridemia: Secondary | ICD-10-CM | POA: Diagnosis not present

## 2016-01-26 LAB — LIPID PANEL
CHOL/HDL RATIO: 3.3 ratio (ref <=5.0)
CHOLESTEROL: 219 mg/dL — AB (ref 125–200)
HDL: 67 mg/dL (ref >=46)
LDL Cholesterol: 128 mg/dL (ref <130)
Triglycerides: 119 mg/dL (ref <150)
VLDL: 24 mg/dL (ref <30)

## 2016-01-26 LAB — BASIC METABOLIC PANEL
BUN: 12 mg/dL (ref 7–25)
CHLORIDE: 101 mmol/L (ref 98–110)
CO2: 23 mmol/L (ref 20–31)
Calcium: 9.5 mg/dL (ref 8.6–10.4)
Creat: 0.8 mg/dL (ref 0.50–1.05)
GLUCOSE: 94 mg/dL (ref 65–99)
POTASSIUM: 4.5 mmol/L (ref 3.5–5.3)
SODIUM: 139 mmol/L (ref 135–146)

## 2016-01-26 LAB — CEA: CEA: 0.5 ng/mL

## 2016-02-04 ENCOUNTER — Encounter: Payer: BLUE CROSS/BLUE SHIELD | Attending: Family Medicine | Primary: Family Medicine

## 2016-03-04 ENCOUNTER — Other Ambulatory Visit: Payer: BLUE CROSS/BLUE SHIELD

## 2016-04-15 LAB — POCT FECAL IMMUNOCHEMICAL TEST (FIT): Occult Blood Fecal: NEGATIVE

## 2016-05-29 ENCOUNTER — Encounter: Payer: Self-pay | Admitting: Gastroenterology

## 2016-06-18 ENCOUNTER — Encounter: Payer: Self-pay | Admitting: Gastroenterology

## 2016-07-05 ENCOUNTER — Encounter

## 2016-07-06 ENCOUNTER — Encounter

## 2016-07-07 MED ORDER — IBUPROFEN 800 MG PO TABS
800 MG | ORAL_TABLET | Freq: Three times a day (TID) | ORAL | 0 refills | Status: DC | PRN
Start: 2016-07-07 — End: 2016-08-13

## 2016-07-07 MED ORDER — BUPROPION HCL ER (XL) 300 MG PO TB24
300 MG | ORAL_TABLET | ORAL | 0 refills | Status: DC
Start: 2016-07-07 — End: 2016-08-13

## 2016-07-21 ENCOUNTER — Other Ambulatory Visit: Payer: Self-pay | Admitting: Family Medicine

## 2016-07-21 DIAGNOSIS — C187 Malignant neoplasm of sigmoid colon: Secondary | ICD-10-CM

## 2016-07-21 NOTE — Progress Notes (Signed)
FYI to patient.  Due for CEA.  Ordered.  Thanks.  Left detailed message on voicemail of cell phone.  Mike Craze, CMA  07/22/16

## 2016-07-29 ENCOUNTER — Other Ambulatory Visit: Payer: Self-pay | Admitting: Family Medicine

## 2016-07-30 ENCOUNTER — Other Ambulatory Visit (INDEPENDENT_AMBULATORY_CARE_PROVIDER_SITE_OTHER): Payer: BLUE CROSS/BLUE SHIELD

## 2016-07-30 DIAGNOSIS — C187 Malignant neoplasm of sigmoid colon: Secondary | ICD-10-CM

## 2016-07-31 LAB — CEA: CEA: <0.5 ng/mL

## 2016-08-04 LAB — HM PAP SMEAR: HM Pap smear: NORMAL

## 2016-08-04 LAB — HM MAMMOGRAPHY: HM MAMMO: NORMAL (ref 0–4)

## 2016-08-13 ENCOUNTER — Ambulatory Visit
Admit: 2016-08-13 | Discharge: 2016-08-13 | Payer: BLUE CROSS/BLUE SHIELD | Attending: Family Medicine | Primary: Family Medicine

## 2016-08-13 DIAGNOSIS — M159 Polyosteoarthritis, unspecified: Secondary | ICD-10-CM

## 2016-08-13 MED ORDER — FAMCICLOVIR 500 MG PO TABS
500 MG | ORAL_TABLET | Freq: Every day | ORAL | 1 refills | Status: DC
Start: 2016-08-13 — End: 2017-02-25

## 2016-08-13 MED ORDER — IBUPROFEN 800 MG PO TABS
800 MG | ORAL_TABLET | Freq: Three times a day (TID) | ORAL | 5 refills | Status: DC | PRN
Start: 2016-08-13 — End: 2017-02-25

## 2016-08-13 MED ORDER — TOBRAMYCIN-DEXAMETHASONE 0.3-0.1 % OP SUSP
OPHTHALMIC | 0 refills | Status: AC
Start: 2016-08-13 — End: 2016-08-23

## 2016-08-13 MED ORDER — BUPROPION HCL ER (XL) 300 MG PO TB24
300 MG | ORAL_TABLET | ORAL | 1 refills | Status: DC
Start: 2016-08-13 — End: 2017-02-11

## 2016-08-13 NOTE — Progress Notes (Signed)
SUBJECTIVE  Jane Romero is a 56 y.o. female.    HPI/Chief C/O:  Chief Complaint   Patient presents with   ??? Check-Up   ??? Medication Refill   ??? Health Maintenance     Reviewed; out of flu and tdap; declined pneumo; [place] for mammo     Allergies   Allergen Reactions   ??? Codeine Itching   She has a sty to her right lower eyelid  She is here for med list review and refills  I will also set up labs      Knee Pain    The incident occurred more than 1 week ago. The pain is present in the left knee and right knee. The quality of the pain is described as burning and aching. The pain is at a severity of 8/10. The pain is moderate. The pain has been worsening since onset. Pertinent negatives include no inability to bear weight, loss of motion, loss of sensation, muscle weakness, numbness or tingling.       ROS:  Review of Systems   Constitutional: Negative.  Negative for chills, diaphoresis, fever, malaise/fatigue and weight loss.   HENT: Negative for congestion, ear discharge, ear pain, hearing loss, nosebleeds, sinus pain, sore throat and tinnitus.    Eyes: Negative for blurred vision, double vision, photophobia, pain, discharge and redness.        Sty to the inner right lower eyelid    Respiratory: Negative for cough, hemoptysis, sputum production, shortness of breath, wheezing and stridor.    Cardiovascular: Negative for chest pain, palpitations, orthopnea, claudication, leg swelling and PND.   Gastrointestinal: Negative for abdominal pain, blood in stool, constipation, diarrhea, heartburn, melena, nausea and vomiting.   Genitourinary: Negative.    Musculoskeletal: Positive for joint pain. Negative for back pain, falls, myalgias and neck pain.   Skin: Negative.  Negative for itching and rash.   Neurological: Negative for dizziness, tingling, tremors, sensory change, speech change, focal weakness, seizures, loss of consciousness, weakness, numbness and headaches.   Endo/Heme/Allergies: Negative for environmental  allergies and polydipsia. Does not bruise/bleed easily.   Psychiatric/Behavioral: Negative.      Past Medical/Surgical Hx;  Reviewed with patient  History reviewed. No pertinent past medical history.  History reviewed. No pertinent surgical history.    Past Family Hx:  Reviewed with patient  History reviewed. No pertinent family history.    Social Hx:  Reviewed with patient  Social History   Substance Use Topics   ??? Smoking status: Current Every Day Smoker     Packs/day: 0.25     Types: Cigarettes   ??? Smokeless tobacco: Never Used   ??? Alcohol use No       OBJECTIVE  BP 138/76    Pulse 74    Ht 5\' 6"  (1.676 m)    Wt 108 lb (49 kg)    SpO2 94%    Breastfeeding? No    BMI 17.43 kg/m??     Problem List:  Jane Romero  does not have any pertinent problems on file.    PHYS EX:  Physical Exam   Constitutional: She is oriented to person, place, and time. She appears well-developed and well-nourished. No distress.   HENT:   Head: Normocephalic and atraumatic.   Right Ear: External ear normal.   Left Ear: External ear normal.   Nose: Nose normal.   Mouth/Throat: Oropharynx is clear and moist. No oropharyngeal exudate.   Eyes: Conjunctivae and EOM are normal. Pupils are equal, round,  and reactive to light. Lids are everted and swept, no foreign bodies found. Right eye exhibits hordeolum. Right eye exhibits no chemosis, no discharge and no exudate. No foreign body present in the right eye. Left eye exhibits no chemosis, no discharge, no exudate and no hordeolum. No foreign body present in the left eye. Right conjunctiva is not injected. Right conjunctiva has no hemorrhage. Left conjunctiva is not injected. Left conjunctiva has no hemorrhage. No scleral icterus. Right eye exhibits normal extraocular motion and no nystagmus. Left eye exhibits normal extraocular motion and no nystagmus. Right pupil is round and reactive. Left pupil is round and reactive. Pupils are equal.   Sty right lower eyelid    Neck: Normal range of motion. Neck  supple. No JVD present. No tracheal deviation present. No thyromegaly present.   Cardiovascular: Normal rate, regular rhythm and normal heart sounds.  Exam reveals no gallop and no friction rub.    No murmur heard.  Pulmonary/Chest: Effort normal and breath sounds normal. No stridor. No respiratory distress. She has no wheezes. She has no rales. She exhibits no tenderness.   Abdominal: Soft. Bowel sounds are normal. She exhibits no distension and no mass. There is no tenderness. There is no rebound and no guarding. No hernia.   Musculoskeletal: Normal range of motion. She exhibits tenderness (bilateral knee pain). She exhibits no edema or deformity.   Lymphadenopathy:     She has no cervical adenopathy.   Neurological: She is alert and oriented to person, place, and time. She has normal reflexes. She displays normal reflexes. No cranial nerve deficit or sensory deficit. She exhibits normal muscle tone. Coordination normal.   Skin: Skin is warm. No rash noted. She is not diaphoretic. No erythema. No pallor.   Psychiatric:   Mood is stable   Nursing note and vitals reviewed.      ASSESSMENT/PLAN  Jane Romero was seen today for check-up, medication refill and health maintenance.    Diagnoses and all orders for this visit:    Primary osteoarthritis involving multiple joints  -     ibuprofen (ADVIL;MOTRIN) 800 MG tablet; Take 1 tablet by mouth every 8 hours as needed for Pain  -  PLAN--inject right and left knees x 2                 1/2 cc xylocaine plus 1 cc depo medrol                  Omt/ultra--Rx    Depression, unspecified depression type  -     buPROPion (WELLBUTRIN XL) 300 MG extended release tablet; TAKE 1 TABLET BY MOUTH EVERY MORNING    Herpes genitalis in women  -     famciclovir (FAMVIR) 500 MG tablet; Take 1 tablet by mouth daily    IFG (impaired fasting glucose)  -     Comprehensive Metabolic Panel; Future  -     CBC Auto Differential; Future  -     Hemoglobin A1C; Future    Dyslipidemia  -     Comprehensive  Metabolic Panel; Future  -     Lipid Panel; Future  -     CBC Auto Differential; Future    Fatigue, unspecified type  -     TSH without Reflex; Future    Hordeolum externum of right lower eyelid  -     tobramycin-dexamethasone (TOBRADEX) 0.3-0.1 % ophthalmic suspension; Place 1 drop into the right eye every 4 hours (while awake) for 10 days  Outpatient Encounter Prescriptions as of 08/13/2016   Medication Sig Dispense Refill   ??? ibuprofen (ADVIL;MOTRIN) 800 MG tablet Take 1 tablet by mouth every 8 hours as needed for Pain 120 tablet 5   ??? buPROPion (WELLBUTRIN XL) 300 MG extended release tablet TAKE 1 TABLET BY MOUTH EVERY MORNING 90 tablet 1   ??? famciclovir (FAMVIR) 500 MG tablet Take 1 tablet by mouth daily 90 tablet 1   ??? tobramycin-dexamethasone (TOBRADEX) 0.3-0.1 % ophthalmic suspension Place 1 drop into the right eye every 4 hours (while awake) for 10 days 1 Bottle 0   ??? gabapentin (NEURONTIN) 300 MG capsule Take 300 mg by mouth nightly.     ??? [DISCONTINUED] ibuprofen (ADVIL;MOTRIN) 800 MG tablet TAKE 1 TABLET BY MOUTH EVERY 8 HOURS AS NEEDED FOR PAIN 90 tablet 0   ??? [DISCONTINUED] buPROPion (WELLBUTRIN XL) 300 MG extended release tablet TAKE 1 TABLET BY MOUTH EVERY MORNING 30 tablet 0   ??? nicotine (NICODERM CQ) 21 MG/24HR Place 1 patch onto the skin every 24 hours 30 patch 1   ??? [DISCONTINUED] famciclovir (FAMVIR) 500 MG tablet Take 1 tablet by mouth daily 90 tablet 1     No facility-administered encounter medications on file as of 08/13/2016.        Return in about 6 months (around 02/10/2017).        [facility]      Discussed importance of regular Health Maintenance follow up  Health Maintenance   Topic   ??? HIV screen    ??? Pneumococcal med risk (1 of 1 - PPSV23)   ??? DTaP/Tdap/Td vaccine (1 - Tdap)   ??? Breast cancer screen    ??? Colon Cancer Screen FIT/FOBT    ??? Cervical cancer screen    ??? Lipid screen    ??? Flu vaccine    ??? Hepatitis C screen

## 2016-08-13 NOTE — Patient Instructions (Addendum)
Patient Education        Learning About the Mediterranean Diet  What is the Mediterranean diet?    The Mediterranean diet is a style of eating rather than a diet plan. It features foods eaten in [place], [place], southern [place] and [place], and other countries along the [place] Sea. It emphasizes eating foods like fish, fruits, vegetables, beans, high-fiber breads and whole grains, nuts, and olive oil. This style of eating includes limited red meat, cheese, and sweets.  Why choose the Mediterranean diet?  A Mediterranean-style diet may improve heart health. It contains more fat than other heart-healthy diets. But the fats are mainly from nuts, unsaturated oils (such as fish oils and olive oil), and certain nut or seed oils (such as canola, soybean, or flaxseed oil). These fats may help protect the heart and blood vessels.  How can you get started on the Mediterranean diet?  Here are some things you can do to switch to a more Mediterranean way of eating.  What to eat  ?? Eat a variety of fruits and vegetables each day, such as grapes, blueberries, tomatoes, broccoli, peppers, figs, olives, spinach, eggplant, beans, lentils, and chickpeas.  ?? Eat a variety of whole-grain foods each day, such as oats, brown rice, and whole wheat bread, pasta, and couscous.  ?? Eat fish at least 2 times a week. Try tuna, salmon, mackerel, lake trout, herring, or sardines.  ?? Eat moderate amounts of low-fat dairy products, such as milk, cheese, or yogurt.  ?? Eat moderate amounts of poultry and eggs.  ?? Choose healthy (unsaturated) fats, such as nuts, olive oil, and certain nut or seed oils like canola, soybean, and flaxseed.  ?? Limit unhealthy (saturated) fats, such as butter, palm oil, and coconut oil. And limit fats found in animal products, such as meat and dairy products made with whole milk. Try to eat red meat only a few times a month in very small amounts.  ?? Limit sweets and desserts to only a few times a week. This includes  sugar-sweetened drinks like soda.  The Mediterranean diet may also include red wine with your meal-1 glass each day for women and up to 2 glasses a day for men.  Tips for eating at home  ?? Use herbs, spices, garlic, lemon zest, and citrus juice instead of salt to add flavor to foods.  ?? Add avocado slices to your sandwich instead of bacon.  ?? Have fish for lunch or dinner instead of red meat. Brush the fish with olive oil, and broil or grill it.  ?? Sprinkle your salad with seeds or nuts instead of cheese.  ?? Cook with olive or canola oil instead of butter or oils that are high in saturated fat.  ?? Switch from 2% milk or whole milk to 1% or fat-free milk.  ?? Dip raw vegetables in a vinaigrette dressing or hummus instead of dips made from mayonnaise or sour cream.  ?? Have a piece of fruit for dessert instead of a piece of cake. Try baked apples, or have some dried fruit.  Tips for eating out  ?? Try broiled, grilled, baked, or poached fish instead of having it fried or breaded.  ?? Ask your server to have your meals prepared with olive oil instead of butter.  ?? Order dishes made with marinara sauce or sauces made from olive oil. Avoid sauces made from cream or mayonnaise.  ?? Choose whole-grain breads, whole wheat pasta and pizza crust, brown rice, beans, and   lentils.  ?? Cut back on butter or margarine on bread. Instead, you can dip your bread in a small amount of olive oil.  ?? Ask for a side salad or grilled vegetables instead of french fries or chips.  Where can you learn more?  Go to [email or web address] and sign in to your MyChart account. Enter O407 in the Search Health Information box to learn more about "Learning About the Mediterranean Diet."     If you do not have an account, please click on the "Sign Up Now" link.  Current as of: Nov 30, 2015  Content Version: 11.5  ?? 2006-2017 [employer]. Care instructions adapted under license by [facility]. If you have questions about a  medical condition or this instruction, always ask your healthcare professional. [employer] disclaims any warranty or liability for your use of this information.       Patient Education        Styes and Chalazia: Care Instructions  Your Care Instructions    Styes and chalazia (say "kuh-LAY-zee-uh") are both conditions that can cause swelling of the eyelid.  A stye is an infection in the root of an eyelash. The infection causes a tender red lump on the edge of the eyelid. The infection can spread until the whole eyelid becomes red and inflamed. Styes usually break open, and a tiny amount of pus drains. They usually clear up on their own in about a week, but they sometimes need treatment with antibiotics.  A chalazion is a lump or cyst in the eyelid (chalazion is singular; chalazia is plural). It is caused by swelling and inflammation of deep oil glands inside the eyelid. Chalazia are usually not infected. They can take a few months to heal.  If a chalazion becomes more swollen and painful or does not go away, you may need to have it drained by your doctor.  Follow-up care is a key part of your treatment and safety. Be sure to make and go to all appointments, and call your doctor if you are having problems. It's also a good idea to know your test results and keep a list of the medicines you take.  How can you care for yourself at home?  ?? Do not rub your eyes. Do not squeeze or try to open a stye or chalazion.  ?? To help a stye or chalazion heal faster:  ?? Put a warm, moist compress on your eye for 5 to 10 minutes, 3 to 6 times a day. Heat often brings a stye to a point where it drains on its own. Keep in mind that warm compresses will often increase swelling a little at first.  ?? Do not use hot water or heat a wet cloth in a microwave oven. The compress may get too hot and can burn the eyelid.  ?? Always wash your hands before and after you use a compress or touch your eyes.  ?? If the doctor gave you  antibiotic drops or ointment, use the medicine exactly as directed. Use the medicine for as long as instructed, even if your eye starts to feel better.  ?? To put in eyedrops or ointment:  ?? Tilt your head back, and pull your lower eyelid down with one finger.  ?? Drop or squirt the medicine inside the lower lid.  ?? Close your eye for 30 to 60 seconds to let the drops or ointment move around.  ?? Do not touch the ointment or dropper tip to your  eyelashes or any other surface.  ?? Do not wear eye makeup or contact lenses until the stye or chalazion heals.  ?? Do not share towels, pillows, or washcloths while you have a stye.  When should you call for help?  Call your doctor now or seek immediate medical care if:  ? ?? You have pain in your eye.   ? ?? You have a change in vision or loss of vision.   ? ?? Redness and swelling get much worse.   ?Watch closely for changes in your health, and be sure to contact your doctor if:  ? ?? Your stye does not get better in 1 week.   ? ?? Your chalazion does not start to get better after several weeks.   Where can you learn more?  Go to [email or web address] and sign in to your MyChart account. Enter [phone] in the Search Health Information box to learn more about "Styes and Chalazia: Care Instructions."     If you do not have an account, please click on the "Sign Up Now" link.  Current as of: September 21, 2015  Content Version: 11.5  ?? 2006-2017 [employer]. Care instructions adapted under license by [facility]. If you have questions about a medical condition or this instruction, always ask your healthcare professional. [employer] disclaims any warranty or liability for your use of this information.

## 2016-08-22 ENCOUNTER — Telehealth: Payer: Self-pay | Admitting: *Deleted

## 2016-08-22 ENCOUNTER — Ambulatory Visit: Payer: BLUE CROSS/BLUE SHIELD | Admitting: *Deleted

## 2016-08-22 VITALS — Ht 61.5 in | Wt 148.4 lb

## 2016-08-22 DIAGNOSIS — Z85038 Personal history of other malignant neoplasm of large intestine: Secondary | ICD-10-CM

## 2016-08-22 NOTE — Telephone Encounter (Signed)
That's okay, Miralax prep is alright, thanks for letting me know

## 2016-08-22 NOTE — Telephone Encounter (Signed)
Dr. Havery Moros,  Jacqueline Garza did not tolerate Suprep with last colonoscopy. Vomited both attempts at drinking.She did Miralax and dulcolax for colectomy and has asked if she can do the same for this colonoscopy. I have given her instructions for Miralax prep. Please advise if you prefer for this to be changed.

## 2016-08-22 NOTE — Progress Notes (Signed)
Denies allergies to eggs or soy products. Denies complications with sedation or anesthesia. Denies O2 use. Denies use of diet or weight loss medications.  Emmi instructions given for colonoscopy.  

## 2016-08-25 ENCOUNTER — Encounter: Payer: Self-pay | Admitting: Gastroenterology

## 2016-09-05 ENCOUNTER — Encounter: Payer: Self-pay | Admitting: Gastroenterology

## 2016-09-05 ENCOUNTER — Ambulatory Visit (AMBULATORY_SURGERY_CENTER): Payer: BLUE CROSS/BLUE SHIELD | Admitting: Gastroenterology

## 2016-09-05 VITALS — BP 141/62 | HR 62 | Temp 98.7°F | Resp 16 | Ht 61.5 in | Wt 148.0 lb

## 2016-09-05 DIAGNOSIS — K529 Noninfective gastroenteritis and colitis, unspecified: Secondary | ICD-10-CM | POA: Diagnosis not present

## 2016-09-05 DIAGNOSIS — K635 Polyp of colon: Secondary | ICD-10-CM | POA: Diagnosis not present

## 2016-09-05 DIAGNOSIS — K639 Disease of intestine, unspecified: Secondary | ICD-10-CM

## 2016-09-05 DIAGNOSIS — Z85038 Personal history of other malignant neoplasm of large intestine: Secondary | ICD-10-CM

## 2016-09-05 DIAGNOSIS — Z860100 Personal history of colon polyps, unspecified: Secondary | ICD-10-CM

## 2016-09-05 DIAGNOSIS — D123 Benign neoplasm of transverse colon: Secondary | ICD-10-CM

## 2016-09-05 LAB — HM COLONOSCOPY

## 2016-09-05 MED ORDER — SODIUM CHLORIDE 0.9 % IV SOLN: 500.0000 mL | INTRAVENOUS | Status: DC

## 2016-09-05 NOTE — Progress Notes (Signed)
A/ox3 pleased with MAC, report to Jane RN 

## 2016-09-05 NOTE — Patient Instructions (Signed)
Impression/Recommendations:  Polyp handout given to patient. Hemorrhoid handout given to patient.  Repeat colonoscopy recommended for surveillance.   Date to be determined after pathology results reviewed.  YOU HAD AN ENDOSCOPIC PROCEDURE TODAY AT [facility]:   Refer to the procedure report that was given to you for any specific questions about what was found during the examination.  If the procedure report does not answer your questions, please call your gastroenterologist to clarify.  If you requested that your care partner not be given the details of your procedure findings, then the procedure report has been included in a sealed envelope for you to review at your convenience later.  YOU SHOULD EXPECT: Some feelings of bloating in the abdomen. Passage of more gas than usual.  Walking can help get rid of the air that was put into your GI tract during the procedure and reduce the bloating. If you had a lower endoscopy (such as a colonoscopy or flexible sigmoidoscopy) you may notice spotting of blood in your stool or on the toilet paper. If you underwent a bowel prep for your procedure, you may not have a normal bowel movement for a few days.  Please Note:  You might notice some irritation and congestion in your nose or some drainage.  This is from the oxygen used during your procedure.  There is no need for concern and it should clear up in a day or so.  SYMPTOMS TO REPORT IMMEDIATELY:   Following lower endoscopy (colonoscopy or flexible sigmoidoscopy):  Excessive amounts of blood in the stool  Significant tenderness or worsening of abdominal pains  Swelling of the abdomen that is new, acute  Fever of 100F or higher  For urgent or emergent issues, a gastroenterologist can be reached at any hour by calling [phone].   DIET:  We do recommend a small meal at first, but then you may proceed to your regular diet.  Drink plenty of fluids but you should avoid alcoholic  beverages for 24 hours.  ACTIVITY:  You should plan to take it easy for the rest of today and you should NOT DRIVE or use heavy machinery until tomorrow (because of the sedation medicines used during the test).    FOLLOW UP: Our staff will call the number listed on your records the next business day following your procedure to check on you and address any questions or concerns that you may have regarding the information given to you following your procedure. If we do not reach you, we will leave a message.  However, if you are feeling well and you are not experiencing any problems, there is no need to return our call.  We will assume that you have returned to your regular daily activities without incident.  If any biopsies were taken you will be contacted by phone or by letter within the next 1-3 weeks.  Please call us at [phone] if you have not heard about the biopsies in 3 weeks.    SIGNATURES/CONFIDENTIALITY: You and/or your care partner have signed paperwork which will be entered into your electronic medical record.  These signatures attest to the fact that that the information above on your After Visit Summary has been reviewed and is understood.  Full responsibility of the confidentiality of this discharge information lies with you and/or your care-partner.

## 2016-09-05 NOTE — Op Note (Signed)
[facility] Patient Name: Jacqueline Garza Procedure Date: 09/05/2016 7:55 AM MRN: [id number] Endoscopist: Remo Lipps P. Raegan Winders MD, MD Age: 56 Referring MD:  Date of Birth: Nov 24, 1960 Gender: Female Account #: [id number] Procedure:                Colonoscopy Indications:              High risk colon cancer surveillance: Personal                            history of colon cancer with sigmoid resection one                            year ago Medicines:                Monitored Anesthesia Care Procedure:                Pre-Anesthesia Assessment:                           - Prior to the procedure, a History and Physical                            was performed, and patient medications and                            allergies were reviewed. The patient's tolerance of                            previous anesthesia was also reviewed. The risks                            and benefits of the procedure and the sedation                            options and risks were discussed with the patient.                            All questions were answered, and informed consent                            was obtained. Prior Anticoagulants: The patient has                            taken no previous anticoagulant or antiplatelet                            agents. ASA Grade Assessment: II - A patient with                            mild systemic disease. After reviewing the risks                            and benefits, the patient was deemed in  satisfactory condition to undergo the procedure.                           After obtaining informed consent, the colonoscope                            was passed under direct vision. Throughout the                            procedure, the patient's blood pressure, pulse, and                            oxygen saturations were monitored continuously. The                            Model PCF-[id number]) scope was  introduced                            through the anus and advanced to the the cecum,                            identified by appendiceal orifice and ileocecal                            valve. The colonoscopy was performed without                            difficulty. The patient tolerated the procedure                            well. The quality of the bowel preparation was                            adequate. The ileocecal valve, appendiceal orifice,                            and rectum were photographed. Scope In: 8:01:10 AM Scope Out: 8:29:16 AM Scope Withdrawal Time: 0 hours 23 minutes 51 seconds  Total Procedure Duration: 0 hours 28 minutes 6 seconds  Findings:                 The perianal and digital rectal examinations were                            normal.                           A 4 mm polyp was found in the transverse colon. The                            polyp was sessile. The polyp was removed with a                            cold snare. Resection and retrieval were complete.  There was evidence of a prior end-to-end                            colo-colonic anastomosis in the sigmoid colon. This                            was patent and was characterized by nodular                            polypoid tissue with suspected retained suture. I                            suspect this may likely be reactive / granulation                            tissue but biopsies were taken with a cold forceps                            for histology to ensure no adenomatous change /                            malignancy. Area was tattooed with an injection of                            Spot (carbon black).                           Internal hemorrhoids were found during                            retroflexion. The hemorrhoids were small.                           The bowel prep during insertion was only fair                            throughout the entire  colon with significant amount                            of retained liquid stool. A lot of time was spent                            lavaging the colon to achieve adequate views. The                            exam was otherwise without abnormality. Complications:            No immediate complications. Estimated blood loss:                            Minimal. Estimated Blood Loss:     Estimated blood loss was minimal. Impression:               - One 4 mm polyp in the transverse colon, removed  with a cold snare. Resected and retrieved.                           - Patent end-to-end colo-colonic anastomosis,                            characterized by nodular polypoid tissue. Biopsied.                            Tattooed.                           - Internal hemorrhoids.                           - Significant amount of retained stool leading to                            extensive lavage to obtain adequate views.                           - The examination was otherwise normal. Recommendation:           - Patient has a contact number available for                            emergencies. The signs and symptoms of potential                            delayed complications were discussed with the                            patient. Return to normal activities tomorrow.                            Written discharge instructions were provided to the                            patient.                           - Resume previous diet.                           - Continue present medications.                           - Await pathology results.                           - Repeat colonoscopy is recommended for                            surveillance. The colonoscopy date will be                            determined after pathology results from today's  exam become available for review. Remo Lipps P. Docie Abramovich MD, MD 09/05/2016 8:35:04 AM This  report has been signed electronically.

## 2016-09-05 NOTE — Progress Notes (Signed)
Called to room to assist during endoscopic procedure.  Patient ID and intended procedure confirmed with present staff. Received instructions for my participation in the procedure from the performing physician.  

## 2016-09-08 ENCOUNTER — Telehealth: Payer: Self-pay | Admitting: *Deleted

## 2016-09-08 NOTE — Telephone Encounter (Signed)
  Follow up Call-  Call back number 09/05/2016 07/26/2015  Post procedure Call Back phone  # [phone]  Permission to leave phone message Yes Yes  Some recent data might be hidden    [facility]

## 2016-09-08 NOTE — Telephone Encounter (Signed)
  Follow up Call-  Call back number 09/05/2016 07/26/2015  Post procedure Call Back phone  # [phone]  Permission to leave phone message Yes Yes  Some recent data might be hidden    [facility]

## 2016-09-11 ENCOUNTER — Encounter: Payer: Self-pay | Admitting: Gastroenterology

## 2017-01-18 ENCOUNTER — Other Ambulatory Visit: Payer: Self-pay | Admitting: Family Medicine

## 2017-01-18 DIAGNOSIS — E785 Hyperlipidemia, unspecified: Secondary | ICD-10-CM

## 2017-01-18 DIAGNOSIS — Z85038 Personal history of other malignant neoplasm of large intestine: Secondary | ICD-10-CM

## 2017-01-19 ENCOUNTER — Telehealth: Payer: Self-pay | Admitting: Family Medicine

## 2017-01-19 NOTE — Telephone Encounter (Signed)
Pt returned call. Please call [phone] Thanks

## 2017-01-22 ENCOUNTER — Other Ambulatory Visit (INDEPENDENT_AMBULATORY_CARE_PROVIDER_SITE_OTHER): Payer: BLUE CROSS/BLUE SHIELD

## 2017-01-22 DIAGNOSIS — Z85038 Personal history of other malignant neoplasm of large intestine: Secondary | ICD-10-CM

## 2017-01-22 DIAGNOSIS — E785 Hyperlipidemia, unspecified: Secondary | ICD-10-CM

## 2017-01-22 LAB — BASIC METABOLIC PANEL
BUN: 16 mg/dL (ref 6–23)
CHLORIDE: 101 meq/L (ref 96–112)
CO2: 24 mEq/L (ref 19–32)
CREATININE: 0.88 mg/dL (ref 0.40–1.20)
Calcium: 9.9 mg/dL (ref 8.4–10.5)
GFR: 70.21 mL/min (ref >60.00)
Glucose, Bld: 78 mg/dL (ref 70–99)
POTASSIUM: 4.2 meq/L (ref 3.5–5.1)
Sodium: 137 mEq/L (ref 135–145)

## 2017-01-22 LAB — LIPID PANEL
CHOLESTEROL: 227 mg/dL — AB (ref 0–200)
HDL: 51.8 mg/dL (ref >39.00)
LDL Cholesterol: 139 mg/dL — ABNORMAL HIGH (ref 0–99)
NonHDL: 175.04
TRIGLYCERIDES: 180 mg/dL — AB (ref 0.0–149.0)
Total CHOL/HDL Ratio: 4
VLDL: 36 mg/dL (ref 0.0–40.0)

## 2017-01-23 ENCOUNTER — Ambulatory Visit (INDEPENDENT_AMBULATORY_CARE_PROVIDER_SITE_OTHER): Payer: BLUE CROSS/BLUE SHIELD | Admitting: Family Medicine

## 2017-01-23 ENCOUNTER — Encounter: Payer: Self-pay | Admitting: Family Medicine

## 2017-01-23 DIAGNOSIS — F329 Major depressive disorder, single episode, unspecified: Secondary | ICD-10-CM

## 2017-01-23 DIAGNOSIS — F32A Depression, unspecified: Secondary | ICD-10-CM

## 2017-01-23 DIAGNOSIS — E785 Hyperlipidemia, unspecified: Secondary | ICD-10-CM

## 2017-01-23 DIAGNOSIS — Z85038 Personal history of other malignant neoplasm of large intestine: Secondary | ICD-10-CM

## 2017-01-23 LAB — CEA: CEA: 1 ng/mL

## 2017-01-23 MED ORDER — DULOXETINE HCL 30 MG PO CPEP: ORAL_CAPSULE | ORAL | 3 refills | Status: DC

## 2017-01-23 MED ORDER — DULOXETINE HCL 60 MG PO CPEP: 60.0000 mg | ORAL_CAPSULE | Freq: Every day | ORAL | 3 refills | Status: DC

## 2017-01-23 NOTE — Patient Instructions (Signed)
We'll let you know about your pending CEA.  Take care.  Glad to see you.  Update me as needed.

## 2017-01-23 NOTE — Progress Notes (Signed)
H/o colon cancer, s/p resection.  CEA not resulted yet, d/w pt.  No blood in stool.  No FCNAVD.  Prev CEA neg.  No abd pain.  No abdominal symptoms. Sugar/Cr wnl, labs d/w pt.   HLD, d/w pt.  Likely not at the point of needing a statin, d/w pt.  Diet and exercise d/w pt, encouraged, "I could do better."    Mood d/w pt.  Today is "fine."  She has occ sx with lack of motivation.  No SI/HI.  Her sx seem to be worse in the winter.  If her mood is worse then she'll let me know, d/w pt.  She may end up needing med adjustment for the winter.  No ADE on med.    Pap and mammogram done per Dr. Dellis Filbert 08/04/2016 per patient report.   PMH and SH reviewed  ROS: Per HPI unless specifically indicated in ROS section   Meds, vitals, and allergies reviewed.   GEN: nad, alert and oriented HEENT: mucous membranes moist NECK: supple w/o LA CV: rrr. PULM: ctab, no inc wob ABD: soft, +bs EXT: no edema SKIN: no acute rash  PHQ 0

## 2017-01-25 DIAGNOSIS — E785 Hyperlipidemia, unspecified: Secondary | ICD-10-CM | POA: Insufficient documentation

## 2017-01-25 NOTE — Assessment & Plan Note (Signed)
CEA not resulted yet, see notes on labs.    No blood in stool.  No FCNAVD.   No change in plan at this point. Discussed with patient. >25 minutes spent in face to face time with patient, >50% spent in counselling or coordination of care.

## 2017-01-25 NOTE — Assessment & Plan Note (Signed)
PHQ 0. Mood is fine at this point. If she has worse symptoms in the winter she will let us know. Consider med adjustment at that point. She agrees. Update me as needed.

## 2017-01-25 NOTE — Assessment & Plan Note (Signed)
Labs discussed with patient. Likely not at the point of needing a statin. She is going to work harder on diet and exercise. We can recheck episodically. She agrees.

## 2017-02-03 ENCOUNTER — Encounter

## 2017-02-11 ENCOUNTER — Encounter

## 2017-02-11 MED ORDER — BUPROPION HCL ER (XL) 300 MG PO TB24
300 | ORAL_TABLET | ORAL | 0 refills | Status: DC
Start: 2017-02-11 — End: 2017-02-25

## 2017-02-11 NOTE — Telephone Encounter (Signed)
Pt contacted office regarding refill of wellbutrin. MA sent a 30 day refill in. Pt is due for her 6 month visit and is scheduled on 8/8.    Electronically signed by Wonda ChengAmanda R Jasenia Weilbacher on 02/11/17 at 3:37 PM

## 2017-02-25 ENCOUNTER — Ambulatory Visit
Admit: 2017-02-25 | Discharge: 2017-02-25 | Payer: BLUE CROSS/BLUE SHIELD | Attending: Family Medicine | Primary: Family Medicine

## 2017-02-25 DIAGNOSIS — M159 Polyosteoarthritis, unspecified: Secondary | ICD-10-CM

## 2017-02-25 MED ORDER — IBUPROFEN 800 MG PO TABS
800 | ORAL_TABLET | Freq: Three times a day (TID) | ORAL | 5 refills | Status: DC | PRN
Start: 2017-02-25 — End: 2018-04-19

## 2017-02-25 MED ORDER — BUPROPION HCL ER (XL) 300 MG PO TB24
300 | ORAL_TABLET | ORAL | 1 refills | Status: DC
Start: 2017-02-25 — End: 2017-09-03

## 2017-02-25 MED ORDER — FAMCICLOVIR 500 MG PO TABS
500 | ORAL_TABLET | Freq: Every day | ORAL | 1 refills | Status: DC
Start: 2017-02-25 — End: 2017-08-27

## 2017-02-25 NOTE — Patient Instructions (Addendum)
Patient Education        Arthritis: Care Instructions  Your Care Instructions  Arthritis, also called osteoarthritis, is a breakdown of the cartilage that cushions your joints. When the cartilage wears down, your bones rub against each other. This causes pain and stiffness. Many people have some arthritis as they age. Arthritis most often affects the joints of the spine, hands, hips, knees, or feet.  You can take simple measures to protect your joints, ease your pain, and help you stay active.  Follow-up care is a key part of your treatment and safety. Be sure to make and go to all appointments, and call your doctor if you are having problems. It's also a good idea to know your test results and keep a list of the medicines you take.  How can you care for yourself at home?   Stay at a healthy weight. Being overweight puts extra strain on your joints.   Talk to your doctor or physical therapist about exercises that will help ease joint pain.   Stretch. You may enjoy gentle forms of yoga to help keep your joints and muscles flexible.   Walk instead of jog. Other types of exercise that are less stressful on the joints include riding a bicycle, swimming, tai chi, or water exercise.   Lift weights. Strong muscles help reduce stress on your joints. Stronger thigh muscles, for example, take some of the stress off of the knees and hips. Learn the right way to lift weights so you do not make joint pain worse.   Take your medicines exactly as prescribed. Call your doctor if you think you are having a problem with your medicine.   Take pain medicines exactly as directed.   If the doctor gave you a prescription medicine for pain, take it as prescribed.   If you are not taking a prescription pain medicine, ask your doctor if you can take an over-the-counter medicine.   Use a cane, crutch, walker, or another device if you need help to get around. These can help rest your joints. You also can use other things to make  life easier, such as a higher toilet seat and padded handles on kitchen utensils.   Do not sit in low chairs, which can make it hard to get up.   Put heat or cold on your sore joints as needed. Use whichever helps you most. You also can take turns with hot and cold packs.   Apply heat 2 or 3 times a day for 20 to 30 minutes-using a heating pad, hot shower, or hot pack-to relieve pain and stiffness.   Put ice or a cold pack on your sore joint for 10 to 20 minutes at a time. Put a thin cloth between the ice and your skin.  When should you call for help?  Call your doctor now or seek immediate medical care if:    You have sudden swelling, warmth, or pain in any joint.     You have joint pain and a fever or rash.     You have such bad pain that you cannot use a joint.   Watch closely for changes in your health, and be sure to contact your doctor if:    You have mild joint symptoms that continue even with more than 6 weeks of care at home.     You have stomach pain or other problems with your medicine.   Where can you learn more?  Go to [email or web address] and  sign in to your MyChart account. Enter [phone] in the [place] box to learn more about "Arthritis: Care Instructions."     If you do not have an account, please click on the "Sign Up Now" link.  Current as of: April 29, 2016  Content Version: 11.7   2006-2018 [employer]. Care instructions adapted under license by [facility]. If you have questions about a medical condition or this instruction, always ask your healthcare professional. [place] any warranty or liability for your use of this information.

## 2017-02-25 NOTE — Progress Notes (Signed)
SUBJECTIVE  Jane Romero is a 56 y.o. female.    HPI/Chief C/O:  Chief Complaint   Patient presents with   . Medication Refill     6 month f/u     Allergies   Allergen Reactions   . Codeine Itching   She is here for a med list review and refills      Knee Pain    The incident occurred more than 1 week ago. The pain is present in the left knee and right knee. The quality of the pain is described as burning and aching. The pain is at a severity of 8/10. The pain is moderate. The pain has been worsening since onset. Pertinent negatives include no inability to bear weight, loss of motion, loss of sensation, muscle weakness, numbness or tingling.         ROS:  Review of Systems   Constitutional: Negative.  Negative for chills, diaphoresis, fever, malaise/fatigue and weight loss.   HENT: Negative for congestion, ear discharge, ear pain, hearing loss, nosebleeds, sinus pain, sore throat and tinnitus.    Eyes: Negative for blurred vision, double vision, photophobia, pain, discharge and redness.   Respiratory: Negative for cough, hemoptysis, sputum production, shortness of breath, wheezing and stridor.    Cardiovascular: Negative for chest pain, palpitations, orthopnea, claudication, leg swelling and PND.   Gastrointestinal: Negative for abdominal pain, blood in stool, constipation, diarrhea, heartburn, melena, nausea and vomiting.   Genitourinary: Negative.    Musculoskeletal: Positive for joint pain. Negative for back pain, falls, myalgias and neck pain.   Skin: Negative.  Negative for itching and rash.   Neurological: Negative for dizziness, tingling, tremors, sensory change, speech change, focal weakness, seizures, loss of consciousness, weakness, numbness and headaches.   Endo/Heme/Allergies: Negative for environmental allergies and polydipsia. Does not bruise/bleed easily.   Psychiatric/Behavioral: Negative.        Past Medical/Surgical Hx;  Reviewed with patient  History reviewed. No pertinent past medical  history.  History reviewed. No pertinent surgical history.    Past Family Hx:  Reviewed with patient  History reviewed. No pertinent family history.    Social Hx:  Reviewed with patient  Social History   Substance Use Topics   . Smoking status: Current Every Day Smoker     Packs/day: 0.25     Years: 15.00     Types: Cigarettes   . Smokeless tobacco: Never Used   . Alcohol use No       OBJECTIVE  BP 110/68   Pulse 68   Ht 5\' 6"  (1.676 m)   Wt 110 lb (49.9 kg)   SpO2 97%   BMI 17.75 kg/m     Problem List:  Kyia  does not have any pertinent problems on file.    PHYS EX:  Physical Exam   Constitutional: She is oriented to person, place, and time. She appears well-developed and well-nourished. No distress.   HENT:   Head: Normocephalic and atraumatic.   Right Ear: External ear normal.   Left Ear: External ear normal.   Nose: Nose normal.   Mouth/Throat: Oropharynx is clear and moist. No oropharyngeal exudate.   Eyes: Conjunctivae and EOM are normal. Pupils are equal, round, and reactive to light. Lids are everted and swept, no foreign bodies found. Right eye exhibits hordeolum. Right eye exhibits no chemosis, no discharge and no exudate. No foreign body present in the right eye. Left eye exhibits no chemosis, no discharge, no exudate and no hordeolum. No  foreign body present in the left eye. Right conjunctiva is not injected. Right conjunctiva has no hemorrhage. Left conjunctiva is not injected. Left conjunctiva has no hemorrhage. No scleral icterus. Right eye exhibits normal extraocular motion and no nystagmus. Left eye exhibits normal extraocular motion and no nystagmus. Right pupil is round and reactive. Left pupil is round and reactive. Pupils are equal.   Neck: Normal range of motion. Neck supple. No JVD present. No tracheal deviation present. No thyromegaly present.   Cardiovascular: Normal rate, regular rhythm and normal heart sounds.  Exam reveals no gallop and no friction rub.    No murmur  heard.  Pulmonary/Chest: Effort normal and breath sounds normal. No stridor. No respiratory distress. She has no wheezes. She has no rales. She exhibits no tenderness.   Abdominal: Soft. Bowel sounds are normal. She exhibits no distension and no mass. There is no tenderness. There is no rebound and no guarding. No hernia.   Musculoskeletal: Normal range of motion. She exhibits tenderness (bilateral knee pain). She exhibits no edema or deformity.   Lymphadenopathy:     She has no cervical adenopathy.   Neurological: She is alert and oriented to person, place, and time. She has normal reflexes. She displays normal reflexes. No cranial nerve deficit or sensory deficit. She exhibits normal muscle tone. Coordination normal.   Skin: Skin is warm. No rash noted. She is not diaphoretic. No erythema. No pallor.   Psychiatric:   Mood is stable   Nursing note and vitals reviewed.    ASSESSMENT/PLAN  Jane Romero was seen today for medication refill.    Diagnoses and all orders for this visit:    Primary osteoarthritis involving multiple joints  -     ibuprofen (ADVIL;MOTRIN) 800 MG tablet; Take 1 tablet by mouth every 8 hours as needed for Pain  --Omt/ultra--Rx    Depression, unspecified depression type  -     buPROPion (WELLBUTRIN XL) 300 MG extended release tablet; TAKE 1 TABLET BY MOUTH EVERY MORNING    Herpes genitalis in women  -     famciclovir (FAMVIR) 500 MG tablet; Take 1 tablet by mouth daily    Tobacco abuse  --still smoking         Outpatient Encounter Prescriptions as of 02/25/2017   Medication Sig Dispense Refill   . buPROPion (WELLBUTRIN XL) 300 MG extended release tablet TAKE 1 TABLET BY MOUTH EVERY MORNING 90 tablet 1   . ibuprofen (ADVIL;MOTRIN) 800 MG tablet Take 1 tablet by mouth every 8 hours as needed for Pain 120 tablet 5   . famciclovir (FAMVIR) 500 MG tablet Take 1 tablet by mouth daily 90 tablet 1   . [DISCONTINUED] buPROPion (WELLBUTRIN XL) 300 MG extended release tablet TAKE 1 TABLET BY MOUTH EVERY MORNING 30  tablet 0   . [DISCONTINUED] gabapentin (NEURONTIN) 300 MG capsule Take 300 mg by mouth nightly.     . [DISCONTINUED] ibuprofen (ADVIL;MOTRIN) 800 MG tablet Take 1 tablet by mouth every 8 hours as needed for Pain 120 tablet 5   . [DISCONTINUED] famciclovir (FAMVIR) 500 MG tablet Take 1 tablet by mouth daily 90 tablet 1   . nicotine (NICODERM CQ) 21 MG/24HR Place 1 patch onto the skin every 24 hours 30 patch 1     No facility-administered encounter medications on file as of 02/25/2017.        Return in about 6 months (around 08/28/2017).        [facility]  Discussed importance of regular Health Maintenance follow up  Health Maintenance   Topic   . HIV screen    . Pneumococcal med risk (1 of 1 - PPSV23)   . Shingles Vaccine (1 of 2 - 2 Dose Series)   . DTaP/Tdap/Td vaccine (1 - Tdap)   . Flu vaccine (1)   . Colon Cancer Screen FIT/FOBT    . Breast cancer screen    . Cervical cancer screen    . Lipid screen    . Hepatitis C screen

## 2017-03-12 NOTE — Telephone Encounter (Signed)
Pt left message for [facility] requesting z-pak for sinus infection. Please advise.    Electronically signed by Laurell Josephs, MA on 03/12/17 at 3:07 PM

## 2017-07-22 ENCOUNTER — Other Ambulatory Visit: Payer: Self-pay | Admitting: Family Medicine

## 2017-07-22 DIAGNOSIS — Z85038 Personal history of other malignant neoplasm of large intestine: Secondary | ICD-10-CM

## 2017-07-22 NOTE — Progress Notes (Signed)
Call pt.  nonfasting lab visit needed for routine CEA check.  Ordered.  Thanks.

## 2017-07-22 NOTE — Progress Notes (Signed)
Spoke to patient by telephone and lab appointment scheduled as instructed.

## 2017-07-24 ENCOUNTER — Other Ambulatory Visit: Payer: Self-pay

## 2017-07-24 MED ORDER — ESTROGENS, CONJUGATED 0.625 MG/GM VA CREA: TOPICAL_CREAM | VAGINAL | 0 refills | Status: DC

## 2017-07-30 ENCOUNTER — Other Ambulatory Visit (INDEPENDENT_AMBULATORY_CARE_PROVIDER_SITE_OTHER): Payer: BLUE CROSS/BLUE SHIELD

## 2017-07-30 DIAGNOSIS — Z85038 Personal history of other malignant neoplasm of large intestine: Secondary | ICD-10-CM | POA: Diagnosis not present

## 2017-07-31 LAB — CEA: CEA: 0.6 ng/mL

## 2017-08-27 ENCOUNTER — Encounter

## 2017-08-27 MED ORDER — FAMCICLOVIR 500 MG PO TABS
500 MG | ORAL_TABLET | ORAL | 0 refills | Status: DC
Start: 2017-08-27 — End: 2018-04-19

## 2017-09-03 ENCOUNTER — Encounter

## 2017-09-03 MED ORDER — BUPROPION HCL ER (XL) 300 MG PO TB24
300 MG | ORAL_TABLET | ORAL | 0 refills | Status: DC
Start: 2017-09-03 — End: 2017-11-25

## 2017-09-24 ENCOUNTER — Encounter

## 2017-11-25 ENCOUNTER — Encounter

## 2017-11-25 MED ORDER — BUPROPION HCL ER (XL) 300 MG PO TB24
300 MG | ORAL_TABLET | ORAL | 0 refills | Status: DC
Start: 2017-11-25 — End: 2018-04-19

## 2017-12-20 ENCOUNTER — Telehealth: Payer: Self-pay | Admitting: Family Medicine

## 2017-12-20 DIAGNOSIS — E785 Hyperlipidemia, unspecified: Secondary | ICD-10-CM

## 2017-12-20 DIAGNOSIS — Z85038 Personal history of other malignant neoplasm of large intestine: Secondary | ICD-10-CM

## 2017-12-20 NOTE — Telephone Encounter (Signed)
Call pt.  Due for yearly f/u with labs. Orders in EMR.  Thanks.

## 2017-12-21 ENCOUNTER — Encounter: Payer: Self-pay | Admitting: *Deleted

## 2017-12-21 NOTE — Telephone Encounter (Signed)
Letter mailed

## 2018-01-11 ENCOUNTER — Other Ambulatory Visit: Payer: Self-pay | Admitting: Family Medicine

## 2018-01-12 ENCOUNTER — Other Ambulatory Visit: Payer: Self-pay | Admitting: Family Medicine

## 2018-02-10 ENCOUNTER — Other Ambulatory Visit (INDEPENDENT_AMBULATORY_CARE_PROVIDER_SITE_OTHER): Payer: BLUE CROSS/BLUE SHIELD

## 2018-02-10 DIAGNOSIS — Z85038 Personal history of other malignant neoplasm of large intestine: Secondary | ICD-10-CM | POA: Diagnosis not present

## 2018-02-10 DIAGNOSIS — E785 Hyperlipidemia, unspecified: Secondary | ICD-10-CM | POA: Diagnosis not present

## 2018-02-10 LAB — COMPREHENSIVE METABOLIC PANEL
ALK PHOS: 63 U/L (ref 39–117)
ALT: 22 U/L (ref 0–35)
AST: 18 U/L (ref 0–37)
Albumin: 4.3 g/dL (ref 3.5–5.2)
BUN: 12 mg/dL (ref 6–23)
CO2: 30 mEq/L (ref 19–32)
Calcium: 9.3 mg/dL (ref 8.4–10.5)
Chloride: 102 mEq/L (ref 96–112)
Creatinine, Ser: 0.8 mg/dL (ref 0.40–1.20)
GFR: 78.09 mL/min (ref >60.00)
Glucose, Bld: 99 mg/dL (ref 70–99)
Potassium: 3.8 mEq/L (ref 3.5–5.1)
Sodium: 139 mEq/L (ref 135–145)
Total Bilirubin: 0.4 mg/dL (ref 0.2–1.2)
Total Protein: 7.6 g/dL (ref 6.0–8.3)

## 2018-02-10 LAB — LIPID PANEL
CHOL/HDL RATIO: 3
Cholesterol: 195 mg/dL (ref 0–200)
HDL: 57.1 mg/dL (ref >39.00)
LDL Cholesterol: 110 mg/dL — ABNORMAL HIGH (ref 0–99)
NONHDL: 138.19
Triglycerides: 141 mg/dL (ref 0.0–149.0)
VLDL: 28.2 mg/dL (ref 0.0–40.0)

## 2018-02-12 LAB — CEA: CEA: 0.6 ng/mL

## 2018-02-15 ENCOUNTER — Ambulatory Visit (INDEPENDENT_AMBULATORY_CARE_PROVIDER_SITE_OTHER): Payer: BLUE CROSS/BLUE SHIELD | Admitting: Family Medicine

## 2018-02-15 ENCOUNTER — Encounter: Payer: Self-pay | Admitting: Family Medicine

## 2018-02-15 VITALS — BP 126/70 | HR 75 | Temp 98.5°F | Ht 61.5 in | Wt 144.8 lb

## 2018-02-15 DIAGNOSIS — E785 Hyperlipidemia, unspecified: Secondary | ICD-10-CM

## 2018-02-15 DIAGNOSIS — Z0000 Encounter for general adult medical examination without abnormal findings: Secondary | ICD-10-CM

## 2018-02-15 DIAGNOSIS — Z7189 Other specified counseling: Secondary | ICD-10-CM

## 2018-02-15 DIAGNOSIS — F32A Depression, unspecified: Secondary | ICD-10-CM

## 2018-02-15 DIAGNOSIS — F329 Major depressive disorder, single episode, unspecified: Secondary | ICD-10-CM

## 2018-02-15 DIAGNOSIS — Z85038 Personal history of other malignant neoplasm of large intestine: Secondary | ICD-10-CM

## 2018-02-15 MED ORDER — DULOXETINE HCL 30 MG PO CPEP: ORAL_CAPSULE | ORAL | 3 refills | Status: DC

## 2018-02-15 MED ORDER — DULOXETINE HCL 60 MG PO CPEP: ORAL_CAPSULE | ORAL | 3 refills | Status: DC

## 2018-02-15 NOTE — Progress Notes (Signed)
CPE- See plan.  Routine anticipatory guidance given to patient.  See health maintenance.  The possibility exists that previously documented standard health maintenance information may have been brought forward from a previous encounter into this note.  If needed, that same information has been updated to reflect the current situation based on today's encounter.    Routine anticipatory guidance given to patient. See health maintenance.  Tetanus 2015  Flu shot encouraged.  PNA and shingles shot not due.  Mammogram done in 2018, she'll schedule f/u.   Defer pap to gyn.  D/w pt.  She agrees.   DXA not due. D/w pt.  Diet and exercise d/w pt. Encouraged both. Diet is variable.  Colonoscopy 2018.  Living will d/w pt. Husband designated if patient were incapacitated.  HCV prev neg.    No joint pain.  D/w pt.    H/o colon cancer, s/p resection.  CEA normal, d/w pt.  No blood in stool.  No FCNAVD.  No abd pain.  No abdominal symptoms. Other labs d/w pt.    HLD, d/w pt.  Likely not at the point of needing a statin, d/w pt.  Diet and exercise d/w pt, encouraged.  Lipids are some better from prev.    Mood d/w pt.  From a year ago to today, she still has some days with lack of motivation.  She has some anxiety.  She is trying to work through the [place].  About the same as last year, not worse.  No Si/Hi.  Still on cymbalta, likely with some relief and she wanted to continue.  Her situation is manageable.  She'll update me as needed.    PMH and SH reviewed  Meds, vitals, and allergies reviewed.   ROS: Per HPI.  Unless specifically indicated otherwise in HPI, the patient denies:  General: fever. Eyes: acute vision changes ENT: sore throat Cardiovascular: chest pain Respiratory: SOB GI: vomiting GU: dysuria Musculoskeletal: acute back pain Derm: acute rash Neuro: acute motor dysfunction Psych: worsening mood Endocrine: polydipsia Heme: bleeding Allergy: hayfever  GEN: nad, alert and  oriented HEENT: mucous membranes moist NECK: supple w/o LA CV: rrr. PULM: ctab, no inc wob ABD: soft, +bs EXT: no edema SKIN: no acute rash

## 2018-02-15 NOTE — Assessment & Plan Note (Signed)
Routine anticipatory guidance given to patient. See health maintenance.  Tetanus 2015  Flu shot encouraged.  PNA and shingles shot not due.  Mammogram done in 2018, she'll schedule f/u.   Defer pap to gyn.  D/w pt.  She agrees.   DXA not due. D/w pt.  Diet and exercise d/w pt. Encouraged both. Diet is variable.  Colonoscopy 2018.  Living will d/w pt. Husband designated if patient were incapacitated.  HCV prev neg.

## 2018-02-15 NOTE — Patient Instructions (Addendum)
I am really glad your CEA is normal.    Call about a mammogram and gynecology appointment.  Please have them send a copy of the notes to me.   Take care.  Glad to see you.  I would get a flu shot each fall.   Recheck CEA in about 6 months and then labs ahead in about 1 year.  Update me as needed.

## 2018-02-18 NOTE — Assessment & Plan Note (Signed)
She has some anxiety too.  She is trying to work through the [place].  About the same as last year, not worse.  No Si/Hi.  Still on cymbalta, likely with some relief and she wanted to continue.  Her situation is manageable.  She'll update me as needed.

## 2018-02-18 NOTE — Assessment & Plan Note (Signed)
Living will d/w pt.  Husband designated if patient were incapacitated.  

## 2018-02-18 NOTE — Assessment & Plan Note (Signed)
d/w pt.  Likely not at the point of needing a statin, d/w pt.  Diet and exercise d/w pt, encouraged.  Lipids are some better from prev.

## 2018-02-18 NOTE — Assessment & Plan Note (Signed)
H/o colon cancer, s/p resection.  CEA normal, d/w pt.  No blood in stool.  No FCNAVD.  No abd pain.  No abdominal symptoms. Other labs d/w pt.

## 2018-02-21 ENCOUNTER — Encounter

## 2018-03-28 ENCOUNTER — Encounter

## 2018-04-06 ENCOUNTER — Telehealth: Payer: Self-pay | Admitting: Family Medicine

## 2018-04-06 NOTE — Telephone Encounter (Signed)
Pt dropped off form to be filled out for employment. She's requesting it to be mailed to employer. Placed in [place] tower.

## 2018-04-07 NOTE — Telephone Encounter (Signed)
I'll work on the hard copy.  Thanks.  

## 2018-04-07 NOTE — Telephone Encounter (Signed)
Form is on your desk.

## 2018-04-13 NOTE — Telephone Encounter (Signed)
Form completed. Spoke to patient and she requested that the form be placed in the envelope that she provided and mailed. Placed in the mail.

## 2018-04-19 ENCOUNTER — Ambulatory Visit
Admit: 2018-04-19 | Discharge: 2018-04-19 | Payer: BLUE CROSS/BLUE SHIELD | Attending: Family Medicine | Primary: Family Medicine

## 2018-04-19 DIAGNOSIS — R7301 Impaired fasting glucose: Secondary | ICD-10-CM

## 2018-04-19 MED ORDER — FAMCICLOVIR 500 MG PO TABS
500 MG | ORAL_TABLET | ORAL | 1 refills | Status: DC
Start: 2018-04-19 — End: 2018-11-17

## 2018-04-19 MED ORDER — IBUPROFEN 800 MG PO TABS
800 MG | ORAL_TABLET | Freq: Three times a day (TID) | ORAL | 1 refills | Status: DC | PRN
Start: 2018-04-19 — End: 2018-11-17

## 2018-04-19 MED ORDER — BUPROPION HCL ER (XL) 300 MG PO TB24
300 MG | ORAL_TABLET | ORAL | 1 refills | Status: DC
Start: 2018-04-19 — End: 2018-11-17

## 2018-04-19 NOTE — Patient Instructions (Addendum)
Patient Education        Recovering From Depression: Care Instructions  Your Care Instructions    Taking good care of yourself is important as you recover from depression. In time, your symptoms will fade as your treatment takes hold. Do not give up. Instead, focus your energy on getting better.  Your mood will improve. It just takes some time. Focus on things that can help you feel better, such as being with friends and family, eating well, and getting enough rest. But take things slowly. Do not do too much too soon. You will begin to feel better gradually.  Follow-up care is a key part of your treatment and safety. Be sure to make and go to all appointments, and call your doctor if you are having problems. It's also a good idea to know your test results and keep a list of the medicines you take.  How can you care for yourself at home?  Be realistic  ?? If you have a large task to do, break it up into smaller steps you can handle, and just do what you can.  ?? You may want to put off important decisions until your depression has lifted. If you have plans that will have a major impact on your life, such as marriage, divorce, or a job change, try to wait a bit. Talk it over with friends and loved ones who can help you look at the overall picture first.  ?? Reaching out to people for help is important. Do not isolate yourself. Let your family and friends help you. Find someone you can trust and confide in, and talk to that person.  ?? Be patient, and be kind to yourself. Remember that depression is not your fault and is not something you can overcome with willpower alone. Treatment is necessary for depression, just like for any other illness. Feeling better takes time, and your mood will improve little by little.  Stay active  ?? Stay busy and get outside. Take a walk, or try some other light exercise.  ?? Talk with your doctor about an exercise program. Exercise can help with mild depression.  ?? Go to a movie or concert.  Take part in a church activity or other social gathering. Go to a ball game.  ?? Ask a friend to have dinner with you.  Take care of yourself  ?? Eat a balanced diet with plenty of fresh fruits and vegetables, whole grains, and lean protein. If you have lost your appetite, eat small snacks rather than large meals.  ?? Avoid drinking alcohol or using illegal drugs. Do not take medicines that have not been prescribed for you. They may interfere with medicines you may be taking for depression, or they may make your depression worse.  ?? Take your medicines exactly as they are prescribed. You may start to feel better within 1 to 3 weeks of taking antidepressant medicine. But it can take as many as 6 to 8 weeks to see more improvement. If you have questions or concerns about your medicines, or if you do not notice any improvement by 3 weeks, talk to your doctor.  ?? If you have any side effects from your medicine, tell your doctor. Antidepressants can make you feel tired, dizzy, or nervous. Some people have dry mouth, constipation, headaches, sexual problems, or diarrhea. Many of these side effects are mild and will go away on their own after you have been taking the medicine for a few weeks. Some   may last longer. Talk to your doctor if side effects are bothering you too much. You might be able to try a different medicine.  ?? Get enough sleep. If you have problems sleeping:  ? Go to bed at the same time every night, and get up at the same time every morning.  ? Keep your bedroom dark and quiet.  ? Do not exercise after 5:00 p.m.  ? Avoid drinks with caffeine after 5:00 p.m.  ?? Avoid sleeping pills unless they are prescribed by the doctor treating your depression. Sleeping pills may make you groggy during the day, and they may interact with other medicine you are taking.  ?? If you have any other illnesses, such as diabetes, heart disease, or high blood pressure, make sure to continue with your treatment. Tell your doctor about  all of the medicines you take, including those with or without a prescription.  ?? Keep the numbers for these national suicide hotlines: 1-800-273-TALK (1-800-273-8255) and 1-800-SUICIDE (1-800-784-2433). If you or someone you know talks about suicide or feeling hopeless, get help right away.  When should you call for help?  Call 911 anytime you think you may need emergency care. For example, call if:  ?? ?? You feel like hurting yourself or someone else.   ?? ?? Someone you know has depression and is about to attempt or is attempting suicide.   ??Call your doctor now or seek immediate medical care if:  ?? ?? You hear voices.   ?? ?? Someone you know has depression and:  ? Starts to give away his or her possessions.  ? Uses illegal drugs or drinks alcohol heavily.  ? Talks or writes about death, including writing suicide notes or talking about guns, knives, or pills.  ? Starts to spend a lot of time alone.  ? Acts very aggressively or suddenly appears calm.   ??Watch closely for changes in your health, and be sure to contact your doctor if:  ?? ?? You do not get better as expected.   Where can you learn more?  Go to [email or web address] and sign in to your MyChart account. Enter [id number] in the Search Health Information box to learn more about "Recovering From Depression: Care Instructions."     If you do not have an account, please click on the "Sign Up Now" link.  Current as of: March 31, 2017  Content Version: 12.1  ?? 2006-2019 [employer], [employer]. Care instructions adapted under license by [facility]. If you have questions about a medical condition or this instruction, always ask your healthcare professional. [employer], [employer] disclaims any warranty or liability for your use of this information.

## 2018-04-19 NOTE — Progress Notes (Signed)
Vaccine Information Sheet, "Influenza - Inactivated"  given to Mayer Masker, or parent/legal guardian of  Hitomi Slape and verbalized understanding.    Patient responses:    Have you ever had a reaction to a flu vaccine? No  Do you have any current illness?  No  Have you ever had Guillian Barre Syndrome?  No  Do you have a serious allergy to any of the follow: Neomycin, Polymyxin, Thimerosal, eggs or egg products? No    Flu vaccine given per order. Please see immunization tab.    Risks and benefits explained.  Current VIS given.       Electronically signed by Laurell Josephs, MA on 04/19/18 at 3:42 PM

## 2018-04-19 NOTE — Progress Notes (Signed)
SUBJECTIVE  Jane Romero is a 57 y.o. female.    HPI/Chief C/O:  Chief Complaint   Patient presents with   ??? Medication Refill     Pt here for check up and refills - agreeable to flu shot, pended     Allergies   Allergen Reactions   ??? Codeine Itching   The patient is here for a medication list and treatment planning review  We will go over our care planning goals as well as take care of all refills  We will set up labs as well     Hypertension   Associated symptoms include anxiety and malaise/fatigue. Pertinent negatives include no blurred vision, chest pain, headaches, neck pain, orthopnea, palpitations, peripheral edema, PND, shortness of breath or sweats. Risk factors for coronary artery disease include post-menopausal state and stress. Past treatments include lifestyle changes. The current treatment provides significant improvement. Compliance problems include psychosocial issues, exercise and diet.  There is no history of angina, kidney disease, CAD/MI, CVA, heart failure, left ventricular hypertrophy, PVD or retinopathy. There is no history of chronic renal disease, coarctation of the aorta, hyperaldosteronism, hypercortisolism, hyperparathyroidism, a hypertension causing med, pheochromocytoma, renovascular disease, sleep apnea or a thyroid problem.         ROS:  Review of Systems   Constitutional: Positive for fatigue and malaise/fatigue. Negative for activity change, appetite change, chills, diaphoresis, fever and unexpected weight change.   HENT: Negative.  Negative for congestion, dental problem, drooling, ear discharge, ear pain, facial swelling, hearing loss, mouth sores, nosebleeds, postnasal drip, rhinorrhea, sinus pressure, sinus pain, sneezing, sore throat, tinnitus, trouble swallowing and voice change.    Eyes: Negative for blurred vision, photophobia, pain, discharge, redness, itching and visual disturbance.   Respiratory: Negative.  Negative for apnea, cough, choking, chest tightness,  shortness of breath, wheezing and stridor.    Cardiovascular: Negative.  Negative for chest pain, palpitations, orthopnea, leg swelling and PND.   Gastrointestinal: Negative.  Negative for abdominal distention, abdominal pain, anal bleeding, blood in stool, constipation, diarrhea, nausea, rectal pain and vomiting.   Endocrine: Negative.  Negative for cold intolerance, heat intolerance, polydipsia, polyphagia and polyuria.   Genitourinary: Negative.  Negative for decreased urine volume, difficulty urinating, dyspareunia, dysuria, enuresis, flank pain, frequency, genital sores, hematuria and urgency.   Musculoskeletal: Negative.  Negative for arthralgias, back pain, gait problem, joint swelling, myalgias, neck pain and neck stiffness.   Skin: Negative.  Negative for color change, pallor, rash and wound.   Allergic/Immunologic: Negative.  Negative for environmental allergies, food allergies and immunocompromised state.   Neurological: Negative.  Negative for dizziness, tremors, seizures, syncope, facial asymmetry, speech difficulty, weakness, light-headedness, numbness and headaches.   Hematological: Negative.  Negative for adenopathy. Does not bruise/bleed easily.   Psychiatric/Behavioral: Negative.  Negative for agitation, behavioral problems, confusion, decreased concentration, dysphoric mood, hallucinations, self-injury, sleep disturbance and suicidal ideas. The patient is not nervous/anxious and is not hyperactive.         Past Medical/Surgical Hx;  Reviewed with patient  History reviewed. No pertinent past medical history.  History reviewed. No pertinent surgical history.    Past Family Hx:  Reviewed with patient  History reviewed. No pertinent family history.    Social Hx:  Reviewed with patient  Social History     Tobacco Use   ??? Smoking status: Current Every Day Smoker     Packs/day: 0.25     Years: 15.00     Pack years: 3.75  Types: Cigarettes   ??? Smokeless tobacco: Never Used   Substance Use Topics   ???  Alcohol use: No       OBJECTIVE  BP 114/72    Pulse 78    Temp 98.6 ??F (37 ??C) (Temporal)    Resp 18    Ht 5\' 6"  (1.676 m)    Wt 109 lb (49.4 kg)    LMP  (LMP Unknown)    SpO2 96%    Breastfeeding? No    BMI 17.59 kg/m??     Problem List:  Jane Romero does not have any pertinent problems on file.    PHYS EX:  Physical Exam   Constitutional: She is oriented to person, place, and time. She appears well-developed and well-nourished. No distress.   HENT:   Head: Normocephalic and atraumatic.   Right Ear: External ear normal.   Left Ear: External ear normal.   Nose: Nose normal.   Mouth/Throat: Oropharynx is clear and moist. No oropharyngeal exudate.   Eyes: Pupils are equal, round, and reactive to light. Conjunctivae and EOM are normal. Lids are everted and swept, no foreign bodies found. Right eye exhibits no chemosis, no discharge, no exudate and no hordeolum. No foreign body present in the right eye. Left eye exhibits no chemosis, no discharge, no exudate and no hordeolum. No foreign body present in the left eye. Right conjunctiva is not injected. Right conjunctiva has no hemorrhage. Left conjunctiva is not injected. Left conjunctiva has no hemorrhage. No scleral icterus. Right eye exhibits normal extraocular motion and no nystagmus. Left eye exhibits normal extraocular motion and no nystagmus. Right pupil is round and reactive. Left pupil is round and reactive. Pupils are equal.   Neck: Normal range of motion. Neck supple. No JVD present. No tracheal deviation present. No thyromegaly present.   Cardiovascular: Normal rate, regular rhythm and normal heart sounds. Exam reveals no gallop and no friction rub.   No murmur heard.  Pulmonary/Chest: Effort normal and breath sounds normal. No stridor. No respiratory distress. She has no wheezes. She has no rales. She exhibits no tenderness.   Abdominal: Soft. Bowel sounds are normal. She exhibits no distension and no mass. There is no tenderness. There is no rebound and no guarding.  No hernia.   Musculoskeletal: Normal range of motion. She exhibits tenderness (bilateral knee pain). She exhibits no edema or deformity.   Lymphadenopathy:     She has no cervical adenopathy.   Neurological: She is alert and oriented to person, place, and time. She has normal reflexes. She displays normal reflexes. No cranial nerve deficit or sensory deficit. She exhibits normal muscle tone. Coordination normal.   Skin: Skin is warm. No rash noted. She is not diaphoretic. No erythema. No pallor.   Psychiatric:   Mood is stable   Nursing note and vitals reviewed.      ASSESSMENT/PLAN  Lavanda was seen today for medication refill.    Diagnoses and all orders for this visit:    IFG (impaired fasting glucose)  -     Comprehensive Metabolic Panel; Future  -     CBC Auto Differential; Future  -     Hemoglobin A1C; Future  Long talk on treatment and prevention  Literature is given       Depression, unspecified depression type  -     buPROPion (WELLBUTRIN XL) 300 MG extended release tablet; TAKE 1 TABLET BY MOUTH EVERY DAY IN THE MORNING  -     Comprehensive Metabolic Panel;  Future  -     CBC Auto Differential; Future  Long talk on treatment and prevention  Literature is given   --stable on current care planning  -- continue treatment as we are meeting goals       Herpes genitalis in women  -     famciclovir (FAMVIR) 500 MG tablet; TAKE 1 TABLET BY MOUTH EVERY DAY  -     Comprehensive Metabolic Panel; Future  -     CBC Auto Differential; Future    Primary osteoarthritis involving multiple joints  -     ibuprofen (ADVIL;MOTRIN) 800 MG tablet; Take 1 tablet by mouth every 8 hours as needed for Pain  -     Comprehensive Metabolic Panel; Future  -     CBC Auto Differential; Future    Dyslipidemia  -     Comprehensive Metabolic Panel; Future  -     Lipid Panel; Future  -     CBC Auto Differential; Future    Fatigue, unspecified type  -     TSH without Reflex; Future  -     Comprehensive Metabolic Panel; Future  -     CBC Auto  Differential; Future    Immunization due  -     INFLUENZA, QUADV, 3 YRS AND OLDER, IM PF, PREFILL SYR OR SDV, 0.5ML (AFLURIA QUADV, PF)  -     Comprehensive Metabolic Panel; Future  -     CBC Auto Differential; Future    Screen for colon cancer  -     Cologuard (For External Results Only); Future  -     Comprehensive Metabolic Panel; Future  -     CBC Auto Differential; Future        Outpatient Encounter Medications as of 04/19/2018   Medication Sig Dispense Refill   ??? buPROPion (WELLBUTRIN XL) 300 MG extended release tablet TAKE 1 TABLET BY MOUTH EVERY DAY IN THE MORNING 90 tablet 1   ??? famciclovir (FAMVIR) 500 MG tablet TAKE 1 TABLET BY MOUTH EVERY DAY 90 tablet 1   ??? ibuprofen (ADVIL;MOTRIN) 800 MG tablet Take 1 tablet by mouth every 8 hours as needed for Pain 270 tablet 1   ??? [DISCONTINUED] buPROPion (WELLBUTRIN XL) 300 MG extended release tablet TAKE 1 TABLET BY MOUTH EVERY DAY IN THE MORNING 90 tablet 0   ??? [DISCONTINUED] famciclovir (FAMVIR) 500 MG tablet TAKE 1 TABLET BY MOUTH EVERY DAY 30 tablet 0   ??? [DISCONTINUED] ibuprofen (ADVIL;MOTRIN) 800 MG tablet Take 1 tablet by mouth every 8 hours as needed for Pain 120 tablet 5   ??? [DISCONTINUED] nicotine (NICODERM CQ) 21 MG/24HR Place 1 patch onto the skin every 24 hours 30 patch 1     No facility-administered encounter medications on file as of 04/19/2018.        Return in about 6 months (around 10/18/2018).        Reviewed recent labs related to [facility] current problems      Discussed importance of regular Health Maintenance follow up  Health Maintenance   Topic   ??? Pneumococcal 0-64 years Vaccine (1 of 1 - PPSV23)   ??? HIV screen    ??? Shingles Vaccine (1 of 2)   ??? DTaP/Tdap/Td vaccine (1 - Tdap)   ??? Colon Cancer Screen FIT/FOBT    ??? Breast cancer screen    ??? Cervical cancer screen    ??? Lipid screen    ??? Flu vaccine    ??? Hepatitis C  10:40 AM    .screen

## 2018-04-26 LAB — COMPREHENSIVE METABOLIC PANEL

## 2018-04-26 LAB — CBC WITH AUTO DIFFERENTIAL

## 2018-04-26 LAB — LIPID PANEL
Chol/HDL Ratio: 2.6
Cholesterol, Total: 195 mg/dL
HDL: 76 mg/dL — AB (ref 35–70)
LDL Calculated: 108 mg/dL (ref 0–160)
Triglycerides: 56 mg/dL

## 2018-04-26 LAB — HEMOGLOBIN A1C: Hemoglobin A1C: 5.9 %

## 2018-04-26 LAB — TSH

## 2018-05-05 ENCOUNTER — Encounter

## 2018-05-10 LAB — FECAL DNA COLORECTAL CANCER SCREENING (COLOGUARD): FIT-DNA (Cologuard): NEGATIVE

## 2018-05-12 ENCOUNTER — Encounter

## 2018-06-21 ENCOUNTER — Telehealth: Payer: Self-pay | Admitting: Family Medicine

## 2018-06-21 NOTE — Telephone Encounter (Signed)
I left a message on patient's voice mail to call back.  Patient needs to schedule a non fasting lab appointment.

## 2018-06-22 ENCOUNTER — Other Ambulatory Visit (INDEPENDENT_AMBULATORY_CARE_PROVIDER_SITE_OTHER): Payer: BLUE CROSS/BLUE SHIELD

## 2018-06-22 DIAGNOSIS — Z85038 Personal history of other malignant neoplasm of large intestine: Secondary | ICD-10-CM | POA: Diagnosis not present

## 2018-06-23 LAB — CEA: CEA: 0.6 ng/mL

## 2018-07-27 ENCOUNTER — Inpatient Hospital Stay: Payer: BLUE CROSS/BLUE SHIELD | Primary: Family Medicine

## 2018-07-27 DIAGNOSIS — Z124 Encounter for screening for malignant neoplasm of cervix: Secondary | ICD-10-CM

## 2018-07-30 LAB — HUMAN PAPILLOMAVIRUS (HPV) DNA PROBE THIN PREP HIGH RISK
HPV Genotype 16: NOT DETECTED
HPV Type 18: NOT DETECTED
HPV, High Risk Other: NOT DETECTED

## 2018-10-14 ENCOUNTER — Encounter

## 2018-11-05 ENCOUNTER — Encounter

## 2018-11-17 ENCOUNTER — Telehealth
Admit: 2018-11-17 | Discharge: 2018-11-17 | Payer: BLUE CROSS/BLUE SHIELD | Attending: Family Medicine | Primary: Family Medicine

## 2018-11-17 DIAGNOSIS — M159 Polyosteoarthritis, unspecified: Secondary | ICD-10-CM

## 2018-11-17 DIAGNOSIS — M8949 Other hypertrophic osteoarthropathy, multiple sites: Secondary | ICD-10-CM

## 2018-11-17 MED ORDER — IBUPROFEN 800 MG PO TABS
800 MG | ORAL_TABLET | Freq: Three times a day (TID) | ORAL | 1 refills | Status: DC | PRN
Start: 2018-11-17 — End: 2019-05-20

## 2018-11-17 MED ORDER — BUPROPION HCL ER (XL) 300 MG PO TB24
300 MG | ORAL_TABLET | ORAL | 1 refills | Status: DC
Start: 2018-11-17 — End: 2019-05-20

## 2018-11-17 MED ORDER — FAMCICLOVIR 500 MG PO TABS
500 MG | ORAL_TABLET | ORAL | 1 refills | Status: DC
Start: 2018-11-17 — End: 2019-05-17

## 2018-11-17 NOTE — Progress Notes (Signed)
SUBJECTIVE  Jane Romero is a 58 y.o. female.    HPI/Chief C/O:  Chief Complaint   Patient presents with   ??? Medication Refill     Pharmacy reviewed - meds pended   ??? Other     Pt verbally agreed to telemedicine visit, and is currently at home for the visit     Allergies   Allergen Reactions   ??? Codeine Itching   TeleMedicine Patient Consent    This visit was performed as a virtual video visit using a synchronous, two-way, audio-video telehealth technology platform. Patient identification was verified at the start of the visit, including the patient's telephone number and physical location. I discussed with the patient the nature of our telehealth visits, that:     Due to the nature of an audio- video modality, the only components of a physical exam that could be done are the elements supported by direct observation.  I would evaluate the patient and recommend diagnostics and treatments based on my assessment.  If it was felt that the patient should be evaluated in clinic or an emergency room setting, then they would be directed there.  Our sessions are not being recorded and that personal health information is protected.  Our team would provide follow up care in person if/when the patient needs it.       Patient does agree to proceed with telemedicine consultation.    Patient's location: home address in [place]  Physician  location home address in [place] other people involved in call, are none        Time spent: Greater than 30    This visit was completed virtually using [email or web address]  The patient is here for a medication list and treatment planning review  We will go over our care planning goals as well as take care of all refills  We will set up labs as well             Hypertension   Associated symptoms include anxiety and malaise/fatigue. Pertinent negatives include no blurred vision, chest pain, headaches, neck pain, orthopnea, palpitations, peripheral edema, PND, shortness of breath or sweats. Risk factors for  coronary artery disease include post-menopausal state and stress. Past treatments include lifestyle changes. The current treatment provides significant improvement. Compliance problems include psychosocial issues, exercise and diet.  There is no history of angina, kidney disease, CAD/MI, CVA, heart failure, left ventricular hypertrophy, PVD or retinopathy. There is no history of chronic renal disease, coarctation of the aorta, hyperaldosteronism, hypercortisolism, hyperparathyroidism, a hypertension causing med, pheochromocytoma, renovascular disease, sleep apnea or a thyroid problem.         ROS:  Review of Systems   Constitutional: Positive for malaise/fatigue. Negative for activity change, appetite change and unexpected weight change.   HENT: Negative.  Negative for dental problem, drooling, ear discharge, ear pain, facial swelling, hearing loss, mouth sores, nosebleeds, postnasal drip, rhinorrhea, sinus pressure, sinus pain, sneezing, tinnitus, trouble swallowing and voice change.    Eyes: Negative for blurred vision, photophobia, pain, discharge, redness, itching and visual disturbance.   Respiratory: Negative.  Negative for apnea, choking, chest tightness, shortness of breath, wheezing and stridor.    Cardiovascular: Negative.  Negative for chest pain, palpitations, orthopnea, leg swelling and PND.   Gastrointestinal: Negative.  Negative for abdominal distention, anal bleeding, blood in stool, constipation, diarrhea and rectal pain.   Endocrine: Negative.  Negative for cold intolerance, heat intolerance, polydipsia, polyphagia and polyuria.   Genitourinary: Negative.  Negative for decreased  urine volume, difficulty urinating, dyspareunia, dysuria, enuresis, flank pain, frequency, genital sores, hematuria and urgency.   Musculoskeletal: Negative.  Negative for back pain, gait problem, neck pain and neck stiffness.   Skin: Negative.  Negative for color change, pallor and wound.   Allergic/Immunologic: Negative.   Negative for environmental allergies, food allergies and immunocompromised state.   Neurological: Negative.  Negative for dizziness, tremors, seizures, syncope, facial asymmetry, speech difficulty, light-headedness and headaches.   Hematological: Negative.  Negative for adenopathy. Does not bruise/bleed easily.   Psychiatric/Behavioral: Negative.  Negative for agitation, behavioral problems, confusion, decreased concentration, dysphoric mood, hallucinations, self-injury, sleep disturbance and suicidal ideas. The patient is not nervous/anxious and is not hyperactive.         Past Medical/Surgical Hx;  Reviewed with patient  No past medical history on file.  No past surgical history on file.    Past Family Hx:  Reviewed with patient  No family history on file.    Social Hx:  Reviewed with patient  Social History     Tobacco Use   ??? Smoking status: Current Every Day Smoker     Packs/day: 0.25     Years: 15.00     Pack years: 3.75     Types: Cigarettes   ??? Smokeless tobacco: Never Used   Substance Use Topics   ??? Alcohol use: No       OBJECTIVE  There were no vitals taken for this visit.    Problem List:  Jane Romero does not have any pertinent problems on file.    PHYS EX:  General: Awake/Alert/Oriented/No Acute Distress      ASSESSMENT/PLAN  Jane Romero was seen today for medication refill and other.    Diagnoses and all orders for this visit:    Primary osteoarthritis involving multiple joints  -     ibuprofen (ADVIL;MOTRIN) 800 MG tablet; Take 1 tablet by mouth every 8 hours as needed for Pain  -     Comprehensive Metabolic Panel; Future  -     CBC Auto Differential; Future    Depression, unspecified depression type  -     buPROPion (WELLBUTRIN XL) 300 MG extended release tablet; TAKE 1 TABLET BY MOUTH EVERY DAY IN THE MORNING  -     Comprehensive Metabolic Panel; Future  -     CBC Auto Differential; Future    Herpes genitalis in women  -     famciclovir (FAMVIR) 500 MG tablet; TAKE 1 TABLET BY MOUTH EVERY DAY  -     Comprehensive  Metabolic Panel; Future  -     CBC Auto Differential; Future    IFG (impaired fasting glucose)  -     Comprehensive Metabolic Panel; Future  -     CBC Auto Differential; Future  -     Hemoglobin A1C; Future    Dyslipidemia  -     Comprehensive Metabolic Panel; Future  -     Lipid Panel; Future  -     CBC Auto Differential; Future    Fatigue, unspecified type  -     TSH without Reflex; Future  -     Comprehensive Metabolic Panel; Future  -     CBC Auto Differential; Future        Outpatient Encounter Medications as of 11/17/2018   Medication Sig Dispense Refill   ??? buPROPion (WELLBUTRIN XL) 300 MG extended release tablet TAKE 1 TABLET BY MOUTH EVERY DAY IN THE MORNING 90 tablet 1   ???  ibuprofen (ADVIL;MOTRIN) 800 MG tablet Take 1 tablet by mouth every 8 hours as needed for Pain 270 tablet 1   ??? famciclovir (FAMVIR) 500 MG tablet TAKE 1 TABLET BY MOUTH EVERY DAY 90 tablet 1   ??? [DISCONTINUED] buPROPion (WELLBUTRIN XL) 300 MG extended release tablet TAKE 1 TABLET BY MOUTH EVERY DAY IN THE MORNING 90 tablet 1   ??? [DISCONTINUED] famciclovir (FAMVIR) 500 MG tablet TAKE 1 TABLET BY MOUTH EVERY DAY 90 tablet 1   ??? [DISCONTINUED] ibuprofen (ADVIL;MOTRIN) 800 MG tablet Take 1 tablet by mouth every 8 hours as needed for Pain 270 tablet 1     No facility-administered encounter medications on file as of 11/17/2018.        No follow-ups on file.        Reviewed recent labs related to [facility] current problems      Discussed importance of regular Health Maintenance follow up  Health Maintenance   Topic   ??? Pneumococcal 0-64 years Vaccine (1 of 1 - PPSV23)   ??? HIV screen    ??? DTaP/Tdap/Td vaccine (1 - Tdap)   ??? Shingles Vaccine (1 of 2)   ??? A1C test (Diabetic or Prediabetic)    ??? Breast cancer screen    ??? Colon cancer screen fecal DNA test (Cologuard)    ??? Lipid screen    ??? Cervical cancer screen    ??? Flu vaccine    ??? Hepatitis C screen    ??? Hepatitis A vaccine    ??? Hepatitis B vaccine    ??? Hib vaccine    ??? Meningococcal (ACWY) vaccine

## 2018-12-20 ENCOUNTER — Other Ambulatory Visit: Payer: Self-pay | Admitting: Family Medicine

## 2018-12-20 DIAGNOSIS — E785 Hyperlipidemia, unspecified: Secondary | ICD-10-CM

## 2018-12-20 DIAGNOSIS — Z85038 Personal history of other malignant neoplasm of large intestine: Secondary | ICD-10-CM

## 2019-01-11 ENCOUNTER — Encounter: Payer: Self-pay | Admitting: Family Medicine

## 2019-01-11 ENCOUNTER — Telehealth: Payer: Self-pay

## 2019-01-11 ENCOUNTER — Other Ambulatory Visit: Payer: Self-pay

## 2019-01-11 ENCOUNTER — Ambulatory Visit: Payer: BC Managed Care – PPO | Admitting: Family Medicine

## 2019-01-11 VITALS — BP 134/62 | HR 91 | Temp 97.2°F | Resp 18 | Ht 61.5 in | Wt 154.0 lb

## 2019-01-11 DIAGNOSIS — R0789 Other chest pain: Secondary | ICD-10-CM | POA: Diagnosis not present

## 2019-01-11 DIAGNOSIS — Z85038 Personal history of other malignant neoplasm of large intestine: Secondary | ICD-10-CM | POA: Diagnosis not present

## 2019-01-11 DIAGNOSIS — E785 Hyperlipidemia, unspecified: Secondary | ICD-10-CM | POA: Diagnosis not present

## 2019-01-11 NOTE — Assessment & Plan Note (Signed)
Annual labs due.

## 2019-01-11 NOTE — Progress Notes (Signed)
Subjective:     Jacqueline Garza is a 58 y.o. female presenting for Chest Pain (happened at 4 am this morning, mid chest pain and radiated to the right side of the neck and back. No symptoms at this time)     Chest Pain  This is a new problem. The current episode started today. The onset quality is sudden. The problem occurs rarely (one other episode last month). The problem has been resolved. The pain is present in the substernal region. The pain is at a severity of 4/10. The quality of the pain is described as sharp (woke her from sleep). The pain radiates to the right neck and mid back. Associated symptoms include back pain. Pertinent negatives include no abdominal pain, cough, diaphoresis, dizziness, fever, headaches, irregular heartbeat, nausea, near-syncope, numbness, palpitations, shortness of breath, sputum production, vomiting or weakness. The pain is aggravated by nothing. She has tried rest for the symptoms. There are no known risk factors.  Her family medical history is significant for CAD and heart disease.     Review of Systems  Constitutional: Negative for diaphoresis and fever.  Respiratory: Negative for cough, sputum production and shortness of breath.   Cardiovascular: Positive for chest pain. Negative for palpitations and near-syncope.  Gastrointestinal: Negative for abdominal pain, nausea and vomiting.  Musculoskeletal: Positive for back pain.  Neurological: Negative for dizziness, weakness, numbness and headaches.     Social History   Tobacco Use  Smoking Status Never Smoker  Smokeless Tobacco Never Used        Objective:    BP Readings from Last 3 Encounters:  01/11/19 134/62  02/15/18 126/70  01/23/17 128/82   Wt Readings from Last 3 Encounters:  01/11/19 154 lb (69.9 kg)  02/15/18 144 lb 12 oz (65.7 kg)  01/23/17 144 lb (65.3 kg)    BP 134/62   Pulse 91   Temp (!) 97.2 F (36.2 C)   Resp 18   Ht 5' 1.5" (1.562 m)   Wt 154 lb (69.9 kg)    LMP 08/19/2010   SpO2 98%   BMI 28.63 kg/m    Physical Exam Constitutional:      General: She is not in acute distress.    Appearance: She is well-developed. She is not diaphoretic.  HENT:     Head: Normocephalic and atraumatic.     Right Ear: External ear normal.     Left Ear: External ear normal.     Nose: Nose normal.  Eyes:     Conjunctiva/sclera: Conjunctivae normal.  Neck:     Musculoskeletal: Neck supple.  Cardiovascular:     Rate and Rhythm: Normal rate and regular rhythm.     Heart sounds: Normal heart sounds.  Pulmonary:     Effort: Pulmonary effort is normal. No tachypnea.     Breath sounds: Normal breath sounds.  Chest:     Chest wall: Tenderness (over the left sternal boarder) present. No mass.  Abdominal:     General: Bowel sounds are normal.     Palpations: Abdomen is soft. There is no hepatomegaly or mass.     Tenderness: There is no abdominal tenderness. There is no guarding.  Skin:    General: Skin is warm and dry.     Capillary Refill: Capillary refill takes less than 2 seconds.  Neurological:     General: No focal deficit present.     Mental Status: She is alert. Mental status is at baseline.  Psychiatric:  Mood and Affect: Mood normal.        Behavior: Behavior normal.     EKG: sinus, normal rate, no ST changes.       Assessment & Plan:   Problem List Items Addressed This Visit      Other   History of colon cancer    Getting CEA as almost due for follow-up and getting labs today      Relevant Orders   CBC with Differential/Platelet   Comprehensive metabolic panel   Lipid panel   HLD (hyperlipidemia)    Annual labs due.       Relevant Orders   CBC with Differential/Platelet   Comprehensive metabolic panel   Lipid panel   Other chest pain - Primary    Atypical pattern, but given age and family history concerning for possible cardiac risk. Referral to cardiology.       Relevant Orders   EKG 12-Lead (Completed)    Ambulatory referral to Cardiology   CBC with Differential/Platelet   Comprehensive metabolic panel   Lipid panel       Return if symptoms worsen or fail to improve.  Lesleigh Noe, MD

## 2019-01-11 NOTE — Telephone Encounter (Signed)
Pt left v/m; this morning at 4AM pt was awakened by mid chest pain,pain in throat and back.lasted 3-4'. this is second time this has occurred; occurred last month with same time frame and symptoms.pt has hx of heart problems in pts family, grandmother;last annual 02/15/18. No pain now.no N&V,no SOB. No covid symptoms; did have cough last wk for few hours; ? Allergy. No travel and no known exposure to + covid. No weakness. Pt said she feels fine now. Dr Damita Dunnings had no available appts (reached limit per session). Pt scheduled in office appt today at 12 noon  With Dr Einar Pheasant. ED precautions given.

## 2019-01-11 NOTE — Assessment & Plan Note (Signed)
Atypical pattern, but given age and family history concerning for possible cardiac risk. Referral to cardiology.

## 2019-01-11 NOTE — Assessment & Plan Note (Signed)
Getting CEA as almost due for follow-up and getting labs today

## 2019-01-11 NOTE — Patient Instructions (Signed)
Chest pain - labs today - cardiology referral - call back if happening more often or if lasting longer  For reflux - can try omeprazole to see if that helps

## 2019-01-12 ENCOUNTER — Encounter: Payer: Self-pay | Admitting: Family Medicine

## 2019-01-12 LAB — COMPREHENSIVE METABOLIC PANEL
AG Ratio: 1.8 (calc) (ref 1.0–2.5)
ALT: 20 U/L (ref 6–29)
AST: 19 U/L (ref 10–35)
Albumin: 4.6 g/dL (ref 3.6–5.1)
Alkaline phosphatase (APISO): 73 U/L (ref 37–153)
BUN: 16 mg/dL (ref 7–25)
CO2: 26 mmol/L (ref 20–32)
Calcium: 9.8 mg/dL (ref 8.6–10.4)
Chloride: 104 mmol/L (ref 98–110)
Creat: 0.89 mg/dL (ref 0.50–1.05)
Globulin: 2.6 g/dL (calc) (ref 1.9–3.7)
Glucose, Bld: 98 mg/dL (ref 65–99)
Potassium: 4 mmol/L (ref 3.5–5.3)
Sodium: 141 mmol/L (ref 135–146)
Total Bilirubin: 0.4 mg/dL (ref 0.2–1.2)
Total Protein: 7.2 g/dL (ref 6.1–8.1)

## 2019-01-12 LAB — CBC WITH DIFFERENTIAL/PLATELET
Absolute Monocytes: 525 cells/uL (ref 200–950)
Basophils Absolute: 38 cells/uL (ref 0–200)
Basophils Relative: 0.6 %
Eosinophils Absolute: 147 cells/uL (ref 15–500)
Eosinophils Relative: 2.3 %
HCT: 40.9 % (ref 35.0–45.0)
Hemoglobin: 13.7 g/dL (ref 11.7–15.5)
Lymphs Abs: 1850 cells/uL (ref 850–3900)
MCH: 27.6 pg (ref 27.0–33.0)
MCHC: 33.5 g/dL (ref 32.0–36.0)
MCV: 82.5 fL (ref 80.0–100.0)
MPV: 10.4 fL (ref 7.5–12.5)
Monocytes Relative: 8.2 %
Neutro Abs: 3840 cells/uL (ref 1500–7800)
Neutrophils Relative %: 60 %
Platelets: 300 10*3/uL (ref 140–400)
RBC: 4.96 10*6/uL (ref 3.80–5.10)
RDW: 13.6 % (ref 11.0–15.0)
Total Lymphocyte: 28.9 %
WBC: 6.4 10*3/uL (ref 3.8–10.8)

## 2019-01-12 LAB — CEA: CEA: <0.5 ng/mL

## 2019-01-12 LAB — LIPID PANEL
Cholesterol: 201 mg/dL — ABNORMAL HIGH (ref <200)
HDL: 50 mg/dL (ref >=50)
LDL Cholesterol (Calc): 115 mg/dL (calc) — ABNORMAL HIGH
Non-HDL Cholesterol (Calc): 151 mg/dL (calc) — ABNORMAL HIGH (ref <130)
Total CHOL/HDL Ratio: 4 (calc) (ref <5.0)
Triglycerides: 250 mg/dL — ABNORMAL HIGH (ref <150)

## 2019-02-09 ENCOUNTER — Telehealth: Payer: Self-pay | Admitting: Internal Medicine

## 2019-02-09 NOTE — Telephone Encounter (Signed)
Virtual Visit Pre-Appointment Phone Call  "(Name), I am calling you today to discuss your upcoming appointment. We are currently trying to limit exposure to the virus that causes COVID-19 by seeing patients at home rather than in the office."  1. Confrm consent - "In the setting of the current Covid19 crisis, you are scheduled for a (phone or video) visit with your provider on (date) at (time).  Just as we do with many in-office visits, in order for you to participate in this visit, we must obtain consent.  If you'd like, I can send this to your mychart (if signed up) or email for you to review.  Otherwise, I can obtain your verbal consent now.  All virtual visits are billed to your insurance company just like a normal visit would be.  By agreeing to a virtual visit, we'd like you to understand that the technology does not allow for your provider to perform an examination, and thus may limit your provider's ability to fully assess your condition. If your provider identifies any concerns that need to be evaluated in person, we will make arrangements to do so.  Finally, though the technology is pretty good, we cannot assure that it will always work on either your or our end, and in the setting of a video visit, we may have to convert it to a phone-only visit.  In either situation, we cannot ensure that we have a secure connection.  Are you willing to proceed?" STAFF: Did the patient verbally acknowledge consent to telehealth visit? Document YES/NO here: Yes       FULL LENGTH CONSENT FOR TELE-HEALTH VISIT   I hereby voluntarily request, consent and authorize [facility] and its employed or contracted physicians, physician assistants, nurse practitioners or other licensed health care professionals (the Practitioner), to provide me with telemedicine health care services (the Services") as deemed necessary by the treating Practitioner. I acknowledge and consent to receive the Services by the  Practitioner via telemedicine. I understand that the telemedicine visit will involve communicating with the Practitioner through live audiovisual communication technology and the disclosure of certain medical information by electronic transmission. I acknowledge that I have been given the opportunity to request an in-person assessment or other available alternative prior to the telemedicine visit and am voluntarily participating in the telemedicine visit.  I understand that I have the right to withhold or withdraw my consent to the use of telemedicine in the course of my care at any time, without affecting my right to future care or treatment, and that the Practitioner or I may terminate the telemedicine visit at any time. I understand that I have the right to inspect all information obtained and/or recorded in the course of the telemedicine visit and may receive copies of available information for a reasonable fee.  I understand that some of the potential risks of receiving the Services via telemedicine include:   Delay or interruption in medical evaluation due to technological equipment failure or disruption;  Information transmitted may not be sufficient (e.g. poor resolution of images) to allow for appropriate medical decision making by the Practitioner; and/or   In rare instances, security protocols could fail, causing a breach of personal health information.  Furthermore, I acknowledge that it is my responsibility to provide information about my medical history, conditions and care that is complete and accurate to the best of my ability. I acknowledge that Practitioner's advice, recommendations, and/or decision may be based on factors not within their control, such  as incomplete or inaccurate data provided by me or distortions of diagnostic images or specimens that may result from electronic transmissions. I understand that the practice of medicine is not an exact science and that Practitioner makes  no warranties or guarantees regarding treatment outcomes. I acknowledge that I will receive a copy of this consent concurrently upon execution via email to the email address I last provided but may also request a printed copy by calling the office of [facility].   ° °I understand that my insurance will be billed for this visit.  ° °I have read or had this consent read to me. °• I understand the contents of this consent, which adequately explains the benefits and risks of the Services being provided via telemedicine.  °• I have been provided ample opportunity to ask questions regarding this consent and the Services and have had my questions answered to my satisfaction. °• I give my informed consent for the services to be provided through the use of telemedicine in my medical care ° °By participating in this telemedicine visit I agree to the above. ° °

## 2019-02-10 ENCOUNTER — Telehealth (INDEPENDENT_AMBULATORY_CARE_PROVIDER_SITE_OTHER): Payer: BC Managed Care – PPO | Admitting: Internal Medicine

## 2019-02-10 VITALS — Ht 61.5 in | Wt 145.0 lb

## 2019-02-10 DIAGNOSIS — Z85038 Personal history of other malignant neoplasm of large intestine: Secondary | ICD-10-CM | POA: Diagnosis not present

## 2019-02-10 DIAGNOSIS — F32A Depression, unspecified: Secondary | ICD-10-CM

## 2019-02-10 DIAGNOSIS — M1990 Unspecified osteoarthritis, unspecified site: Secondary | ICD-10-CM

## 2019-02-10 DIAGNOSIS — R079 Chest pain, unspecified: Secondary | ICD-10-CM | POA: Diagnosis not present

## 2019-02-10 DIAGNOSIS — E785 Hyperlipidemia, unspecified: Secondary | ICD-10-CM

## 2019-02-10 DIAGNOSIS — Z9049 Acquired absence of other specified parts of digestive tract: Secondary | ICD-10-CM | POA: Diagnosis not present

## 2019-02-10 DIAGNOSIS — F329 Major depressive disorder, single episode, unspecified: Secondary | ICD-10-CM

## 2019-02-10 MED ORDER — METOPROLOL TARTRATE 100 MG PO TABS: ORAL_TABLET | ORAL | 0 refills | Status: DC

## 2019-02-10 NOTE — Progress Notes (Signed)
Virtual Visit via Video Note   This visit type was conducted due to national recommendations for restrictions regarding the COVID-19 Pandemic (e.g. social distancing) in an effort to limit this patient's exposure and mitigate transmission in our community.  Due to her co-morbid illnesses, this patient is at least at moderate risk for complications without adequate follow up.  This format is felt to be most appropriate for this patient at this time.  All issues noted in this document were discussed and addressed.  A limited physical exam was performed with this format.  Please refer to the patient's chart for her consent to telehealth for [facility].   Date:  02/10/2019   ID:  Jacqueline Garza, DOB 01-08-1961, MRN [id number]  Patient Location: Home Provider Location: Office  PCP:  Tonia Ghent, MD  Cardiologist:  No primary care provider on file.  Electrophysiologist:  None   Evaluation Performed:  New Patient Evaluation  Chief Complaint:  Chest pain  History of Present Illness:    Jacqueline Garza is a 58 y.o. female with depression, GERD, rheumatoid arthritis, colon cancer jan 2017 requiring resection without chemotherapy or radiation.    She was referred for evaluation of 2 episodes of chest pain that have occurred in the middle of the night radiating to her back the right side of her neck and her right jaw and ear.  These episodes occurred in May and June she has had no more episodes since June.  She does occasionally have heartburn when she eats spaghetti or lasagna, she feels that this discomfort in her chest is different than her heartburn pain.  She has had one episode like this before approximately 6 months ago she was taking her grandson to school and was very upset with him and experienced substernal chest discomfort radiating to her left arm.  She chalked it up to emotional stress.  We discussed that emotional stress with chest pain can be a sign of coronary artery disease.   She will occasionally feel short of breath with activity but it is not exercise limiting.  She has no palpitations.  She snores 1-2 times a week and she is very tired.  She has had no lifetime episodes of syncope.  She denies dizziness, lightheadedness, PND, orthopnea, or significant leg swelling.  Never smoker. Rare etoh, no recreational drugs, no herbal supplement.   fhx - gm, ggm had CAD. No early mi or scd in the family.   She had a staging CT scan performed in 2017 that included a CT of the chest.  While this study was not gated, there were no definite coronary calcifications on CT chest in 2017.   The patient does not have symptoms concerning for COVID-19 infection (fever, chills, cough, or new shortness of breath).    Past Medical History:  Diagnosis Date  . 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 Basal cell carcinoma (BCC) of face   . Cancer ([place])    basal cell carcinoma / facial area and back / right leg   . Colon cancer ([place])   . Depression   . Dysplastic nevus of left lower extremity 11/06/2011  . Edema    with occ HCTZ use  . GERD (gastroesophageal reflux disease)   . History of colon cancer   . History of hiatal hernia   . Rheumatoid arthritis(714.0)    with prev MTX intolerance; per [facility]. [facility]  . Wears glasses  Past Surgical History:  Procedure Laterality Date  . CESAREAN SECTION    . COLONOSCOPY    . colonscopy      removed polyps  . LAPAROSCOPIC SIGMOID COLECTOMY N/A 08/14/2015   Procedure: LAPAROSCOPIC ENTEROLYSIS AND MOBILIZATION OF SPLENIC FLEXURE; SIGMOID COLECTOMY WITH PRIMARY HAND SEWN ANASTOMOSIS;  ENDING LAPAROSCOPY AND INJECTION OF EXPAREL;  Surgeon: Johnathan Hausen, MD;  Location: [facility];  Service: General;  Laterality: N/A;  . TUBAL LIGATION       Current Meds  Medication Sig  . clobetasol cream (TEMOVATE) 0.09 % Apply 1 application topically daily.  Marland Kitchen conjugated estrogens (PREMARIN) vaginal cream Place vaginally 2 (two)  times a week.  . DULoxetine (CYMBALTA) 30 MG capsule Take 1 a day with 60mg  for 90mg  total.  . DULoxetine (CYMBALTA) 60 MG capsule Take 1 a day with 30mg  for 90mg  total.   Current Facility-Administered Medications for the 02/10/19 encounter (Telemedicine) with Elouise Munroe, MD  Medication  . 0.9 %  sodium chloride infusion     Allergies:   Hydroxychloroquine, Methotrexate derivatives, Sulfa antibiotics, and Tessalon [benzonatate]   Social History   Tobacco Use  . Smoking status: Never Smoker  . Smokeless tobacco: Never Used  Substance Use Topics  . Alcohol use: Yes    Alcohol/week: 0.0 standard drinks    Comment: rarely   . Drug use: No     Family Hx: The patient's family history includes Breast cancer in her cousin; Hypertension in her father; Leukemia in her sister. There is no history of Colon cancer, Esophageal cancer, Rectal cancer, or Stomach cancer.  ROS:   Please see the history of present illness.     All other systems reviewed and are negative.   Prior CV studies:   The following studies were reviewed today:  ECG 01/11/2019, CT chest January 2017  Labs/Other Tests and Data Reviewed:    EKG:  An ECG dated 01/11/2019 was personally reviewed today and demonstrated:  Normal sinus rhythm, poor R wave progression in the anterior leads.  Recent Labs: 01/11/2019: ALT 20; BUN 16; Creat 0.89; Hemoglobin 13.7; Platelets 300; Potassium 4.0; Sodium 141   Recent Lipid Panel Lab Results  Component Value Date/Time   CHOL 201 (H) 01/11/2019 02:08 PM   TRIG 250 (H) 01/11/2019 02:08 PM   HDL 50 01/11/2019 02:08 PM   CHOLHDL 4.0 01/11/2019 02:08 PM   LDLCALC 115 (H) 01/11/2019 02:08 PM    Wt Readings from Last 3 Encounters:  02/10/19 145 lb (65.8 kg)  01/11/19 154 lb (69.9 kg)  02/15/18 144 lb 12 oz (65.7 kg)     Objective:    Vital Signs:  Ht 5' 1.5" (1.562 m)   Wt 145 lb (65.8 kg)   LMP 08/19/2010   BMI 26.95 kg/m    134/62 mmHg 01/11/2019.   VITAL  SIGNS:  reviewed GEN:  no acute distress EYES:  sclerae anicteric, EOMI - Extraocular Movements Intact RESPIRATORY:  normal respiratory effort, symmetric expansion CARDIOVASCULAR:  no peripheral edema SKIN:  no rash, lesions or ulcers. MUSCULOSKELETAL:  no obvious deformities. NEURO:  alert and oriented x 3, no obvious focal deficit PSYCH:  normal affect  ASSESSMENT & PLAN:    1. Chest pain, unspecified type   2. History of colon cancer   3. S/P colectomy   4. Inflammatory arthritis   5. Depression, unspecified depression type   6. Hyperlipidemia, unspecified hyperlipidemia type    Chest pain-she has had episodes of chest pain, and has risk factor  of inflammatory arthritis and family history of CAD.  Fortunately she has no coronary calcifications easily identified on a CT of the chest in 2017, therefore I think CT coronary angiogram would be a definitive assessment of her coronary arteries.  If she has normal coronaries and no calcium, we can feel slightly reassured that her chest pain episodes were not related to obstructive CAD.  We will also obtain an echocardiogram given that her ECG was not entirely normal with poor R wave progression anteriorly and episodes of chest pain.  Hyperlipidemia- she has elevated triglycerides and LDL and consideration for lipid-lowering therapy should be had.  We will await the results of her CT coronary angiogram for most appropriate dosing, though I believe initiating atorvastatin 40 mg daily would be appropriate.  COVID-19 Education: The signs and symptoms of COVID-19 were discussed with the patient and how to seek care for testing (follow up with PCP or arrange E-visit).  The importance of social distancing was discussed today.  Time:   Today, I have spent 30 minutes with the patient with telehealth technology discussing the above problems.  I spent an additional 15 minutes reviewing the chart, ECG, CT chest.   Medication Adjustments/Labs and Tests  Ordered: Current medicines are reviewed at length with the patient today.  Concerns regarding medicines are outlined above.   Tests Ordered: Orders Placed This Encounter  Procedures  . CT CORONARY MORPH W/CTA COR W/SCORE W/CA W/CM &/OR WO/CM  . CT CORONARY FRACTIONAL FLOW RESERVE DATA PREP  . CT CORONARY FRACTIONAL FLOW RESERVE FLUID ANALYSIS  . Basic metabolic panel  . ECHOCARDIOGRAM COMPLETE    Medication Changes: Meds ordered this encounter  Medications  . metoprolol tartrate (LOPRESSOR) 100 MG tablet    Sig: Take 1 tablet by mouth once for procedure.    Dispense:  1 tablet    Refill:  0    Follow Up:  Virtual Visit or In Person in 1 month(s)  Signed, Elouise Munroe, MD  02/10/2019 12:26 PM    [place]

## 2019-02-10 NOTE — Patient Instructions (Addendum)
Medication Instructions:  The current medical regimen is effective;  continue present plan and medications.  You will need to take the Metoprolol 100 mg; 2 hours before the CT.  If you need a refill on your cardiac medications before your next appointment, please call your pharmacy.   Lab work: Art gallery manager; one week before the CT. If you have labs (blood work) drawn today and your tests are completely normal, you will receive your results only by: Marland Kitchen MyChart Message (if you have MyChart) OR . A paper copy in the mail If you have any lab test that is abnormal or we need to change your treatment, we will call you to review the results.  Testing/Procedures: Your physician has requested that you have cardiac CT. Cardiac computed tomography (CT) is a painless test that uses an x-ray machine to take clear, detailed pictures of your heart. For further information please visit [facility]. Please follow instruction sheet as given.  Echocardiogram - Your physician has requested that you have an echocardiogram. Echocardiography is a painless test that uses sound waves to create images of your heart. It provides your doctor with information about the size and shape of your heart and how well your heart's chambers and valves are working. This procedure takes approximately one hour. There are no restrictions for this procedure. This will be performed at our [facility] location - [address], [address].   Follow-Up: At [facility], you and your health needs are our priority.  As part of our continuing mission to provide you with exceptional heart care, we have created designated Provider Care Teams.  These Care Teams include your primary Cardiologist (physician) and Advanced Practice Providers (APPs -  Physician Assistants and Nurse Practitioners) who all work together to provide you with the care you need, when you need it. You will need a follow up appointment in 1 months. You may see one of the  following Advanced Practice Providers on your designated Care Team:   Rosaria Ferries, PA-C . Jory Sims, DNP, ANP  Any Other Special Instructions Will Be Listed Below (If Applicable). Please arrive at the [facility] main entrance of [facility] at xx:xx AM (30-45 minutes prior to test start time)  [facility] [place], [place] [address] [phone]  Proceed to the [facility] Radiology Department (First Floor).  Please follow these instructions carefully (unless otherwise directed):  Hold all erectile dysfunction medications at least 48 hours prior to test.  On the Night Before the Test: . Be sure to Drink plenty of water. . Do not consume any caffeinated/decaffeinated beverages or chocolate 12 hours prior to your test. . Do not take any antihistamines 12 hours prior to your test. . If you take Metformin do not take 24 hours prior to test.  On the Day of the Test: . Drink plenty of water. Do not drink any water within one hour of the test. . Do not eat any food 4 hours prior to the test. . You may take your regular medications prior to the test.  . Take metoprolol (Lopressor) two hours prior to test. . HOLD Furosemide/Hydrochlorothiazide morning of the test.       After the Test: . Drink plenty of water. . After receiving IV contrast, you may experience a mild flushed feeling. This is normal. . On occasion, you may experience a mild rash up to 24 hours after the test. This is not dangerous. If this occurs, you can  take Benadryl 25 mg and increase your fluid intake. . If you experience trouble breathing, this can be serious. If it is severe call 911 IMMEDIATELY. If it is mild, please call our office. . If you take any of these medications: Glipizide/Metformin, Avandament, Glucavance, please do not take 48 hours after completing test.

## 2019-02-11 ENCOUNTER — Other Ambulatory Visit: Payer: Self-pay

## 2019-02-11 ENCOUNTER — Other Ambulatory Visit: Payer: BC Managed Care – PPO | Admitting: *Deleted

## 2019-02-11 LAB — BASIC METABOLIC PANEL
BUN/Creatinine Ratio: 16 (ref 9–23)
BUN: 15 mg/dL (ref 6–24)
CO2: 24 mmol/L (ref 20–29)
Calcium: 9.5 mg/dL (ref 8.7–10.2)
Chloride: 98 mmol/L (ref 96–106)
Creatinine, Ser: 0.93 mg/dL (ref 0.57–1.00)
GFR calc Af Amer: 78 mL/min/{1.73_m2} (ref >59)
GFR calc non Af Amer: 67 mL/min/{1.73_m2} (ref >59)
Glucose: 86 mg/dL (ref 65–99)
Potassium: 4.2 mmol/L (ref 3.5–5.2)
Sodium: 138 mmol/L (ref 134–144)

## 2019-02-14 ENCOUNTER — Encounter: Payer: Self-pay | Admitting: Family Medicine

## 2019-02-14 ENCOUNTER — Other Ambulatory Visit: Payer: BC Managed Care – PPO

## 2019-02-16 ENCOUNTER — Other Ambulatory Visit: Payer: BLUE CROSS/BLUE SHIELD

## 2019-02-17 ENCOUNTER — Ambulatory Visit (HOSPITAL_COMMUNITY): Payer: BC Managed Care – PPO | Attending: Cardiovascular Disease

## 2019-02-17 ENCOUNTER — Other Ambulatory Visit: Payer: Self-pay

## 2019-02-17 DIAGNOSIS — R079 Chest pain, unspecified: Secondary | ICD-10-CM | POA: Insufficient documentation

## 2019-02-21 ENCOUNTER — Encounter: Payer: Self-pay | Admitting: *Deleted

## 2019-02-21 ENCOUNTER — Encounter: Payer: BLUE CROSS/BLUE SHIELD | Admitting: Family Medicine

## 2019-02-22 ENCOUNTER — Ambulatory Visit (INDEPENDENT_AMBULATORY_CARE_PROVIDER_SITE_OTHER): Payer: BC Managed Care – PPO | Admitting: Family Medicine

## 2019-02-22 ENCOUNTER — Encounter: Payer: Self-pay | Admitting: Family Medicine

## 2019-02-22 VITALS — Ht 61.5 in | Wt 145.0 lb

## 2019-02-22 DIAGNOSIS — Z85038 Personal history of other malignant neoplasm of large intestine: Secondary | ICD-10-CM

## 2019-02-22 DIAGNOSIS — F329 Major depressive disorder, single episode, unspecified: Secondary | ICD-10-CM

## 2019-02-22 DIAGNOSIS — Z9049 Acquired absence of other specified parts of digestive tract: Secondary | ICD-10-CM

## 2019-02-22 DIAGNOSIS — Z0000 Encounter for general adult medical examination without abnormal findings: Secondary | ICD-10-CM

## 2019-02-22 DIAGNOSIS — Z7189 Other specified counseling: Secondary | ICD-10-CM

## 2019-02-22 DIAGNOSIS — F32A Depression, unspecified: Secondary | ICD-10-CM

## 2019-02-22 MED ORDER — DULOXETINE HCL 30 MG PO CPEP: ORAL_CAPSULE | ORAL | 3 refills | Status: DC

## 2019-02-22 MED ORDER — DULOXETINE HCL 60 MG PO CPEP: ORAL_CAPSULE | ORAL | 3 refills | Status: DC

## 2019-02-22 NOTE — Progress Notes (Signed)
Virtual visit completed through WebEx or similar program Patient location: home  Provider location: Financial controller at [facility] office   Pandemic considerations d/w pt.   Limitations and rationale for visit method d/w patient.  Patient agreed to proceed.   CC:  CPE.   HPI:  Tetanus 2015  Flu shot encouraged.  PNA and shingles shot not due.  Mammogram done in 2018, she'll schedule f/u.   Defer pap to gyn.  D/w pt.  She agrees.   DXA not due. D/w pt.  Diet and exercise d/w pt. Encouraged both. Diet is variable.  Colonoscopy 2018.  Living will d/w pt. Husband designated if patient were incapacitated.  HCV prev neg.   Prev labs d/w pt.  prev lipids were nonfasting.    Mood d/w pt. She has h/o anxiety.  Still on cymbalta 90mg  at baseline.  Some days with more sx than others.  She is working at [employer] as a Pharmacist, hospital.  They have enough room to distance at the new facility.  No SI/HI.    S/p colectomy.  Most recent CEA neg, d/w pt. No vomiting, no blood in stool.  No abd pain.    Meds and allergies reviewed.   ROS: Per HPI unless specifically indicated in ROS section   NAD Speech wnl  A/P:  Tetanus 2015  Flu shot encouraged.  PNA and shingles shot not due.  Mammogram done in 2018, she'll schedule f/u.   Defer pap to gyn.  D/w pt.  She agrees.   DXA not due. D/w pt.  Diet and exercise d/w pt. Encouraged both. Diet is variable.  Colonoscopy 2018.  Living will d/w pt. Husband designated if patient were incapacitated.  HCV prev neg.   Prev labs d/w pt.  prev lipids were nonfasting.  Continue wor, on diet and exercise.   Mood d/w pt. She has h/o anxiety.  Still on cymbalta 90mg  at baseline.  Some days with more sx than others.  She is working at [employer] as a Pharmacist, hospital.  They have enough room to distance at the new facility.  No SI/HI.  Would continue as is.   S/p colectomy.  Most recent CEA neg, d/w pt. No vomiting, no blood in stool.  No  abd pain.

## 2019-02-24 NOTE — Assessment & Plan Note (Signed)
S/p colectomy.  Most recent CEA neg, d/w pt. No vomiting, no blood in stool.  No abd pain.

## 2019-02-24 NOTE — Assessment & Plan Note (Signed)
Living will d/w pt.  Husband designated if patient were incapacitated.  

## 2019-02-24 NOTE — Assessment & Plan Note (Signed)
Mood d/w pt. She has h/o anxiety.  Still on cymbalta 90mg  at baseline.  Some days with more sx than others.  She is working at [employer] as a Pharmacist, hospital.  They have enough room to distance at the new facility.  No SI/HI.  Would continue as is.

## 2019-02-24 NOTE — Assessment & Plan Note (Signed)
  Tetanus 2015  Flu shot encouraged.  PNA and shingles shot not due.  Mammogram done in 2018, she'll schedule f/u.   Defer pap to gyn.  D/w pt.  She agrees.   DXA not due. D/w pt.  Diet and exercise d/w pt. Encouraged both. Diet is variable.  Colonoscopy 2018.  Living will d/w pt. Husband designated if patient were incapacitated.  HCV prev neg.   Prev labs d/w pt.  prev lipids were nonfasting.  Continue wor, on diet and exercise.

## 2019-03-04 ENCOUNTER — Telehealth (HOSPITAL_COMMUNITY): Payer: Self-pay | Admitting: Emergency Medicine

## 2019-03-04 NOTE — Telephone Encounter (Signed)
Left message on voicemail with name and callback number Keeton Kassebaum RN Navigator Cardiac Imaging [facility] [phone] Office [phone] Cell  

## 2019-03-07 ENCOUNTER — Encounter (HOSPITAL_COMMUNITY): Payer: Self-pay

## 2019-03-07 ENCOUNTER — Ambulatory Visit (HOSPITAL_COMMUNITY)
Admission: RE | Admit: 2019-03-07 | Discharge: 2019-03-07 | Disposition: A | Discharge status: Home / Self Care | Payer: BC Managed Care – PPO | Source: Ambulatory Visit | Attending: Internal Medicine | Admitting: Internal Medicine

## 2019-03-07 ENCOUNTER — Other Ambulatory Visit: Payer: Self-pay

## 2019-03-07 ENCOUNTER — Ambulatory Visit (HOSPITAL_COMMUNITY): Admission: RE | Admit: 2019-03-07 | Payer: BC Managed Care – PPO | Source: Ambulatory Visit

## 2019-03-07 ENCOUNTER — Telehealth: Payer: Self-pay | Admitting: *Deleted

## 2019-03-07 DIAGNOSIS — R079 Chest pain, unspecified: Secondary | ICD-10-CM

## 2019-03-07 MED ORDER — NITROGLYCERIN 0.4 MG SL SUBL
0.8000 mg | SUBLINGUAL_TABLET | Freq: Once | SUBLINGUAL | Status: AC
  Administered 2019-03-07: 0.8 mg via SUBLINGUAL
  Filled 2019-03-07: qty 25

## 2019-03-07 MED ORDER — IOHEXOL 350 MG/ML SOLN
80.0000 mL | Freq: Once | INTRAVENOUS | Status: AC | PRN
  Administered 2019-03-07: 80 mL via INTRAVENOUS

## 2019-03-07 MED ORDER — NITROGLYCERIN 0.4 MG SL SUBL
SUBLINGUAL_TABLET | SUBLINGUAL | Status: AC
  Filled 2019-03-07: qty 2

## 2019-03-07 NOTE — Telephone Encounter (Signed)
Left message for patient to call and schedule 1 month follow up appointment with PA for follow up of Cardiac CT scheduled on 03/07/19.

## 2019-03-07 NOTE — Discharge Instructions (Signed)
Cardiac CT Angiogram  A cardiac CT angiogram is a procedure to look at the heart and the area around the heart. It may be done to help find the cause of chest pains or other symptoms of heart disease. During this procedure, a large X-ray machine, called a CT scanner, takes detailed pictures of the heart and the surrounding area after a dye (contrast material) has been injected into blood vessels in the area. The procedure is also sometimes called a coronary CT angiogram, coronary artery scanning, or CTA. A cardiac CT angiogram allows the health care provider to see how well blood is flowing to and from the heart. The health care provider will be able to see if there are any problems, such as:  Blockage or narrowing of the coronary arteries in the heart.  Fluid around the heart.  Signs of weakness or disease in the muscles, valves, and tissues of the heart. Tell a health care provider about:  Any allergies you have. This is especially important if you have had a previous allergic reaction to contrast dye.  All medicines you are taking, including vitamins, herbs, eye drops, creams, and over-the-counter medicines.  Any blood disorders you have.  Any surgeries you have had.  Any medical conditions you have.  Whether you are pregnant or may be pregnant.  Any anxiety disorders, chronic pain, or other conditions you have that may increase your stress or prevent you from lying still. What are the risks? Generally, this is a safe procedure. However, problems may occur, including:  Bleeding.  Infection.  Allergic reactions to medicines or dyes.  Damage to other structures or organs.  Kidney damage from the dye or contrast that is used.  Increased risk of cancer from radiation exposure. This risk is low. Talk with your health care provider about: ? The risks and benefits of testing. ? How you can receive the lowest dose of radiation. What happens before the procedure?  Wear  comfortable clothing and remove any jewelry, glasses, dentures, and hearing aids.  Follow instructions from your health care provider about eating and drinking. This may include: ? For 12 hours before the test -- avoid caffeine. This includes tea, coffee, soda, energy drinks, and diet pills. Drink plenty of water or other fluids that do not have caffeine in them. Being well-hydrated can prevent complications. ? For 4-6 hours before the test -- stop eating and drinking. The contrast dye can cause nausea, but this is less likely if your stomach is empty.  Ask your health care provider about changing or stopping your regular medicines. This is especially important if you are taking diabetes medicines, blood thinners, or medicines to treat erectile dysfunction. What happens during the procedure?  Hair on your chest may need to be removed so that small sticky patches called electrodes can be placed on your chest. These will transmit information that helps to monitor your heart during the test.  An IV tube will be inserted into one of your veins.  You might be given a medicine to control your heart rate during the test. This will help to ensure that good images are obtained.  You will be asked to lie on an exam table. This table will slide in and out of the CT machine during the procedure.  Contrast dye will be injected into the IV tube. You might feel warm, or you may get a metallic taste in your mouth.  You will be given a medicine (nitroglycerin) to relax (dilate) the arteries   in your heart.  The table that you are lying on will move into the CT machine tunnel for the scan.  The person running the machine will give you instructions while the scans are being done. You may be asked to: ? Keep your arms above your head. ? Hold your breath. ? Stay very still, even if the table is moving.  When the scanning is complete, you will be moved out of the machine.  The IV tube will be removed. The  procedure may vary among health care providers and hospitals. What happens after the procedure?  You might feel warm, or you may get a metallic taste in your mouth from the contrast dye.  You may have a headache from the nitroglycerin.  After the procedure, drink water or other fluids to wash (flush) the contrast material out of your body.  Contact a health care provider if you have any symptoms of allergy to the contrast. These symptoms include: ? Shortness of breath. ? Rash or hives. ? A racing heartbeat.  Most people can return to their normal activities right after the procedure. Ask your health care provider what activities are safe for you.  It is up to you to get the results of your procedure. Ask your health care provider, or the department that is doing the procedure, when your results will be ready. Summary  A cardiac CT angiogram is a procedure to look at the heart and the area around the heart. It may be done to help find the cause of chest pains or other symptoms of heart disease.  During this procedure, a large X-ray machine, called a CT scanner, takes detailed pictures of the heart and the surrounding area after a dye (contrast material) has been injected into blood vessels in the area.  Ask your health care provider about changing or stopping your regular medicines before the procedure. This is especially important if you are taking diabetes medicines, blood thinners, or medicines to treat erectile dysfunction.  After the procedure, drink water or other fluids to wash (flush) the contrast material out of your body. This information is not intended to replace advice given to you by your health care provider. Make sure you discuss any questions you have with your health care provider. Document Released: 06/19/2008 Document Revised: 06/19/2017 Document Reviewed: 05/26/2016 Elsevier Patient Education  2020 [employer] Testing With IV Contrast Material IV contrast material  is a fluid that is used with some imaging tests. It is injected into your body through a vein. Contrast material is used when your health care providers need a detailed look at organs, tissues, or blood vessels that may not show up with the standard test. The material may be used when an X-ray, an MRI, a CT scan, or an ultrasound is done. IV contrast material may be used for imaging tests that check:  Muscles, skin, and fat.  Breasts.  Brain.  Digestive tract.  Heart.  Organs such as the liver, kidneys, lungs, bladder, and many others.  Arteries and veins. Tell a health care provider about:  Any allergies you have, especially an allergy to contrast material.  All medicines you are taking, including metformin, beta blockers, NSAIDs (such as ibuprofen), interleukin-2, vitamins, herbs, eye drops, creams, and over-the-counter medicines.  Any problems you or family members have had with the use of contrast material.  Any blood disorders you have, such as sickle cell anemia.  Any surgeries you have had.  Any medical conditions you have or   have had, especially alcohol abuse, dehydration, asthma, or kidney, liver, or heart problems.  Whether you are pregnant or may be pregnant.  Whether you are breastfeeding. Most contrast materials are safe for use in breastfeeding women. What are the risks? Generally, this is a safe procedure. However, problems may occur, including:  Headache.  Itching, skin rash, and hives.  Nausea and vomiting.  Allergic reactions.  Wheezing or difficulty breathing.  Abnormal heart rate.  Changes in blood pressure.  Throat swelling.  Kidney damage. What happens before the procedure? Medicines Ask your health care provider about:  Changing or stopping your regular medicines. This is especially important if you are taking diabetes medicines or blood thinners.  Taking medicines such as aspirin and ibuprofen. These medicines can thin your blood. Do  not take these medicines unless your health care provider tells you to take them.  Taking over-the-counter medicines, vitamins, herbs, and supplements. If you are at risk of having a reaction to the IV contrast material, you may be asked to take medicine before the procedure to prevent a reaction. General instructions  Follow instructions from your health care provider about eating or drinking restrictions.  You may have an exam or lab tests to make sure that you can safely get IV contrast material.  Ask if you will be given a medicine to help you relax (sedative) during the procedure. If so, plan to have someone take you home from the hospital or clinic. What happens during the procedure?  You may be given a sedative to help you relax.  An IV will be inserted into one of your veins.  Contrast material will be injected into your IV.  You may feel warmth or flushing as the contrast material enters your bloodstream.  You may have a metallic taste in your mouth for a few minutes.  The needle may cause some discomfort and bruising.  After the contrast material is in your body, the imaging test will be done. The procedure may vary among health care providers and hospitals. What can I expect after the procedure?  The IV will be removed.  You may be taken to a recovery area if sedation medicines were used. Your blood pressure, heart rate, breathing rate, and blood oxygen level will be monitored until you leave the hospital or clinic. Follow these instructions at home:   Take over-the-counter and prescription medicines only as told by your health care provider. ? Your health care provider may tell you to not take certain medicines for a couple of days after the procedure. This is especially important if you are taking diabetes medicines.  If you are told, drink enough fluid to keep your urine pale yellow. This will help to remove the contrast material out of your body.  Do not drive  for 24 hours if you were given a sedative during your procedure.  It is up to you to get the results of your procedure. Ask your health care provider, or the department that is doing the procedure, when your results will be ready.  Keep all follow-up visits as told by your health care provider. This is important. Contact a health care provider if:  You have redness, swelling, or pain near your IV site. Get help right away if:  You have an abnormal heart rhythm.  You have trouble breathing.  You have: ? Chest pain. ? Pain in your back, neck, arm, jaw, or stomach. ? Nausea or sweating. ? Hives or a rash.  You start   shaking and cannot stop. These symptoms may represent a serious problem that is an emergency. Do not wait to see if the symptoms will go away. Get medical help right away. Call your local emergency services (911 in the [place] Do not drive yourself to the hospital. Summary  IV contrast material may be used for imaging tests to help your health care providers see your organs and tissues more clearly.  Tell your health care provider if you are pregnant or may be pregnant.  During the procedure, you may feel warmth or flushing as the contrast material enters your bloodstream.  After the procedure, drink enough fluid to keep your urine pale yellow. This information is not intended to replace advice given to you by your health care provider. Make sure you discuss any questions you have with your health care provider. Document Released: 06/25/2009 Document Revised: 09/23/2018 Document Reviewed: 09/23/2018 Elsevier Patient Education  2020 Elsevier Inc.  

## 2019-03-07 NOTE — Progress Notes (Signed)
Pt tolerated exam without incident.  PIV removed and dressing applied.  Pt given caffeinated beverage and crackers post exam.  Discharge instructions discussed.  Pt discharged

## 2019-03-10 NOTE — Telephone Encounter (Signed)
Left message for patient to call and schedule 1 month follow up appointemnt with Jory Sims, DNP per Dr. Perrin Maltese

## 2019-04-06 ENCOUNTER — Other Ambulatory Visit: Payer: Self-pay

## 2019-04-06 ENCOUNTER — Ambulatory Visit: Payer: BC Managed Care – PPO | Admitting: General Practice

## 2019-04-06 ENCOUNTER — Encounter: Payer: Self-pay | Admitting: Adult Health

## 2019-04-06 VITALS — BP 122/82 | HR 74 | Ht 61.0 in | Wt 154.4 lb

## 2019-04-06 DIAGNOSIS — R079 Chest pain, unspecified: Secondary | ICD-10-CM

## 2019-04-06 DIAGNOSIS — E785 Hyperlipidemia, unspecified: Secondary | ICD-10-CM | POA: Diagnosis not present

## 2019-04-06 NOTE — Progress Notes (Signed)
Cardiology Clinic Note   Patient Name: JANNEY HENLY Date of Encounter: 04/06/2019  Primary Care Provider:  Tonia Ghent, MD Primary Cardiologist:  Elouise Munroe, MD  Patient Profile    Jacqueline Garza 58 year old female presents today for follow-up evaluation of her chest pain.  Past Medical History    Past Medical History:  Diagnosis Date   Angiolipoma of kidney    prev with uro eval 2012   Basal cell carcinoma (BCC) of face    Cancer (HCC)    basal cell carcinoma / facial area and back / right leg    Colon cancer ([place])    Depression    Dysplastic nevus of left lower extremity 11/06/2011   Edema    with occ HCTZ use   GERD (gastroesophageal reflux disease)    History of colon cancer    History of hiatal hernia    Rheumatoid arthritis(714.0)    with prev MTX intolerance; per Dr. [facility]   Wears glasses    Past Surgical History:  Procedure Laterality Date   CESAREAN SECTION     COLON SURGERY     COLONOSCOPY     colonscopy      removed polyps   LAPAROSCOPIC SIGMOID COLECTOMY N/A 08/14/2015   Procedure: LAPAROSCOPIC ENTEROLYSIS AND MOBILIZATION OF SPLENIC FLEXURE; SIGMOID COLECTOMY WITH PRIMARY HAND SEWN ANASTOMOSIS;  ENDING LAPAROSCOPY AND INJECTION OF EXPAREL;  Surgeon: Johnathan Hausen, MD;  Location: [facility];  Service: General;  Laterality: N/A;   TUBAL LIGATION      Allergies  Allergies  Allergen Reactions   Hydroxychloroquine Itching   Methotrexate Derivatives Other (See Comments)    Elevated liver test   Sulfa Antibiotics Itching   Tessalon [Benzonatate] Other (See Comments)    Lack of effect.     History of Present Illness    Ms. Coonan was last seen by Dr. Margaretann Loveless on 02/10/2019.  During that time she described 2 episodes of chest pain that occurred in the middle of the night that radiated to her back the right side of her neck in her right jaw.  The episodes occurred in May and in June.  She stated she does  occasionally have heartburn when she eats spaghetti or lasagna however, she feels the discomfort in her chest is different from her heartburn.  She also describes an episode of chest pain 6 months ago while she was taking her grandson to school and became upset with him experiencing substernal chest pain radiating to her left arm.  She also noted that she occasionally feels short of breath with activity however the shortness of breath was not exercise limiting.  On 03/07/2019 she underwent CTA that showed no evidence of CAD, and coronary calcium score of 0.  Her PMH also colon cancer, depression, GERD, hiatal hernia, RA, and angiolipoma of the kidney.  She presents to the clinic today and states she has a 1 recurrent episode of chest discomfort while she was driving that lasted a couple minutes and resolved spontaneously.  She states that she had pain radiating down her left arm.  She goes on to state that she is a very anxious person and taking 90 mg of Cymbalta.  She also notices she has pain in the middle of her back consistently when she stands for long periods of time.  She does not notice any increased dyspnea on exertion.  She denies chest pain, shortness of breath, lower extremity edema, fatigue, palpitations, melena, hematuria, hemoptysis, diaphoresis, weakness,  presyncope, syncope, orthopnea, and PND.  Home Medications    Prior to Admission medications   Medication Sig Start Date End Date Taking? Authorizing Provider  clobetasol cream (TEMOVATE) [id number] % Apply 1 application topically daily.    [provider]  conjugated estrogens (PREMARIN) vaginal cream Place vaginally 2 (two) times a week. 07/27/17   Princess Bruins, MD  DULoxetine (CYMBALTA) 30 MG capsule Take 1 a day with 60mg  for 90mg  total. 02/22/19   Tonia Ghent, MD  DULoxetine (CYMBALTA) 60 MG capsule Take 1 a day with 30mg  for 90mg  total. 02/22/19   Tonia Ghent, MD    Family History    Family History  Problem  Relation Age of Onset   Hypertension Father    Leukemia Sister    Breast cancer Cousin    Colon cancer Neg Hx    Esophageal cancer Neg Hx    Rectal cancer Neg Hx    Stomach cancer Neg Hx    She indicated that her mother is alive. She indicated that her father is alive. She indicated that her sister is deceased. She indicated that the status of her cousin is unknown. She indicated that the status of her neg hx is unknown.  Social History    Social History   Socioeconomic History   Marital status: Married    Spouse name: Not on file   Number of children: 2   Years of education: Not on file   Highest education level: Not on file  Occupational History   Occupation: sub Pharmacist, hospital at [employer] resource strain: Not on file   Food insecurity    Worry: Not on file    Inability: Not on file   Transportation needs    Medical: Not on file    Non-medical: Not on file  Tobacco Use   Smoking status: Never Smoker   Smokeless tobacco: Never Used  Substance and Sexual Activity   Alcohol use: Yes    Alcohol/week: 0.0 standard drinks    Comment: rarely    Drug use: No   Sexual activity: Not on file  Lifestyle   Physical activity    Days per week: Not on file    Minutes per session: Not on file   Stress: Not on file  Relationships   Social connections    Talks on phone: Not on file    Gets together: Not on file    Attends religious service: Not on file    Active member of club or organization: Not on file    Attends meetings of clubs or organizations: Not on file    Relationship status: Not on file   Intimate partner violence    Fear of current or ex partner: Not on file    Emotionally abused: Not on file    Physically abused: Not on file    Forced sexual activity: Not on file  Other Topics Concern   Not on file  Social History Narrative   Married 1978   Out of work since 06/2012   2 children (boy and girl)     Review of  Systems    General:  No chills, fever, night sweats or weight changes.  Cardiovascular:  No chest pain, dyspnea on exertion, edema, orthopnea, palpitations, paroxysmal nocturnal dyspnea. Dermatological: No rash, lesions/masses Respiratory: No cough, dyspnea Urologic: No hematuria, dysuria Abdominal:   No nausea, vomiting, diarrhea, bright red blood per rectum, melena, or hematemesis Neurologic:  No visual changes,  wkns, changes in mental status. All other systems reviewed and are otherwise negative except as noted above.  Physical Exam    VS:  BP 122/82    Pulse 74    Ht 5\' 1"  (1.549 m)    Wt 154 lb 6.4 oz (70 kg)    LMP 08/19/2010    SpO2 99%    BMI 29.17 kg/m  , BMI Body mass index is 29.17 kg/m. GEN: Well nourished, well developed, in no acute distress. HEENT: normal. Neck: Supple, no JVD, carotid bruits, or masses. Cardiac: RRR, no murmurs, rubs, or gallops. No clubbing, cyanosis, edema.  Radials/DP/PT 2+ and equal bilaterally.  Respiratory:  Respirations regular and unlabored, clear to auscultation bilaterally. GI: Soft, nontender, nondistended, BS + x 4. MS: no deformity or atrophy. Skin: warm and dry, no rash. Neuro:  Strength and sensation are intact. Psych: Normal affect.  Accessory Clinical Findings    ECG personally reviewed by me today-none today   CT coronary morphology w/CTA COR w/score  Aorta: Normal size. Mild calcifications in the aortic arch. No dissection.  Aortic Valve:  Trileaflet.  No calcifications.  Coronary Arteries:  Normal coronary origin.  Right dominance.  RCA is a large dominant artery that gives rise to PDA and PLA. There is no plaque.  Left main is a large artery that gives rise to LAD and LCX arteries. Left main has no plaque.  LAD is a large vessel that gives rise to two diagonal arteries and has no plaque.  LCX is a non-dominant artery that gives rise to one large OM1 branch. There is no plaque.  Other findings:  Normal  pulmonary vein drainage into the left atrium.  Normal left atrial appendage without a thrombus.  Normal size of the pulmonary artery.  IMPRESSION: 1. Coronary calcium score of 0. This was 0 percentile for age and sex matched control.  2. Normal coronary origin with right dominance.  3. No evidence of CAD.  CAD-RADS 0. No evidence of CAD (0%). Consider non-atherosclerotic causes of chest pain.  Echocardiogram 02/17/2019 IMPRESSIONS   1. The left ventricle has normal systolic function with an ejection fraction of 60-65%. The cavity size was normal. Left ventricular diastolic parameters were normal.  2. Normal GLS -19.4.  3. The right ventricle has normal systolic function. The cavity was normal. There is no increase in right ventricular wall thickness.  4. Mild thickening of the mitral valve leaflet.  5. The aortic valve is tricuspid. Mild thickening of the aortic valve. Mild calcification of the aortic valve.  6. The aorta is normal in size and structure.  Assessment & Plan   1.  Chest pain/back pain- coronary CTA with a calcium score of 0, normal coronary origin with right dominance, and no evidence of CAD.  Echocardiogram showed LVEF 60 to 65%, mild thickening of mitral valve, mild thickening of aortic valve with mild calcification of the aortic valve.  Pain seems to be neurologic in nature. No exertional symptoms or shortness of breath. Recommended follow-up with PCP if recurrence.  2.  Hyperlipidemia-01/11/2019: Cholesterol 201; HDL 50; LDL Cholesterol (Calc) 115; Triglycerides 250 Hold atorvastatin- given coronary CTA result. Heart healthy low-sodium diet-increase fiber Increase physical activity as tolerated   Both coronary CTA and echocardiogram reviewed with patient.  She expressed understanding and has no further questions at this time.  Disposition: Follow-up Dr.Acharya in 1 year  Deberah Pelton, NP-C 04/06/2019, 5:25 PM

## 2019-04-06 NOTE — Patient Instructions (Addendum)
Medication Instructions:  Continue current medications  If you need a refill on your cardiac medications before your next appointment, please call your pharmacy.  Labwork: None Ordered   Testing/Procedures: None Ordered   Follow-Up: You will need a follow up appointment in 1 Year.  Please call our office 2 months in advance to schedule this appointment.  You may see Dr Margaretann Loveless or one of the following Advanced Practice Providers on your designated Care Team:   Rosaria Ferries, PA-C . Jory Sims, DNP, ANP     At [facility], you and your health needs are our priority.  As part of our continuing mission to provide you with exceptional heart care, we have created designated Provider Care Teams.  These Care Teams include your primary Cardiologist (physician) and Advanced Practice Providers (APPs -  Physician Assistants and Nurse Practitioners) who all work together to provide you with the care you need, when you need it.  Thank you for choosing [facility] at [facility]!!     Mindfulness-Based Stress Reduction Mindfulness-based stress reduction (MBSR) is a program that helps people learn to practice mindfulness. Mindfulness is the practice of intentionally paying attention to the present moment. It can be learned and practiced through techniques such as education, breathing exercises, meditation, and yoga. MBSR includes several mindfulness techniques in one program. MBSR works best when you understand the treatment, are willing to try new things, and can commit to spending time practicing what you learn. MBSR training may include learning about:  How your emotions, thoughts, and reactions affect your body.  New ways to respond to things that cause negative thoughts to start (triggers).  How to notice your thoughts and let go of them.  Practicing awareness of everyday things that you normally do without thinking.  The techniques and goals of different types of meditation. What  are the benefits of MBSR? MBSR can have many benefits, which include helping you to:  Develop self-awareness. This refers to knowing and understanding yourself.  Learn skills and attitudes that help you to participate in your own health care.  Learn new ways to care for yourself.  Be more accepting about how things are, and let things go.  Be less judgmental and approach things with an open mind.  Be patient with yourself and trust yourself more. MBSR has also been shown to:  Reduce negative emotions, such as depression and anxiety.  Improve memory and focus.  Change how you sense and approach pain.  Boost your body's ability to fight infections.  Help you connect better with other people.  Improve your sense of well-being. Follow these instructions at home:   Find a local in-person or online MBSR program.  Set aside some time regularly for mindfulness practice.  Find a mindfulness practice that works best for you. This may include one or more of the following: ? Meditation. Meditation involves focusing your mind on a certain thought or activity. ? Breathing awareness exercises. These help you to stay present by focusing on your breath. ? Body scan. For this practice, you lie down and pay attention to each part of your body from head to toe. You can identify tension and soreness and intentionally relax parts of your body. ? Yoga. Yoga involves stretching and breathing, and it can improve your ability to move and be flexible. It can also provide an experience of testing your body's limits, which can help you release stress. ? Mindful eating. This way of eating involves focusing on the taste, texture,  color, and smell of each bite of food. Because this slows down eating and helps you feel full sooner, it can be an important part of a weight-loss plan.  Find a podcast or recording that provides guidance for breathing awareness, body scan, or meditation exercises. You can listen  to these any time when you have a free moment to rest without distractions.  Follow your treatment plan as told by your health care provider. This may include taking regular medicines and making changes to your diet or lifestyle as recommended. How to practice mindfulness To do a basic awareness exercise:  Find a comfortable place to sit.  Pay attention to the present moment. Observe your thoughts, feelings, and surroundings just as they are.  Avoid placing judgment on yourself, your feelings, or your surroundings. Make note of any judgment that comes up, and let it go.  Your mind may wander, and that is okay. Make note of when your thoughts drift, and return your attention to the present moment. To do basic mindfulness meditation:  Find a comfortable place to sit. This may include a stable chair or a firm floor cushion. ? Sit upright with your back straight. Let your arms fall next to your side with your hands resting on your legs. ? If sitting in a chair, rest your feet flat on the floor. ? If sitting on a cushion, cross your legs in front of you.  Keep your head in a neutral position with your chin dropped slightly. Relax your jaw and rest the tip of your tongue on the roof of your mouth. Drop your gaze to the floor. You can close your eyes if you like.  Breathe normally and pay attention to your breath. Feel the air moving in and out of your nose. Feel your belly expanding and relaxing with each breath.  Your mind may wander, and that is okay. Make note of when your thoughts drift, and return your attention to your breath.  Avoid placing judgment on yourself, your feelings, or your surroundings. Make note of any judgment or feelings that come up, let them go, and bring your attention back to your breath.  When you are ready, lift your gaze or open your eyes. Pay attention to how your body feels after the meditation. Where to find more information You can find more information about  MBSR from:  Your health care provider.  Community-based meditation centers or programs.  Programs offered near you. Summary  Mindfulness-based stress reduction (MBSR) is a program that teaches you how to intentionally pay attention to the present moment. It is used with other treatments to help you cope better with daily stress, emotions, and pain.  MBSR focuses on developing self-awareness, which allows you to respond to life stress without judgment or negative emotions.  MBSR programs may involve learning different mindfulness practices, such as breathing exercises, meditation, yoga, body scan, or mindful eating. Find a mindfulness practice that works best for you, and set aside time for it on a regular basis. This information is not intended to replace advice given to you by your health care provider. Make sure you discuss any questions you have with your health care provider. Document Released: 11/13/2016 Document Revised: 06/19/2017 Document Reviewed: 11/13/2016 Elsevier Patient Education  2020 [employer].

## 2019-05-17 ENCOUNTER — Encounter

## 2019-05-17 MED ORDER — FAMCICLOVIR 500 MG PO TABS
500 MG | ORAL_TABLET | ORAL | 0 refills | Status: DC
Start: 2019-05-17 — End: 2019-07-11

## 2019-05-20 ENCOUNTER — Encounter

## 2019-05-20 MED ORDER — IBUPROFEN 800 MG PO TABS
800 MG | ORAL_TABLET | Freq: Three times a day (TID) | ORAL | 0 refills | Status: DC | PRN
Start: 2019-05-20 — End: 2019-07-11

## 2019-05-20 MED ORDER — BUPROPION HCL ER (XL) 300 MG PO TB24
300 MG | ORAL_TABLET | ORAL | 0 refills | Status: DC
Start: 2019-05-20 — End: 2019-07-11

## 2019-06-13 ENCOUNTER — Encounter

## 2019-06-22 ENCOUNTER — Other Ambulatory Visit: Payer: Self-pay | Admitting: Family Medicine

## 2019-06-22 DIAGNOSIS — Z85038 Personal history of other malignant neoplasm of large intestine: Secondary | ICD-10-CM

## 2019-06-23 ENCOUNTER — Telehealth: Payer: Self-pay | Admitting: Family Medicine

## 2019-06-23 NOTE — Telephone Encounter (Signed)
Left patient a message to call back and schedule appointment.     Needs lab appointment for follow-up CEA. please schedule Received: Yesterday Message Contents  Tonia Ghent, MD  Genella Rife H        I put in the order. Thanks.

## 2019-06-24 ENCOUNTER — Other Ambulatory Visit (INDEPENDENT_AMBULATORY_CARE_PROVIDER_SITE_OTHER): Payer: BC Managed Care – PPO

## 2019-06-24 ENCOUNTER — Encounter

## 2019-06-24 DIAGNOSIS — Z85038 Personal history of other malignant neoplasm of large intestine: Secondary | ICD-10-CM

## 2019-06-24 NOTE — Telephone Encounter (Signed)
Pt is scheduled for 06/24/19 @ 2pm

## 2019-06-25 LAB — CEA: CEA: 0.9 ng/mL

## 2019-07-11 ENCOUNTER — Ambulatory Visit
Admit: 2019-07-11 | Discharge: 2019-07-11 | Payer: BLUE CROSS/BLUE SHIELD | Attending: Family Medicine | Primary: Family Medicine

## 2019-07-11 DIAGNOSIS — I10 Essential (primary) hypertension: Secondary | ICD-10-CM

## 2019-07-11 MED ORDER — BUPROPION HCL ER (XL) 300 MG PO TB24
300 MG | ORAL_TABLET | ORAL | 1 refills | Status: DC
Start: 2019-07-11 — End: 2020-01-11

## 2019-07-11 MED ORDER — FAMCICLOVIR 500 MG PO TABS
500 MG | ORAL_TABLET | ORAL | 1 refills | Status: DC
Start: 2019-07-11 — End: 2020-01-11

## 2019-07-11 MED ORDER — IBUPROFEN 800 MG PO TABS
800 MG | ORAL_TABLET | Freq: Three times a day (TID) | ORAL | 1 refills | Status: DC | PRN
Start: 2019-07-11 — End: 2020-01-11

## 2019-08-03 LAB — COMPREHENSIVE METABOLIC PANEL

## 2019-08-03 LAB — HEMOGLOBIN A1C: Hemoglobin A1C: 6.1 %

## 2019-08-03 LAB — CBC WITH AUTO DIFFERENTIAL

## 2019-08-03 LAB — LIPID PANEL
Chol/HDL Ratio: 2.7
Cholesterol, Total: 199 mg/dL
LDL Calculated: 114 mg/dL (ref 0–160)

## 2019-08-17 ENCOUNTER — Encounter

## 2019-08-17 NOTE — Progress Notes (Signed)
The 10-year ASCVD risk score Denman George DC Montez Hageman., et al., 2013) is: 4.3%    Values used to calculate the score:      Age: 59 years      Sex: Female      Is Non-Hispanic African American: No      Diabetic: No      Tobacco smoker: Yes      Systolic Blood Pressure: 112 mmHg      Is BP treated: No      HDL Cholesterol: 75 mg/dL      Total Cholesterol: 199 mg/dL

## 2019-11-20 ENCOUNTER — Other Ambulatory Visit: Payer: Self-pay | Admitting: Family Medicine

## 2019-11-20 DIAGNOSIS — E785 Hyperlipidemia, unspecified: Secondary | ICD-10-CM

## 2019-11-20 DIAGNOSIS — Z85038 Personal history of other malignant neoplasm of large intestine: Secondary | ICD-10-CM

## 2019-12-22 ENCOUNTER — Encounter

## 2020-01-11 ENCOUNTER — Telehealth: Admit: 2020-01-11 | Payer: BLUE CROSS/BLUE SHIELD | Attending: Family Medicine | Primary: Family Medicine

## 2020-01-11 DIAGNOSIS — J019 Acute sinusitis, unspecified: Secondary | ICD-10-CM

## 2020-01-11 MED ORDER — FAMCICLOVIR 500 MG PO TABS
500 MG | ORAL_TABLET | ORAL | 1 refills | Status: DC
Start: 2020-01-11 — End: 2020-07-02

## 2020-01-11 MED ORDER — GUAIFENESIN ER 600 MG PO TB12
600 MG | ORAL_TABLET | Freq: Two times a day (BID) | ORAL | 0 refills | Status: AC
Start: 2020-01-11 — End: 2020-01-26

## 2020-01-11 MED ORDER — FLUTICASONE PROPIONATE 50 MCG/ACT NA SUSP
50 MCG/ACT | Freq: Every day | NASAL | 3 refills | Status: DC
Start: 2020-01-11 — End: 2020-07-02

## 2020-01-11 MED ORDER — IBUPROFEN 800 MG PO TABS
800 MG | ORAL_TABLET | Freq: Three times a day (TID) | ORAL | 1 refills | Status: DC | PRN
Start: 2020-01-11 — End: 2020-07-04

## 2020-01-11 MED ORDER — METHYLPREDNISOLONE 4 MG PO TBPK
4 MG | PACK | ORAL | 0 refills | Status: AC
Start: 2020-01-11 — End: 2020-01-17

## 2020-01-11 MED ORDER — AMOXICILLIN 500 MG PO CAPS
500 MG | ORAL_CAPSULE | Freq: Three times a day (TID) | ORAL | 0 refills | Status: AC
Start: 2020-01-11 — End: 2020-01-18

## 2020-01-11 MED ORDER — BUPROPION HCL ER (XL) 300 MG PO TB24
300 MG | ORAL_TABLET | ORAL | 1 refills | Status: DC
Start: 2020-01-11 — End: 2020-07-04

## 2020-02-13 LAB — HM MAMMOGRAPHY

## 2020-02-20 ENCOUNTER — Encounter: Payer: Self-pay | Admitting: Family Medicine

## 2020-03-15 ENCOUNTER — Other Ambulatory Visit: Payer: Self-pay | Admitting: Family Medicine

## 2020-04-09 ENCOUNTER — Ambulatory Visit: Payer: BC Managed Care – PPO | Admitting: Internal Medicine

## 2020-04-09 NOTE — Progress Notes (Deleted)
Cardiology Office Note:    Date:  04/09/2020   ID:  Jacqueline Garza, DOB Sep 02, 1960, MRN [id number]  PCP:  Tonia Ghent, MD  Cardiologist:  Elouise Munroe, MD  Electrophysiologist:  None   Referring MD: Tonia Ghent, MD   Chief Complaint/Reason for Referral: ***  History of Present Illness:    Jacqueline Garza is a 59 y.o. female with a history of ***  Past Medical History:  Diagnosis Date  . 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 Basal cell carcinoma (BCC) of face   . Cancer ([place])    basal cell carcinoma / facial area and back / right leg   . Colon cancer ([place])   . Depression   . Dysplastic nevus of left lower extremity 11/06/2011  . Edema    with occ HCTZ use  . GERD (gastroesophageal reflux disease)   . History of colon cancer   . History of hiatal hernia   . Rheumatoid arthritis(714.0)    with prev MTX intolerance; per [facility]  . Wears glasses     Past Surgical History:  Procedure Laterality Date  . CESAREAN SECTION    . COLON SURGERY    . COLONOSCOPY    . colonscopy      removed polyps  . LAPAROSCOPIC SIGMOID COLECTOMY N/A 08/14/2015   Procedure: LAPAROSCOPIC ENTEROLYSIS AND MOBILIZATION OF SPLENIC FLEXURE; SIGMOID COLECTOMY WITH PRIMARY HAND SEWN ANASTOMOSIS;  ENDING LAPAROSCOPY AND INJECTION OF EXPAREL;  Surgeon: Johnathan Hausen, MD;  Location: [facility];  Service: General;  Laterality: N/A;  . TUBAL LIGATION      Current Medications: No outpatient medications have been marked as taking for the 04/09/20 encounter (Appointment) with Elouise Munroe, MD.     Allergies:   Hydroxychloroquine, Methotrexate derivatives, Sulfa antibiotics, and Tessalon [benzonatate]   Social History   Tobacco Use  . Smoking status: Never Smoker  . Smokeless tobacco: Never Used  Substance Use Topics  . Alcohol use: Yes    Alcohol/week: 0.0 standard drinks    Comment: rarely   . Drug use: No     Family History: The patient's family  history includes Breast cancer in her cousin; Hypertension in her father; Leukemia in her sister. There is no history of Colon cancer, Esophageal cancer, Rectal cancer, or Stomach cancer.  ROS:   Please see the history of present illness.    All other systems reviewed and are negative.  EKGs/Labs/Other Studies Reviewed:    The following studies were reviewed today:  EKG:  ***  I have independently reviewed the images from ***.  Recent Labs: No results found for requested labs within last 8760 hours.  Recent Lipid Panel    Component Value Date/Time   CHOL 201 (H) 01/11/2019 1408   TRIG 250 (H) 01/11/2019 1408   HDL 50 01/11/2019 1408   CHOLHDL 4.0 01/11/2019 1408   VLDL 28.2 02/10/2018 0755   LDLCALC 115 (H) 01/11/2019 1408    Physical Exam:    VS:  LMP 08/19/2010     Wt Readings from Last 5 Encounters:  04/06/19 154 lb 6.4 oz (70 kg)  02/22/19 145 lb (65.8 kg)  02/10/19 145 lb (65.8 kg)  01/11/19 154 lb (69.9 kg)  02/15/18 144 lb 12 oz (65.7 kg)    Constitutional: No acute distress Eyes: sclera non-icteric, normal conjunctiva and lids ENMT: normal dentition, moist mucous membranes Cardiovascular: regular rhythm, normal rate, no murmurs. S1 and S2  normal. Radial pulses normal bilaterally. No jugular venous distention.  Respiratory: clear to auscultation bilaterally GI : normal bowel sounds, soft and nontender. No distention.   MSK: extremities warm, well perfused. No edema.  NEURO: grossly nonfocal exam, moves all extremities. PSYCH: alert and oriented x 3, normal mood and affect.   ASSESSMENT:    No diagnosis found. PLAN:    No diagnosis found.  Total time of encounter: *** minutes total time of encounter, including *** minutes spent in face-to-face patient care on the date of this encounter. This time includes coordination of care and counseling regarding above mentioned problem list. Remainder of non-face-to-face time involved reviewing chart  documents/testing relevant to the patient encounter and documentation in the medical record. I have independently reviewed documentation from referring provider.   Cherlynn Kaiser, MD [place]  [facility]    Medication Adjustments/Labs and Tests Ordered: Current medicines are reviewed at length with the patient today.  Concerns regarding medicines are outlined above.   No orders of the defined types were placed in this encounter.   No orders of the defined types were placed in this encounter.   There are no Patient Instructions on file for this visit.

## 2020-04-11 ENCOUNTER — Encounter: Payer: Self-pay | Admitting: Family Medicine

## 2020-04-13 ENCOUNTER — Telehealth: Payer: Self-pay

## 2020-04-13 ENCOUNTER — Other Ambulatory Visit (HOSPITAL_COMMUNITY): Payer: Self-pay | Admitting: Nurse Practitioner

## 2020-04-13 DIAGNOSIS — Z85038 Personal history of other malignant neoplasm of large intestine: Secondary | ICD-10-CM

## 2020-04-13 DIAGNOSIS — U071 COVID-19: Secondary | ICD-10-CM

## 2020-04-13 NOTE — Progress Notes (Signed)
I connected by phone with Jacqueline Garza on 04/13/2020 at 4:30 PM to discuss the potential use of a new treatment for mild to moderate COVID-19 viral infection in non-hospitalized patients.  This patient is a 59 y.o. female that meets the FDA criteria for Emergency Use Authorization of COVID monoclonal antibody casirivimab/imdevimab or bamlanivimab/eteseviamb.  Has a (+) direct SARS-CoV-2 viral test result  Has mild or moderate COVID-19   Is NOT hospitalized due to COVID-19  Is within 10 days of symptom onset  Has at least one of the high risk factor(s) for progression to severe COVID-19 and/or hospitalization as defined in EUA.  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 FDA has authorized the emergency use for the treatment of mild to moderate COVID-19 in adults and pediatric patients with positive results of direct SARS-CoV-2 viral testing who are 63 years of age and older weighing at least 40 kg, and who are at high risk for progressing to severe COVID-19 and/or hospitalization.  2. The significant known and potential risks and benefits of COVID monoclonal antibody, and the extent to which such potential risks and benefits are unknown.  3. Information on available alternative treatments and the risks and benefits of those alternatives, including clinical trials.  4. Patients treated with COVID monoclonal antibody should continue to self-isolate and use infection control measures (e.g., wear mask, isolate, social distance, avoid sharing personal items, clean and disinfect "high touch" surfaces, and frequent handwashing) according to CDC guidelines.   5. The patient or parent/caregiver has the option to accept or refuse COVID monoclonal antibody treatment.  After  reviewing this information with the patient, the patient has agreed to receive one of the available covid 19 monoclonal antibodies and will be provided an appropriate fact sheet prior to infusion. [name], NP 04/13/2020 4:30 PM

## 2020-04-13 NOTE — Telephone Encounter (Signed)
Noted  

## 2020-04-13 NOTE — Telephone Encounter (Signed)
Pt's husband, Richardson Landry, called to report he and his wife tested positive for covid. A note has been sent for Richardson Landry to provider.  Pt tested positive with home test on 9/20. Pt c/o cough w/ white phlegm, chest hurting when coughing, O2 sat 95%, fatigue and loss of appetite with nausea. Pt denies any other symptoms and denies fever.

## 2020-04-13 NOTE — Telephone Encounter (Signed)
Note sent to staff to see if she will be a candidate for infusion.  Thanks.

## 2020-04-13 NOTE — Telephone Encounter (Signed)
Has been scheduled for mAb treatment for Saturday.

## 2020-04-14 ENCOUNTER — Other Ambulatory Visit (HOSPITAL_COMMUNITY): Payer: Self-pay

## 2020-04-14 ENCOUNTER — Ambulatory Visit (HOSPITAL_COMMUNITY)
Admission: RE | Admit: 2020-04-14 | Discharge: 2020-04-14 | Disposition: A | Discharge status: Home / Self Care | Payer: BC Managed Care – PPO | Source: Ambulatory Visit | Attending: Pulmonary Disease | Admitting: Pulmonary Disease

## 2020-04-14 DIAGNOSIS — U071 COVID-19: Secondary | ICD-10-CM

## 2020-04-14 DIAGNOSIS — Z85038 Personal history of other malignant neoplasm of large intestine: Secondary | ICD-10-CM

## 2020-04-14 DIAGNOSIS — R0902 Hypoxemia: Secondary | ICD-10-CM | POA: Diagnosis not present

## 2020-04-14 MED ORDER — SODIUM CHLORIDE 0.9 % IV SOLN: Freq: Once | INTRAVENOUS | Status: AC

## 2020-04-14 MED ORDER — DIPHENHYDRAMINE HCL 50 MG/ML IJ SOLN: 50.0000 mg | Freq: Once | INTRAMUSCULAR | Status: DC | PRN

## 2020-04-14 MED ORDER — SODIUM CHLORIDE 0.9 % IV SOLN
1200.0000 mg | Freq: Once | INTRAVENOUS | Status: AC
  Administered 2020-04-14: 1200 mg via INTRAVENOUS

## 2020-04-14 MED ORDER — FAMOTIDINE IN NACL 20-0.9 MG/50ML-% IV SOLN: 20.0000 mg | Freq: Once | INTRAVENOUS | Status: DC | PRN

## 2020-04-14 MED ORDER — EPINEPHRINE 0.3 MG/0.3ML IJ SOAJ: 0.3000 mg | Freq: Once | INTRAMUSCULAR | Status: DC | PRN

## 2020-04-14 MED ORDER — ALBUTEROL SULFATE HFA 108 (90 BASE) MCG/ACT IN AERS: 2.0000 | INHALATION_SPRAY | Freq: Once | RESPIRATORY_TRACT | Status: DC | PRN

## 2020-04-14 MED ORDER — METHYLPREDNISOLONE SODIUM SUCC 125 MG IJ SOLR: 125.0000 mg | Freq: Once | INTRAMUSCULAR | Status: DC | PRN

## 2020-04-14 MED ORDER — SODIUM CHLORIDE 0.9 % IV SOLN: INTRAVENOUS | Status: DC | PRN

## 2020-04-14 NOTE — Discharge Instructions (Signed)

## 2020-04-14 NOTE — Progress Notes (Signed)
  Diagnosis: COVID-19  Physician: Dr. Asencion Noble  Procedure: Covid Infusion Clinic Med: casirivimab\imdevimab infusion - Provided patient with casirivimab\imdevimab fact sheet for patients, parents and caregivers prior to infusion.  Complications: BP low post infusion, 99/46 (63), Jacqueline Phlegm, NP notified. 1L bolus ordered and infusing. [name] patient. BP rechecked and WNL, 107/56, pt discharged home.  Discharge: Discharged home   Janine Ores 04/14/2020

## 2020-04-15 ENCOUNTER — Other Ambulatory Visit: Payer: Self-pay

## 2020-04-15 ENCOUNTER — Emergency Department (HOSPITAL_COMMUNITY): Payer: BC Managed Care – PPO

## 2020-04-15 ENCOUNTER — Encounter (HOSPITAL_COMMUNITY): Payer: Self-pay | Admitting: Emergency Medicine

## 2020-04-15 ENCOUNTER — Inpatient Hospital Stay (HOSPITAL_COMMUNITY)
Admission: EM | Admit: 2020-04-15 | Discharge: 2020-04-18 | DRG: 177 | Disposition: A | Discharge status: Home / Self Care | Payer: BC Managed Care – PPO | Attending: Internal Medicine | Admitting: Internal Medicine

## 2020-04-15 DIAGNOSIS — Z79899 Other long term (current) drug therapy: Secondary | ICD-10-CM

## 2020-04-15 DIAGNOSIS — J1282 Pneumonia due to coronavirus disease 2019: Secondary | ICD-10-CM | POA: Diagnosis present

## 2020-04-15 DIAGNOSIS — Z888 Allergy status to other drugs, medicaments and biological substances status: Secondary | ICD-10-CM | POA: Diagnosis not present

## 2020-04-15 DIAGNOSIS — M069 Rheumatoid arthritis, unspecified: Secondary | ICD-10-CM | POA: Diagnosis present

## 2020-04-15 DIAGNOSIS — Z85828 Personal history of other malignant neoplasm of skin: Secondary | ICD-10-CM | POA: Diagnosis not present

## 2020-04-15 DIAGNOSIS — J9601 Acute respiratory failure with hypoxia: Secondary | ICD-10-CM | POA: Diagnosis not present

## 2020-04-15 DIAGNOSIS — K219 Gastro-esophageal reflux disease without esophagitis: Secondary | ICD-10-CM | POA: Diagnosis present

## 2020-04-15 DIAGNOSIS — Z85038 Personal history of other malignant neoplasm of large intestine: Secondary | ICD-10-CM | POA: Diagnosis not present

## 2020-04-15 DIAGNOSIS — J9600 Acute respiratory failure, unspecified whether with hypoxia or hypercapnia: Secondary | ICD-10-CM

## 2020-04-15 DIAGNOSIS — U071 COVID-19: Principal | ICD-10-CM | POA: Diagnosis present

## 2020-04-15 DIAGNOSIS — E785 Hyperlipidemia, unspecified: Secondary | ICD-10-CM | POA: Diagnosis present

## 2020-04-15 DIAGNOSIS — R0902 Hypoxemia: Secondary | ICD-10-CM | POA: Diagnosis present

## 2020-04-15 DIAGNOSIS — F329 Major depressive disorder, single episode, unspecified: Secondary | ICD-10-CM | POA: Diagnosis present

## 2020-04-15 DIAGNOSIS — Z806 Family history of leukemia: Secondary | ICD-10-CM

## 2020-04-15 DIAGNOSIS — Z882 Allergy status to sulfonamides status: Secondary | ICD-10-CM

## 2020-04-15 DIAGNOSIS — Z803 Family history of malignant neoplasm of breast: Secondary | ICD-10-CM | POA: Diagnosis not present

## 2020-04-15 DIAGNOSIS — Z8249 Family history of ischemic heart disease and other diseases of the circulatory system: Secondary | ICD-10-CM

## 2020-04-15 LAB — COMPREHENSIVE METABOLIC PANEL
ALT: 84 U/L — ABNORMAL HIGH (ref 0–44)
AST: 72 U/L — ABNORMAL HIGH (ref 15–41)
Albumin: 3 g/dL — ABNORMAL LOW (ref 3.5–5.0)
Alkaline Phosphatase: 45 U/L (ref 38–126)
Anion gap: 12 (ref 5–15)
BUN: 10 mg/dL (ref 6–20)
CO2: 23 mmol/L (ref 22–32)
Calcium: 8.7 mg/dL — ABNORMAL LOW (ref 8.9–10.3)
Chloride: 103 mmol/L (ref 98–111)
Creatinine, Ser: 0.9 mg/dL (ref 0.44–1.00)
GFR calc Af Amer: >60 mL/min (ref >60)
GFR calc non Af Amer: >60 mL/min (ref >60)
Glucose, Bld: 100 mg/dL — ABNORMAL HIGH (ref 70–99)
Potassium: 4 mmol/L (ref 3.5–5.1)
Sodium: 138 mmol/L (ref 135–145)
Total Bilirubin: 0.7 mg/dL (ref 0.3–1.2)
Total Protein: 6.9 g/dL (ref 6.5–8.1)

## 2020-04-15 LAB — CREATININE, SERUM
Creatinine, Ser: 0.9 mg/dL (ref 0.44–1.00)
GFR calc Af Amer: >60 mL/min (ref >60)
GFR calc non Af Amer: >60 mL/min (ref >60)

## 2020-04-15 LAB — CBC WITH DIFFERENTIAL/PLATELET
Abs Immature Granulocytes: 0.02 10*3/uL (ref 0.00–0.07)
Basophils Absolute: 0 10*3/uL (ref 0.0–0.1)
Basophils Relative: 0 %
Eosinophils Absolute: 0.1 10*3/uL (ref 0.0–0.5)
Eosinophils Relative: 2 %
HCT: 39.1 % (ref 36.0–46.0)
Hemoglobin: 12.5 g/dL (ref 12.0–15.0)
Immature Granulocytes: 1 %
Lymphocytes Relative: 23 %
Lymphs Abs: 0.8 10*3/uL (ref 0.7–4.0)
MCH: 27.2 pg (ref 26.0–34.0)
MCHC: 32 g/dL (ref 30.0–36.0)
MCV: 85.2 fL (ref 80.0–100.0)
Monocytes Absolute: 0.4 10*3/uL (ref 0.1–1.0)
Monocytes Relative: 10 %
Neutro Abs: 2.3 10*3/uL (ref 1.7–7.7)
Neutrophils Relative %: 64 %
Platelets: 253 10*3/uL (ref 150–400)
RBC: 4.59 MIL/uL (ref 3.87–5.11)
RDW: 13 % (ref 11.5–15.5)
WBC: 3.5 10*3/uL — ABNORMAL LOW (ref 4.0–10.5)
nRBC: 0 % (ref 0.0–0.2)

## 2020-04-15 LAB — CBC
HCT: 33 % — ABNORMAL LOW (ref 36.0–46.0)
Hemoglobin: 10.4 g/dL — ABNORMAL LOW (ref 12.0–15.0)
MCH: 26.7 pg (ref 26.0–34.0)
MCHC: 31.5 g/dL (ref 30.0–36.0)
MCV: 84.8 fL (ref 80.0–100.0)
Platelets: 234 10*3/uL (ref 150–400)
RBC: 3.89 MIL/uL (ref 3.87–5.11)
RDW: 13.1 % (ref 11.5–15.5)
WBC: 3.2 10*3/uL — ABNORMAL LOW (ref 4.0–10.5)
nRBC: 0 % (ref 0.0–0.2)

## 2020-04-15 LAB — LACTIC ACID, PLASMA
Lactic Acid, Venous: 1.9 mmol/L (ref 0.5–1.9)
Lactic Acid, Venous: 1.7 mmol/L (ref 0.5–1.9)

## 2020-04-15 LAB — I-STAT BETA HCG BLOOD, ED (MC, WL, AP ONLY): I-stat hCG, quantitative: 17.8 m[IU]/mL — ABNORMAL HIGH (ref <5)

## 2020-04-15 LAB — FERRITIN: Ferritin: 417 ng/mL — ABNORMAL HIGH (ref 11–307)

## 2020-04-15 LAB — FIBRINOGEN: Fibrinogen: 625 mg/dL — ABNORMAL HIGH (ref 210–475)

## 2020-04-15 LAB — C-REACTIVE PROTEIN: CRP: 6 mg/dL — ABNORMAL HIGH (ref <1.0)

## 2020-04-15 LAB — LACTATE DEHYDROGENASE: LDH: 313 U/L — ABNORMAL HIGH (ref 98–192)

## 2020-04-15 LAB — PROCALCITONIN: Procalcitonin: <0.1 ng/mL

## 2020-04-15 LAB — TRIGLYCERIDES: Triglycerides: 167 mg/dL — ABNORMAL HIGH (ref <150)

## 2020-04-15 LAB — D-DIMER, QUANTITATIVE: D-Dimer, Quant: 1.27 ug/mL-FEU — ABNORMAL HIGH (ref 0.00–0.50)

## 2020-04-15 MED ORDER — ALBUTEROL SULFATE HFA 108 (90 BASE) MCG/ACT IN AERS
2.0000 | INHALATION_SPRAY | Freq: Once | RESPIRATORY_TRACT | Status: AC
  Administered 2020-04-15: 2 via RESPIRATORY_TRACT
  Filled 2020-04-15: qty 6.7

## 2020-04-15 MED ORDER — METHYLPREDNISOLONE SODIUM SUCC 125 MG IJ SOLR
60.0000 mg | Freq: Once | INTRAMUSCULAR | Status: DC
  Administered 2020-04-15: 35 mg via INTRAVENOUS
  Filled 2020-04-15: qty 2

## 2020-04-15 MED ORDER — METHYLPREDNISOLONE SODIUM SUCC 40 MG IJ SOLR
35.0000 mg | Freq: Once | INTRAMUSCULAR | Status: AC
  Administered 2020-04-15: 35 mg via INTRAVENOUS

## 2020-04-15 MED ORDER — DULOXETINE HCL 60 MG PO CPEP
90.0000 mg | ORAL_CAPSULE | Freq: Every day | ORAL | Status: DC
  Administered 2020-04-16 – 2020-04-18 (×3): 90 mg via ORAL
  Filled 2020-04-15 (×2): qty 1
  Filled 2020-04-15: qty 3

## 2020-04-15 MED ORDER — ONDANSETRON HCL 4 MG PO TABS: 4.0000 mg | ORAL_TABLET | Freq: Four times a day (QID) | ORAL | Status: DC | PRN

## 2020-04-15 MED ORDER — ACETAMINOPHEN 325 MG PO TABS: 650.0000 mg | ORAL_TABLET | Freq: Four times a day (QID) | ORAL | Status: DC | PRN

## 2020-04-15 MED ORDER — ACETAMINOPHEN 650 MG RE SUPP: 650.0000 mg | Freq: Four times a day (QID) | RECTAL | Status: DC | PRN

## 2020-04-15 MED ORDER — ENOXAPARIN SODIUM 40 MG/0.4ML ~~LOC~~ SOLN
40.0000 mg | SUBCUTANEOUS | Status: DC
  Administered 2020-04-15 – 2020-04-17 (×3): 40 mg via SUBCUTANEOUS
  Filled 2020-04-15 (×3): qty 0.4

## 2020-04-15 MED ORDER — ONDANSETRON HCL 4 MG/2ML IJ SOLN: 4.0000 mg | Freq: Four times a day (QID) | INTRAMUSCULAR | Status: DC | PRN

## 2020-04-15 MED ORDER — DULOXETINE HCL 60 MG PO CPEP: 60.0000 mg | ORAL_CAPSULE | ORAL | Status: DC

## 2020-04-15 NOTE — ED Provider Notes (Signed)
[facility] EMERGENCY DEPARTMENT Provider Note   CSN: [id number] Arrival date & time: 04/15/20  1317     History Chief Complaint  Patient presents with  . Covid Positive  . Cough    Jacqueline Garza is a 59 y.o. female presenting for evaluation of cough and shortness of breath.  Patient states she developed symptoms of Covid approximately 10 days ago.  She tested positive last week.  She reports continued significant cough and shortness of breath with exertion.  This has not been worsening, however also not improving.  She had a monoclonal antibody infusion yesterday, no improvement of symptoms.  She was encouraged to be evaluated due to her persistent shortness of breath with exertion.  She reports no fevers, chest pain, nausea, vomiting, abdominal pain, urinary symptoms, normal bowel movements.  She denies lightheadedness or dizziness.  She did not receive the Covid vaccine.  She reports no medical problems, states she takes no medications daily.  She has been taking Delsym for her symptoms, has not been taking anything else.  HPI     Past Medical History:  Diagnosis Date  . 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 Basal cell carcinoma (BCC) of face   . Cancer ([place])    basal cell carcinoma / facial area and back / right leg   . Colon cancer ([place])   . Depression   . Dysplastic nevus of left lower extremity 11/06/2011  . Edema    with occ HCTZ use  . GERD (gastroesophageal reflux disease)   . History of colon cancer   . History of hiatal hernia   . Rheumatoid arthritis(714.0)    with prev MTX intolerance; per [facility]  . Wears glasses     Patient Active Problem List   Diagnosis Date Noted  . Other chest pain 01/11/2019  . HLD (hyperlipidemia) 01/25/2017  . History of colon cancer 09/03/2015  . S/P colectomy 08/14/2015  . Pain, low back 09/18/2014  . Cough 09/18/2014  . Advance care planning 01/23/2014  . Routine general medical  examination at a health care facility 01/23/2014  . Anxiety state, unspecified 08/30/2013  . 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 Inflammatory arthritis 10/18/2010  . JOINT EFFUSION, KNEE 01/26/2009  . Depression 08/03/2008  . SLEEPLESSNESS 08/03/2008  . ADVEF, DRUG/MED/BIOL SUBST, OTHER DRUG NOS 05/18/2007    Past Surgical History:  Procedure Laterality Date  . CESAREAN SECTION    . COLON SURGERY    . COLONOSCOPY    . colonscopy      removed polyps  . LAPAROSCOPIC SIGMOID COLECTOMY N/A 08/14/2015   Procedure: LAPAROSCOPIC ENTEROLYSIS AND MOBILIZATION OF SPLENIC FLEXURE; SIGMOID COLECTOMY WITH PRIMARY HAND SEWN ANASTOMOSIS;  ENDING LAPAROSCOPY AND INJECTION OF EXPAREL;  Surgeon: Johnathan Hausen, MD;  Location: [facility];  Service: General;  Laterality: N/A;  . TUBAL LIGATION       OB History   No obstetric history on file.     Family History  Problem Relation Age of Onset  . Hypertension Father   . Leukemia Sister   . Breast cancer Cousin   . Colon cancer Neg Hx   . Esophageal cancer Neg Hx   . Rectal cancer Neg Hx   . Stomach cancer Neg Hx     Social History   Tobacco Use  . Smoking status: Never Smoker  . Smokeless tobacco: Never Used  Substance Use Topics  . Alcohol use: Yes  Alcohol/week: 0.0 standard drinks    Comment: rarely   . Drug use: No    Home Medications Prior to Admission medications   Medication Sig Start Date End Date Taking? Authorizing Provider  clobetasol cream (TEMOVATE) 3.41 % Apply 1 application topically daily.    [provider]  conjugated estrogens (PREMARIN) vaginal cream Place vaginally 2 (two) times a week. 07/27/17   Princess Bruins, MD  DULoxetine (CYMBALTA) 30 MG capsule TAKE 1 CAP A DAY WITH 60MG  FOR 90MG  TOTAL.Please make an appointment for physical exam prior to further refills. 03/15/20   Tonia Ghent, MD  DULoxetine (CYMBALTA) 60 MG capsule Take 1 a day with 30mg  for 90mg  total.  Please make an  appointment for physical exam prior to further refills. 03/15/20   Tonia Ghent, MD    Allergies    Hydroxychloroquine, Methotrexate derivatives, Sulfa antibiotics, and Tessalon [benzonatate]  Review of Systems   Review of Systems  Respiratory: Positive for cough and shortness of breath.   All other systems reviewed and are negative.   Physical Exam Updated Vital Signs BP (!) 93/47 (BP Location: Right Arm)   Pulse 79   Temp 97.7 F (36.5 C) (Oral)   Resp 16   LMP 08/19/2010   SpO2 96%   Physical Exam Vitals and nursing note reviewed.  Constitutional:      General: She is not in acute distress.    Appearance: She is well-developed.     Comments: Appears nontoxic  HENT:     Head: Normocephalic and atraumatic.  Eyes:     Extraocular Movements: Extraocular movements intact.     Conjunctiva/sclera: Conjunctivae normal.     Pupils: Pupils are equal, round, and reactive to light.  Cardiovascular:     Rate and Rhythm: Normal rate and regular rhythm.     Pulses: Normal pulses.  Pulmonary:     Effort: Pulmonary effort is normal. No respiratory distress.     Breath sounds: Rales present. No wheezing.     Comments: Speaking in full sentences.  Rales in bilateral lower lobes.  Sats between 89 and 92% at rest.  With ambulation, sats dropped down to 86% at the lowest.  Patient appears mildly tachypneic with exertion. Abdominal:     General: There is no distension.     Palpations: Abdomen is soft. There is no mass.     Tenderness: There is no abdominal tenderness. There is no guarding or rebound.  Musculoskeletal:        General: Normal range of motion.     Cervical back: Normal range of motion and neck supple.     Right lower leg: No edema.     Left lower leg: No edema.  Skin:    General: Skin is warm and dry.     Capillary Refill: Capillary refill takes less than 2 seconds.  Neurological:     Mental Status: She is alert and oriented to person, place, and time.     ED  Results / Procedures / Treatments   Labs (all labs ordered are listed, but only abnormal results are displayed) Labs Reviewed  CULTURE, BLOOD (ROUTINE X 2)  CULTURE, BLOOD (ROUTINE X 2)  LACTIC ACID, PLASMA  LACTIC ACID, PLASMA  CBC WITH DIFFERENTIAL/PLATELET  COMPREHENSIVE METABOLIC PANEL  D-DIMER, QUANTITATIVE (NOT AT [facility])  PROCALCITONIN  LACTATE DEHYDROGENASE  FERRITIN  TRIGLYCERIDES  FIBRINOGEN  C-REACTIVE PROTEIN  I-STAT BETA HCG BLOOD, ED ([facility], [facility], [facility] ONLY)    EKG EKG Interpretation  Date/Time:  Sunday April 15 2020 13:21:19 EDT Ventricular Rate:  84 PR Interval:  146 QRS Duration: 88 QT Interval:  358 QTC Calculation: 423 R Axis:   57 Text Interpretation: Normal sinus rhythm Cannot rule out Anterior infarct , age undetermined Abnormal ECG No significant change since last tracing Confirmed by Calvert Cantor [phone]) on 04/15/2020 4:43:09 PM   Radiology DG Chest Portable 1 View  Result Date: 04/15/2020 CLINICAL DATA:  Cough and shortness of breath. COVID-19 virus infection. EXAM: PORTABLE CHEST 1 VIEW COMPARISON:  None. FINDINGS: The heart size and mediastinal contours are within normal limits. Mild airspace disease seen in both lung bases. No evidence of pleural effusion. IMPRESSION: Mild bibasilar airspace disease, suspicious for viral pneumonia. Electronically Signed   By: Marlaine Hind M.D.   On: 04/15/2020 14:50    Procedures Procedures (including critical care time)  Medications Ordered in ED Medications - No data to display  ED Course  I have reviewed the triage vital signs and the nursing notes.  Pertinent labs & imaging results that were available during my care of the patient were reviewed by me and considered in my medical decision making (see chart for details).    MDM Rules/Calculators/A&P                          Patient presented for evaluation of cough and shortness of breath in the setting of a Covid infection.  On exam, patient initially  appears nontoxic.  However her oxygen sats are low at rest, and worsen with exertion.  Patient also appears mildly tachypneic with exertion.  X-ray obtained from triage read interpreted by me, shows bibasilar opacities consistent with viral pneumonia.  Discussed findings with patient.  Discussed in the setting of Covid and hypoxia, recommend admission.  Will obtain labs including inflammatory markers prior to admission.  Albuterol and Solu-Medrol given.   Discussed with Dr. Hal Hope from triad hospitalist service, pt to be admitted. Requesting 0.5 mg/kg solumedrol.   Final Clinical Impression(s) / ED Diagnoses Final diagnoses:  COVID-19  Pneumonia due to COVID-19 virus  Hypoxia    Rx / DC Orders ED Discharge Orders    None       Franchot Heidelberg, PA-C 04/15/20 2043    Truddie Hidden, MD 04/15/20 2123

## 2020-04-15 NOTE — ED Notes (Signed)
HAVING DIFFICULTY GETTING THE PTS BLOOD AND GETTING AN IV

## 2020-04-15 NOTE — ED Notes (Addendum)
Unable to get cultures.

## 2020-04-15 NOTE — H&P (Signed)
History and Physical    Jacqueline Garza MWN:[id number] DOB: [birth year] DOA: 04/15/2020  PCP: Tonia Ghent, MD  Patient coming from: Home.  Chief Complaint: Shortness of breath.  HPI: Jacqueline Garza is a 59 y.o. female with history of colon cancer status post hemicolectomy and in remission has been experiencing shortness of breath with cough over the last 1 week. Patient states her husband was diagnosed with Covid infection last week and she tested positive for Covid with her home kit. She did get monoclonal antibody infusion yesterday. Despite which patient was getting more short of breath and coughing decided to come to the ER. Denies any nausea vomiting diarrhea fever or chills.  ED Course: In the ER patient was hypoxic requiring 2 L to maintain sats more than 98 with chest x-ray showing infiltrates CRP was 6 D-dimer 1.27 patient was started on steroids and remdesivir admitted for COVID-19 infection with hypoxia.  Review of Systems: As per HPI, rest all negative.   Past Medical History:  Diagnosis Date  . 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 Basal cell carcinoma (BCC) of face   . Cancer ([place])    basal cell carcinoma / facial area and back / right leg   . Colon cancer ([place])   . Depression   . Dysplastic nevus of left lower extremity 11/06/2011  . Edema    with occ HCTZ use  . GERD (gastroesophageal reflux disease)   . History of colon cancer   . History of hiatal hernia   . Rheumatoid arthritis(714.0)    with prev MTX intolerance; per [facility]. [facility]  . Wears glasses     Past Surgical History:  Procedure Laterality Date  . CESAREAN SECTION    . COLON SURGERY    . COLONOSCOPY    . colonscopy      removed polyps  . LAPAROSCOPIC SIGMOID COLECTOMY N/A 08/14/2015   Procedure: LAPAROSCOPIC ENTEROLYSIS AND MOBILIZATION OF SPLENIC FLEXURE; SIGMOID COLECTOMY WITH PRIMARY HAND SEWN ANASTOMOSIS;  ENDING LAPAROSCOPY AND INJECTION OF EXPAREL;  Surgeon: Johnathan Hausen, MD;  Location: [facility];  Service: General;  Laterality: N/A;  . TUBAL LIGATION       reports that she has never smoked. She has never used smokeless tobacco. She reports current alcohol use. She reports that she does not use drugs.  Allergies  Allergen Reactions  . Hydroxychloroquine Itching  . Methotrexate Derivatives Other (See Comments)    Elevated liver test  . Sulfa Antibiotics Itching  . Tessalon [Benzonatate] Other (See Comments)    Lack of effect.     Family History  Problem Relation Age of Onset  . Hypertension Father   . Leukemia Sister   . Breast cancer Cousin   . Colon cancer Neg Hx   . Esophageal cancer Neg Hx   . Rectal cancer Neg Hx   . Stomach cancer Neg Hx     Prior to Admission medications   Medication Sig Start Date End Date Taking? Authorizing Provider  DULoxetine (CYMBALTA) 30 MG capsule TAKE 1 CAP A DAY WITH 60MG FOR 90MG TOTAL.Please make an appointment for physical exam prior to further refills. Patient taking differently: Take 30 mg by mouth See admin instructions. Take 1 capsule (30 mg) combine with 1 capsule (60 mg) to make (90 mg totally) by mouth every day 03/15/20  Yes Tonia Ghent, MD  DULoxetine (CYMBALTA) 60 MG capsule Take 1 a day with 52m for 935m  total.  Please make an appointment for physical exam prior to further refills. Patient taking differently: Take 60 mg by mouth See admin instructions. Take 1 capsule (60 mg) combine with 1 capsule (30 mg) to make (90 mg totally) by mouth every day 03/15/20  Yes Tonia Ghent, MD  conjugated estrogens (PREMARIN) vaginal cream Place vaginally 2 (two) times a week. Patient not taking: Reported on 04/15/2020 07/27/17   Princess Bruins, MD    Physical Exam: Constitutional: Moderately built and nourished. Vitals:   04/15/20 1327 04/15/20 1527  BP: (!) 101/56 (!) 93/47  Pulse: 81 79  Resp: 18 16  Temp: 97.7 F (36.5 C)   TempSrc: Oral   SpO2: 95% 96%   Eyes: Anicteric no  pallor. ENMT: No discharge from the ears eyes nose or mouth. Neck: No mass felt. No neck rigidity. Respiratory: No rhonchi or crepitations. Cardiovascular: S1-S2 heard. Abdomen: Soft nontender bowel sounds present. Musculoskeletal: No edema. Skin: No rash. Neurologic: Alert awake oriented to time place and person. Moves all extremities. Psychiatric: Appears normal. Normal affect.   Labs on Admission: I have personally reviewed following labs and imaging studies  CBC: Recent Labs  Lab 04/15/20 1741  WBC 3.5*  NEUTROABS 2.3  HGB 12.5  HCT 39.1  MCV 85.2  PLT 283   Basic Metabolic Panel: Recent Labs  Lab 04/15/20 1741  NA 138  K 4.0  CL 103  CO2 23  GLUCOSE 100*  BUN 10  CREATININE 0.90  CALCIUM 8.7*   GFR: CrCl cannot be calculated (Unknown ideal weight.). Liver Function Tests: Recent Labs  Lab 04/15/20 1741  AST 72*  ALT 84*  ALKPHOS 45  BILITOT 0.7  PROT 6.9  ALBUMIN 3.0*   No results for input(s): LIPASE, AMYLASE in the last 168 hours. No results for input(s): AMMONIA in the last 168 hours. Coagulation Profile: No results for input(s): INR, PROTIME in the last 168 hours. Cardiac Enzymes: No results for input(s): CKTOTAL, CKMB, CKMBINDEX, TROPONINI in the last 168 hours. BNP (last 3 results) No results for input(s): PROBNP in the last 8760 hours. HbA1C: No results for input(s): HGBA1C in the last 72 hours. CBG: No results for input(s): GLUCAP in the last 168 hours. Lipid Profile: Recent Labs    04/15/20 1741  TRIG 167*   Thyroid Function Tests: No results for input(s): TSH, T4TOTAL, FREET4, T3FREE, THYROIDAB in the last 72 hours. Anemia Panel: Recent Labs    04/15/20 1741  FERRITIN 417*   Urine analysis: No results found for: COLORURINE, APPEARANCEUR, LABSPEC, PHURINE, GLUCOSEU, HGBUR, BILIRUBINUR, KETONESUR, PROTEINUR, UROBILINOGEN, NITRITE, LEUKOCYTESUR Sepsis Labs: '@LABRCNTIP' (procalcitonin:4,lacticidven:4) )No results found for this  or any previous visit (from the past 240 hour(s)).   Radiological Exams on Admission: DG Chest Portable 1 View  Result Date: 04/15/2020 CLINICAL DATA:  Cough and shortness of breath. COVID-19 virus infection. EXAM: PORTABLE CHEST 1 VIEW COMPARISON:  None. FINDINGS: The heart size and mediastinal contours are within normal limits. Mild airspace disease seen in both lung bases. No evidence of pleural effusion. IMPRESSION: Mild bibasilar airspace disease, suspicious for viral pneumonia. Electronically Signed   By: Marlaine Hind M.D.   On: 04/15/2020 14:50    EKG: Independently reviewed. Normal sinus rhythm.  Assessment/Plan Principal Problem:   Acute respiratory failure due to COVID-19 [facility]) Active Problems:   History of colon cancer   Acute respiratory disease due to COVID-19 virus    1. Acute respiratory failure secondary to COVID-19 infection on IV remdesivir and Solu-Medrol. Closely  monitor respiratory status and inflammatory markers. I did discuss with patient about the off label use side effect and contraindication about baricitinib. Patient agrees to get it if required. 2. History of depression on Cymbalta. 3. History of colon cancer in remission.  Since patient has acute respiratory failure secondary to COVID-19 infection patient will need close monitoring for any further worsening in inpatient status.   DVT prophylaxis: Lovenox. Code Status: Full code. Family Communication: Discussed with patient. Disposition Plan: Home. Consults called: None. Admission status: Inpatient.   Rise Patience MD [facility] Pager [phone].  If 7PM-7AM, please contact night-coverage [email or web address] Password [facility]  04/15/2020, 9:51 PM

## 2020-04-15 NOTE — ED Notes (Signed)
Initial order was solu,edrol 60 mg iv  Drew up the med  When I checked just as I gave it the order had changed  Being 35,g iv out of 40   Only the 35mg  from 125 was given  Unable to get it changed

## 2020-04-15 NOTE — ED Notes (Signed)
Blood drawn food and water given  Pt alert no distress

## 2020-04-15 NOTE — ED Triage Notes (Signed)
Pt states she tested + for COVID 1 week ago and continues to have a cough and sometimes has SOB with exertion.

## 2020-04-15 NOTE — ED Notes (Signed)
The pt hs been ill for about one week test3d pos for covid  Husband had it firtst both she and her husband have not had the vaccines  They had the infusion yesterday at [facility]  And she reports that she feel better today  No temp sats 995 ON 1.5 LITERS OF NANAL O2

## 2020-04-16 ENCOUNTER — Encounter (HOSPITAL_COMMUNITY): Payer: Self-pay | Admitting: Internal Medicine

## 2020-04-16 DIAGNOSIS — J9601 Acute respiratory failure with hypoxia: Secondary | ICD-10-CM

## 2020-04-16 DIAGNOSIS — J1282 Pneumonia due to coronavirus disease 2019: Secondary | ICD-10-CM

## 2020-04-16 LAB — RESPIRATORY PANEL BY RT PCR (FLU A&B, COVID)
Influenza A by PCR: NEGATIVE
Influenza B by PCR: NEGATIVE
SARS Coronavirus 2 by RT PCR: POSITIVE — AB

## 2020-04-16 LAB — HIV ANTIBODY (ROUTINE TESTING W REFLEX): HIV Screen 4th Generation wRfx: NONREACTIVE

## 2020-04-16 MED ORDER — METHYLPREDNISOLONE SODIUM SUCC 40 MG IJ SOLR
35.0000 mg | Freq: Two times a day (BID) | INTRAMUSCULAR | Status: DC
  Administered 2020-04-16 (×2): 35 mg via INTRAVENOUS
  Filled 2020-04-16 (×2): qty 1

## 2020-04-16 MED ORDER — ASCORBIC ACID 500 MG PO TABS
500.0000 mg | ORAL_TABLET | Freq: Every day | ORAL | Status: DC
  Administered 2020-04-16 – 2020-04-18 (×3): 500 mg via ORAL
  Filled 2020-04-16 (×3): qty 1

## 2020-04-16 MED ORDER — ZINC SULFATE 220 (50 ZN) MG PO CAPS
220.0000 mg | ORAL_CAPSULE | Freq: Every day | ORAL | Status: DC
  Administered 2020-04-16 – 2020-04-18 (×3): 220 mg via ORAL
  Filled 2020-04-16 (×3): qty 1

## 2020-04-16 MED ORDER — PREDNISONE 20 MG PO TABS: 50.0000 mg | ORAL_TABLET | Freq: Every day | ORAL | Status: DC

## 2020-04-16 MED ORDER — SODIUM CHLORIDE 0.9 % IV SOLN
200.0000 mg | Freq: Once | INTRAVENOUS | Status: AC
  Administered 2020-04-16: 200 mg via INTRAVENOUS
  Filled 2020-04-16: qty 40

## 2020-04-16 MED ORDER — GUAIFENESIN-DM 100-10 MG/5ML PO SYRP
10.0000 mL | ORAL_SOLUTION | ORAL | Status: DC | PRN
  Administered 2020-04-17 – 2020-04-18 (×4): 10 mL via ORAL
  Filled 2020-04-16 (×4): qty 10

## 2020-04-16 MED ORDER — SODIUM CHLORIDE 0.9 % IV SOLN
100.0000 mg | Freq: Every day | INTRAVENOUS | Status: DC
  Administered 2020-04-17 – 2020-04-18 (×2): 100 mg via INTRAVENOUS
  Filled 2020-04-16 (×2): qty 20

## 2020-04-16 MED ORDER — HYDROCOD POLST-CPM POLST ER 10-8 MG/5ML PO SUER: 5.0000 mL | Freq: Two times a day (BID) | ORAL | Status: DC | PRN

## 2020-04-16 NOTE — ED Notes (Signed)
Eating supper at present.

## 2020-04-16 NOTE — ED Notes (Signed)
Attempted to call report

## 2020-04-16 NOTE — Progress Notes (Signed)
[facility] PROGRESS NOTE    Progress Note  Jacqueline Garza  KGM:[id number] DOB: 09/03/1960 DOA: 04/15/2020 PCP: Tonia Ghent, MD     Brief Narrative:   Jacqueline Garza is an 59 y.o. female past medical history of colon cancer status post hemicolectomy in remission comes in for 1 week of shortness of breath and cough, she relates that her husband was diagnosed with COVID-19 about a week prior to admission.  She did get a monoclonal infusion yesterday despite this she was getting more short of breath and she decided to come to the ED, she was found to be hypoxic placed on 2 L she is x-ray showed bilateral infiltrates CPR was elevated D-dimer 1.7 he was started on IV remdesivir and steroids.  Assessment/Plan:   Acute respiratory failure with hypoxia secondary to Pneumonia due to COVID-19 virus On admission she was requiring 2 L of oxygen to keep saturations greater than 90%, she is now 1 L of oxygen satting 94% we will try to wean to room air. She was started on IV remdesivir and steroids her inflammatory markers are mildly elevated. Try to keep the patient prone for at least 16 hours a day if not prone out of bed to chair. Continue to monitor strict I's and O's and daily weights.  History of colon cancer noted   DVT prophylaxis: lovenxo Family Communication:none Status is: Inpatient  Remains inpatient appropriate because:Hemodynamically unstable   Dispo: The patient is from: Home              Anticipated d/c is to: Home              Anticipated d/c date is: 1 day              Patient currently is not medically stable to d/c.        Code Status:     Code Status Orders  (From admission, onward)         Start     Ordered   04/15/20 2151  Full code  Continuous        04/15/20 2151        Code Status History    Date Active Date Inactive Code Status Order ID Comments User Context   08/14/2015 1520 08/19/2015 1440 Full Code [id number]  Johnathan Hausen, MD  Inpatient   Advance Care Planning Activity        IV Access:    Peripheral IV   Procedures and diagnostic studies:   DG Chest Portable 1 View  Result Date: 04/15/2020 CLINICAL DATA:  Cough and shortness of breath. COVID-19 virus infection. EXAM: PORTABLE CHEST 1 VIEW COMPARISON:  None. FINDINGS: The heart size and mediastinal contours are within normal limits. Mild airspace disease seen in both lung bases. No evidence of pleural effusion. IMPRESSION: Mild bibasilar airspace disease, suspicious for viral pneumonia. Electronically Signed   By: Marlaine Hind M.D.   On: 04/15/2020 14:50     Medical Consultants:    None.  Anti-Infectives:   remdesivir  Subjective:    Jacqueline Garza she relates her breathing is better than when she came in still with a persistent cough.  Objective:    Vitals:   04/16/20 0315 04/16/20 0530 04/16/20 0615 04/16/20 0645  BP: (!) 111/56 122/69 135/62   Pulse: 63 (!) 57 60 (!) 57  Resp:      Temp:      TempSrc:      SpO2: 96% 94% 98% 96%  Weight:      Height:       SpO2: 96 %  No intake or output data in the 24 hours ending 04/16/20 0715 Filed Weights   04/16/20 0100  Weight: 70 kg    Exam: General exam: In no acute distress. Respiratory system: Good air movement with diffuse crackles bilaterally. Cardiovascular system: S1 & S2 heard, RRR. No JVD. Gastrointestinal system: Abdomen is nondistended, soft and nontender.  Extremities: No pedal edema. Skin: No rashes, lesions or ulcers Psychiatry: Judgement and insight appear normal. Mood & affect appropriate.    Data Reviewed:    Labs: Basic Metabolic Panel: Recent Labs  Lab 04/15/20 1741 04/15/20 2300  NA 138  --   K 4.0  --   CL 103  --   CO2 23  --   GLUCOSE 100*  --   BUN 10  --   CREATININE 0.90 0.90  CALCIUM 8.7*  --    GFR Estimated Creatinine Clearance: 59.5 mL/min (by C-G formula based on SCr of 0.9 mg/dL). Liver Function Tests: Recent Labs  Lab  04/15/20 1741  AST 72*  ALT 84*  ALKPHOS 45  BILITOT 0.7  PROT 6.9  ALBUMIN 3.0*   No results for input(s): LIPASE, AMYLASE in the last 168 hours. No results for input(s): AMMONIA in the last 168 hours. Coagulation profile No results for input(s): INR, PROTIME in the last 168 hours. COVID-19 Labs  Recent Labs    04/15/20 1741  DDIMER 1.27*  FERRITIN 417*  LDH 313*  CRP 6.0*    Lab Results  Component Value Date   SARSCOV2NAA POSITIVE (A) 04/15/2020    CBC: Recent Labs  Lab 04/15/20 1741 04/15/20 2300  WBC 3.5* 3.2*  NEUTROABS 2.3  --   HGB 12.5 10.4*  HCT 39.1 33.0*  MCV 85.2 84.8  PLT 253 234   Cardiac Enzymes: No results for input(s): CKTOTAL, CKMB, CKMBINDEX, TROPONINI in the last 168 hours. BNP (last 3 results) No results for input(s): PROBNP in the last 8760 hours. CBG: No results for input(s): GLUCAP in the last 168 hours. D-Dimer: Recent Labs    04/15/20 1741  DDIMER 1.27*   Hgb A1c: No results for input(s): HGBA1C in the last 72 hours. Lipid Profile: Recent Labs    04/15/20 1741  TRIG 167*   Thyroid function studies: No results for input(s): TSH, T4TOTAL, T3FREE, THYROIDAB in the last 72 hours.  Invalid input(s): FREET3 Anemia work up: Recent Labs    04/15/20 1741  FERRITIN 417*   Sepsis Labs: Recent Labs  Lab 04/15/20 1741 04/15/20 1857 04/15/20 2300  PROCALCITON <0.10  --   --   WBC 3.5*  --  3.2*  LATICACIDVEN 1.9 1.7  --    Microbiology Recent Results (from the past 240 hour(s))  Blood Culture (routine x 2)     Status: None (Preliminary result)   Collection Time: 04/15/20  5:41 PM   Specimen: BLOOD LEFT HAND  Result Value Ref Range Status   Specimen Description BLOOD LEFT HAND  Final   Special Requests   Final    BOTTLES DRAWN AEROBIC AND ANAEROBIC Blood Culture results may not be optimal due to an inadequate volume of blood received in culture bottles   Culture   Final    NO GROWTH < 12 HOURS Performed at [facility], [place] [address], [place], [place] [address]    Report Status PENDING  Incomplete  Respiratory Panel by RT PCR (Flu A&B, Covid) -  Nasopharyngeal Swab     Status: Abnormal   Collection Time: 04/15/20 11:00 PM   Specimen: Nasopharyngeal Swab  Result Value Ref Range Status   SARS Coronavirus 2 by RT PCR POSITIVE (A) NEGATIVE Final    Comment: RESULT CALLED TO, READ BACK BY AND VERIFIED WITH: C. CHRISCO,RN 0032 04/16/2000 T. TYSOR (NOTE) SARS-CoV-2 target nucleic acids are DETECTED.  SARS-CoV-2 RNA is generally detectable in upper respiratory specimens  during the acute phase of infection. Positive results are indicative of the presence of the identified virus, but do not rule out bacterial infection or co-infection with other pathogens not detected by the test. Clinical correlation with patient history and other diagnostic information is necessary to determine patient infection status. The expected result is Negative.  Fact Sheet for Patients:  PinkCheek.be  Fact Sheet for Healthcare Providers: GravelBags.it  This test is not yet approved or cleared by the [place] FDA and  has been authorized for detection and/or diagnosis of SARS-CoV-2 by FDA under an Emergency Use Authorization (EUA).  This EUA will remain in effect (meaning this test can  be used) for the duration of  the COVID-19 declaration under Section 564(b)(1) of the Act, 21 U.S.C. section 360bbb-3(b)(1), unless the authorization is terminated or revoked sooner.      Influenza A by PCR NEGATIVE NEGATIVE Final   Influenza B by PCR NEGATIVE NEGATIVE Final    Comment: (NOTE) The Xpert Xpress SARS-CoV-2/FLU/RSV assay is intended as an aid in  the diagnosis of influenza from Nasopharyngeal swab specimens and  should not be used as a sole basis for treatment. Nasal washings and  aspirates are unacceptable for Xpert Xpress SARS-CoV-2/FLU/RSV   testing.  Fact Sheet for Patients: PinkCheek.be  Fact Sheet for Healthcare Providers: GravelBags.it  This test is not yet approved or cleared by the [place] FDA and  has been authorized for detection and/or diagnosis of SARS-CoV-2 by  FDA under an Emergency Use Authorization (EUA). This EUA will remain  in effect (meaning this test can be used) for the duration of the  Covid-19 declaration under Section 564(b)(1) of the Act, 21  U.S.C. section 360bbb-3(b)(1), unless the authorization is  terminated or revoked. Performed at [facility], [place] [address] [place], [place] [address]      Medications:    vitamin C  500 mg Oral Daily   DULoxetine  90 mg Oral Daily   enoxaparin (LOVENOX) injection  40 mg Subcutaneous Q24H   methylPREDNISolone (SOLU-MEDROL) injection  35 mg Intravenous Q12H   Followed by   Derrill Memo ON 04/19/2020] predniSONE  50 mg Oral Daily   zinc sulfate  220 mg Oral Daily   Continuous Infusions:  [START ON 04/17/2020] remdesivir 100 mg in NS 100 mL        LOS: 1 day   Charlynne Cousins  [facility]  04/16/2020, 7:15 AM

## 2020-04-17 ENCOUNTER — Ambulatory Visit (HOSPITAL_COMMUNITY): Payer: BC Managed Care – PPO

## 2020-04-17 LAB — COMPREHENSIVE METABOLIC PANEL
ALT: 156 U/L — ABNORMAL HIGH (ref 0–44)
AST: 110 U/L — ABNORMAL HIGH (ref 15–41)
Albumin: 2.7 g/dL — ABNORMAL LOW (ref 3.5–5.0)
Alkaline Phosphatase: 49 U/L (ref 38–126)
Anion gap: 10 (ref 5–15)
BUN: 16 mg/dL (ref 6–20)
CO2: 22 mmol/L (ref 22–32)
Calcium: 9 mg/dL (ref 8.9–10.3)
Chloride: 106 mmol/L (ref 98–111)
Creatinine, Ser: 0.81 mg/dL (ref 0.44–1.00)
GFR calc Af Amer: >60 mL/min (ref >60)
GFR calc non Af Amer: >60 mL/min (ref >60)
Glucose, Bld: 148 mg/dL — ABNORMAL HIGH (ref 70–99)
Potassium: 4.8 mmol/L (ref 3.5–5.1)
Sodium: 138 mmol/L (ref 135–145)
Total Bilirubin: 0.7 mg/dL (ref 0.3–1.2)
Total Protein: 6.3 g/dL — ABNORMAL LOW (ref 6.5–8.1)

## 2020-04-17 LAB — CBC WITH DIFFERENTIAL/PLATELET
Abs Immature Granulocytes: 0.06 10*3/uL (ref 0.00–0.07)
Basophils Absolute: 0 10*3/uL (ref 0.0–0.1)
Basophils Relative: 0 %
Eosinophils Absolute: 0 10*3/uL (ref 0.0–0.5)
Eosinophils Relative: 0 %
HCT: 37.4 % (ref 36.0–46.0)
Hemoglobin: 12 g/dL (ref 12.0–15.0)
Immature Granulocytes: 1 %
Lymphocytes Relative: 13 %
Lymphs Abs: 0.6 10*3/uL — ABNORMAL LOW (ref 0.7–4.0)
MCH: 26.9 pg (ref 26.0–34.0)
MCHC: 32.1 g/dL (ref 30.0–36.0)
MCV: 83.9 fL (ref 80.0–100.0)
Monocytes Absolute: 0.2 10*3/uL (ref 0.1–1.0)
Monocytes Relative: 4 %
Neutro Abs: 3.9 10*3/uL (ref 1.7–7.7)
Neutrophils Relative %: 82 %
Platelets: 304 10*3/uL (ref 150–400)
RBC: 4.46 MIL/uL (ref 3.87–5.11)
RDW: 13.2 % (ref 11.5–15.5)
WBC: 4.9 10*3/uL (ref 4.0–10.5)
nRBC: 0 % (ref 0.0–0.2)

## 2020-04-17 LAB — C-REACTIVE PROTEIN: CRP: 3 mg/dL — ABNORMAL HIGH (ref <1.0)

## 2020-04-17 LAB — D-DIMER, QUANTITATIVE: D-Dimer, Quant: 1.24 ug/mL-FEU — ABNORMAL HIGH (ref 0.00–0.50)

## 2020-04-17 MED ORDER — METHYLPREDNISOLONE SODIUM SUCC 40 MG IJ SOLR
35.0000 mg | Freq: Two times a day (BID) | INTRAMUSCULAR | Status: AC
  Administered 2020-04-17 (×2): 35 mg via INTRAVENOUS
  Filled 2020-04-17 (×2): qty 1

## 2020-04-17 NOTE — Discharge Instructions (Signed)
Patient scheduled for outpatient Remdesivir infusions at 11am on Wednesday 9/29, Thursday 9/30, and Friday 10/1 at [facility]. Please inform the patient to park at [place], as staff will be escorting the patient through the [place] entrance of the hospital. Appointments take approximately 45 minutes.    There is a wave flag banner located near the entrance on [address]. Turn into this entrance and immediately turn left and park in 1 of the 5 designated Covid Infusion Parking spots. There is a phone number on the sign, please call and let the staff know what spot you are in and we will come out and get you. For questions call [phone].  Thanks.

## 2020-04-17 NOTE — Progress Notes (Signed)
SATURATION QUALIFICATIONS: (This note is used to comply with regulatory documentation for home oxygen)  Patient Saturations on Room Air at Rest = 93%  Patient Saturations on Room Air while Ambulating = 88%  Patient Saturations on 2 Liters of oxygen while Ambulating = 95%  Please briefly explain why patient needs home oxygen: 

## 2020-04-17 NOTE — Progress Notes (Signed)
Patient scheduled for outpatient Remdesivir infusions at 11am on Wednesday 9/29, Thursday 9/30, and Friday 10/1 at [facility]. Please inform the patient to park at [place], as staff will be escorting the patient through the [place] entrance of the hospital. Appointments take approximately 45 minutes.    There is a wave flag banner located near the entrance on [address]. Turn into this entrance and immediately turn left and park in 1 of the 5 designated Covid Infusion Parking spots. There is a phone number on the sign, please call and let the staff know what spot you are in and we will come out and get you. For questions call [phone].  Thanks.

## 2020-04-17 NOTE — Progress Notes (Signed)
[facility] PROGRESS NOTE    Progress Note  Jacqueline Garza  BOF:[id number] DOB: 07/06/1961 DOA: 04/15/2020 PCP: Tonia Ghent, MD     Brief Narrative:   Jacqueline Garza is an 59 y.o. female past medical history of colon cancer status post hemicolectomy in remission comes in for 1 week of shortness of breath and cough, she relates that her husband was diagnosed with COVID-19 about a week prior to admission.  She did get a monoclonal infusion yesterday despite this she was getting more short of breath and she decided to come to the ED, she was found to be hypoxic placed on 2 L she is x-ray showed bilateral infiltrates CPR was elevated D-dimer 1.7 he was started on IV remdesivir and steroids.  Assessment/Plan:   Acute respiratory failure with hypoxia secondary to Pneumonia due to COVID-19 virus She came off oxygen and now on room air at 6 AM this morning currently satting greater than 92%. We will go ahead and stress her and ambulate her and check her saturations. Continue IV remdesivir, will change steroids to oral.  And continue steroids for 24 hours then discontinued.  Inflammatory markers are improving. We will try to schedule her to start her remdesivir treatment on 04/19/2020 as an outpatient. I have already called the clinic and she is scheduled to start her treatment on 04/19/2020.  History of colon cancer noted   DVT prophylaxis: lovenxo Family Communication:none Status is: Inpatient  Remains inpatient appropriate because:Hemodynamically unstable   Dispo: The patient is from: Home              Anticipated d/c is to: Home              Anticipated d/c date is: 1 day              Patient currently is not medically stable to d/c.  Home 1 04/09/2020, will continue IV remdesivir as an outpatient.        Code Status:     Code Status Orders  (From admission, onward)         Start     Ordered   04/15/20 2151  Full code  Continuous        04/15/20 2151         Code Status History    Date Active Date Inactive Code Status Order ID Comments User Context   08/14/2015 1520 08/19/2015 1440 Full Code [id number]  Johnathan Hausen, MD Inpatient   Advance Care Planning Activity        IV Access:    Peripheral IV   Procedures and diagnostic studies:   DG Chest Portable 1 View  Result Date: 04/15/2020 CLINICAL DATA:  Cough and shortness of breath. COVID-19 virus infection. EXAM: PORTABLE CHEST 1 VIEW COMPARISON:  None. FINDINGS: The heart size and mediastinal contours are within normal limits. Mild airspace disease seen in both lung bases. No evidence of pleural effusion. IMPRESSION: Mild bibasilar airspace disease, suspicious for viral pneumonia. Electronically Signed   By: Marlaine Hind M.D.   On: 04/15/2020 14:50     Medical Consultants:    None.  Anti-Infectives:   remdesivir  Subjective:    Jacqueline Garza relates her breathing is significantly better than yesterday.  Objective:    Vitals:   04/16/20 2042 04/17/20 0006 04/17/20 0450 04/17/20 0715  BP: (!) 115/56 117/64 (!) 118/52   Pulse: 63 63 69   Resp: 20 20 18 16   Temp: 97.6 F (36.4 C) (!)  97.5 F (36.4 C) 97.7 F (36.5 C)   TempSrc: Oral Oral Oral   SpO2: 94% 94% 92%   Weight:      Height:       SpO2: 92 % O2 Flow Rate (L/min): 1 L/min  No intake or output data in the 24 hours ending 04/17/20 0812 Filed Weights   04/16/20 0100  Weight: 70 kg    Exam: General exam: In no acute distress. Respiratory system: Good air movement and clear to auscultation. Cardiovascular system: S1 & S2 heard, RRR. No JVD. Gastrointestinal system: Abdomen is nondistended, soft and nontender.  Extremities: No pedal edema. Skin: No rashes, lesions or ulcers  Data Reviewed:    Labs: Basic Metabolic Panel: Recent Labs  Lab 04/15/20 1741 04/15/20 2300 04/17/20 0059  NA 138  --  138  K 4.0  --  4.8  CL 103  --  106  CO2 23  --  22  GLUCOSE 100*  --  148*  BUN 10  --  16    CREATININE 0.90 0.90 0.81  CALCIUM 8.7*  --  9.0   GFR Estimated Creatinine Clearance: 66.1 mL/min (by C-G formula based on SCr of 0.81 mg/dL). Liver Function Tests: Recent Labs  Lab 04/15/20 1741 04/17/20 0059  AST 72* 110*  ALT 84* 156*  ALKPHOS 45 49  BILITOT 0.7 0.7  PROT 6.9 6.3*  ALBUMIN 3.0* 2.7*   No results for input(s): LIPASE, AMYLASE in the last 168 hours. No results for input(s): AMMONIA in the last 168 hours. Coagulation profile No results for input(s): INR, PROTIME in the last 168 hours. COVID-19 Labs  Recent Labs    04/15/20 1741 04/17/20 0059  DDIMER 1.27* 1.24*  FERRITIN 417*  --   LDH 313*  --   CRP 6.0* 3.0*    Lab Results  Component Value Date   SARSCOV2NAA POSITIVE (A) 04/15/2020    CBC: Recent Labs  Lab 04/15/20 1741 04/15/20 2300 04/17/20 0059  WBC 3.5* 3.2* 4.9  NEUTROABS 2.3  --  3.9  HGB 12.5 10.4* 12.0  HCT 39.1 33.0* 37.4  MCV 85.2 84.8 83.9  PLT 253 234 304   Cardiac Enzymes: No results for input(s): CKTOTAL, CKMB, CKMBINDEX, TROPONINI in the last 168 hours. BNP (last 3 results) No results for input(s): PROBNP in the last 8760 hours. CBG: No results for input(s): GLUCAP in the last 168 hours. D-Dimer: Recent Labs    04/15/20 1741 04/17/20 0059  DDIMER 1.27* 1.24*   Hgb A1c: No results for input(s): HGBA1C in the last 72 hours. Lipid Profile: Recent Labs    04/15/20 1741  TRIG 167*   Thyroid function studies: No results for input(s): TSH, T4TOTAL, T3FREE, THYROIDAB in the last 72 hours.  Invalid input(s): FREET3 Anemia work up: Recent Labs    04/15/20 1741  FERRITIN 417*   Sepsis Labs: Recent Labs  Lab 04/15/20 1741 04/15/20 1857 04/15/20 2300 04/17/20 0059  PROCALCITON <0.10  --   --   --   WBC 3.5*  --  3.2* 4.9  LATICACIDVEN 1.9 1.7  --   --    Microbiology Recent Results (from the past 240 hour(s))  Blood Culture (routine x 2)     Status: None (Preliminary result)   Collection Time:  04/15/20  5:41 PM   Specimen: BLOOD LEFT HAND  Result Value Ref Range Status   Specimen Description BLOOD LEFT HAND  Final   Special Requests   Final    BOTTLES DRAWN  AEROBIC AND ANAEROBIC Blood Culture results may not be optimal due to an inadequate volume of blood received in culture bottles   Culture   Final    NO GROWTH 2 DAYS Performed at [facility], [place] [address] [place], [place] [address]    Report Status PENDING  Incomplete  Blood Culture (routine x 2)     Status: None (Preliminary result)   Collection Time: 04/15/20  6:57 PM   Specimen: BLOOD LEFT FOREARM  Result Value Ref Range Status   Specimen Description BLOOD LEFT FOREARM  Final   Special Requests   Final    BOTTLES DRAWN AEROBIC AND ANAEROBIC Blood Culture results may not be optimal due to an inadequate volume of blood received in culture bottles   Culture   Final    NO GROWTH 2 DAYS Performed at [facility], [place] [address] [place], [place] [address]    Report Status PENDING  Incomplete  Respiratory Panel by RT PCR (Flu A&B, Covid) - Nasopharyngeal Swab     Status: Abnormal   Collection Time: 04/15/20 11:00 PM   Specimen: Nasopharyngeal Swab  Result Value Ref Range Status   SARS Coronavirus 2 by RT PCR POSITIVE (A) NEGATIVE Final    Comment: RESULT CALLED TO, READ BACK BY AND VERIFIED WITH: C. CHRISCO,RN 0032 04/16/2000 T. TYSOR (NOTE) SARS-CoV-2 target nucleic acids are DETECTED.  SARS-CoV-2 RNA is generally detectable in upper respiratory specimens  during the acute phase of infection. Positive results are indicative of the presence of the identified virus, but do not rule out bacterial infection or co-infection with other pathogens not detected by the test. Clinical correlation with patient history and other diagnostic information is necessary to determine patient infection status. The expected result is Negative.  Fact Sheet for Patients:  PinkCheek.be  Fact  Sheet for Healthcare Providers: GravelBags.it  This test is not yet approved or cleared by the [place] FDA and  has been authorized for detection and/or diagnosis of SARS-CoV-2 by FDA under an Emergency Use Authorization (EUA).  This EUA will remain in effect (meaning this test can  be used) for the duration of  the COVID-19 declaration under Section 564(b)(1) of the Act, 21 U.S.C. section 360bbb-3(b)(1), unless the authorization is terminated or revoked sooner.      Influenza A by PCR NEGATIVE NEGATIVE Final   Influenza B by PCR NEGATIVE NEGATIVE Final    Comment: (NOTE) The Xpert Xpress SARS-CoV-2/FLU/RSV assay is intended as an aid in  the diagnosis of influenza from Nasopharyngeal swab specimens and  should not be used as a sole basis for treatment. Nasal washings and  aspirates are unacceptable for Xpert Xpress SARS-CoV-2/FLU/RSV  testing.  Fact Sheet for Patients: PinkCheek.be  Fact Sheet for Healthcare Providers: GravelBags.it  This test is not yet approved or cleared by the [place] FDA and  has been authorized for detection and/or diagnosis of SARS-CoV-2 by  FDA under an Emergency Use Authorization (EUA). This EUA will remain  in effect (meaning this test can be used) for the duration of the  Covid-19 declaration under Section 564(b)(1) of the Act, 21  U.S.C. section 360bbb-3(b)(1), unless the authorization is  terminated or revoked. Performed at [facility], [place] [address], [place], [place] [address]      Medications:   . vitamin C  500 mg Oral Daily  . DULoxetine  90 mg Oral Daily  . enoxaparin (LOVENOX) injection  40 mg Subcutaneous Q24H  .  methylPREDNISolone (SOLU-MEDROL) injection  35 mg Intravenous Q12H   Followed by  . [START ON 04/19/2020] predniSONE  50 mg Oral Daily  . zinc sulfate  220 mg Oral Daily   Continuous Infusions: . remdesivir 100 mg in  NS 100 mL        LOS: 2 days   Charlynne Cousins  [facility]  04/17/2020, 8:12 AM

## 2020-04-18 ENCOUNTER — Ambulatory Visit (HOSPITAL_COMMUNITY): Payer: BC Managed Care – PPO

## 2020-04-18 DIAGNOSIS — Z85038 Personal history of other malignant neoplasm of large intestine: Secondary | ICD-10-CM

## 2020-04-18 DIAGNOSIS — R0902 Hypoxemia: Secondary | ICD-10-CM

## 2020-04-18 LAB — CBC WITH DIFFERENTIAL/PLATELET
Abs Immature Granulocytes: 0.29 10*3/uL — ABNORMAL HIGH (ref 0.00–0.07)
Basophils Absolute: 0 10*3/uL (ref 0.0–0.1)
Basophils Relative: 1 %
Eosinophils Absolute: 0 10*3/uL (ref 0.0–0.5)
Eosinophils Relative: 0 %
HCT: 39 % (ref 36.0–46.0)
Hemoglobin: 12.7 g/dL (ref 12.0–15.0)
Immature Granulocytes: 4 %
Lymphocytes Relative: 14 %
Lymphs Abs: 1 10*3/uL (ref 0.7–4.0)
MCH: 27.2 pg (ref 26.0–34.0)
MCHC: 32.6 g/dL (ref 30.0–36.0)
MCV: 83.5 fL (ref 80.0–100.0)
Monocytes Absolute: 0.4 10*3/uL (ref 0.1–1.0)
Monocytes Relative: 5 %
Neutro Abs: 5.3 10*3/uL (ref 1.7–7.7)
Neutrophils Relative %: 76 %
Platelets: 407 10*3/uL — ABNORMAL HIGH (ref 150–400)
RBC: 4.67 MIL/uL (ref 3.87–5.11)
RDW: 13.2 % (ref 11.5–15.5)
WBC: 6.9 10*3/uL (ref 4.0–10.5)
nRBC: 0 % (ref 0.0–0.2)

## 2020-04-18 LAB — COMPREHENSIVE METABOLIC PANEL
ALT: 126 U/L — ABNORMAL HIGH (ref 0–44)
AST: 58 U/L — ABNORMAL HIGH (ref 15–41)
Albumin: 2.9 g/dL — ABNORMAL LOW (ref 3.5–5.0)
Alkaline Phosphatase: 51 U/L (ref 38–126)
Anion gap: 14 (ref 5–15)
BUN: 18 mg/dL (ref 6–20)
CO2: 21 mmol/L — ABNORMAL LOW (ref 22–32)
Calcium: 8.9 mg/dL (ref 8.9–10.3)
Chloride: 104 mmol/L (ref 98–111)
Creatinine, Ser: 0.96 mg/dL (ref 0.44–1.00)
GFR calc Af Amer: >60 mL/min (ref >60)
GFR calc non Af Amer: >60 mL/min (ref >60)
Glucose, Bld: 129 mg/dL — ABNORMAL HIGH (ref 70–99)
Potassium: 4.4 mmol/L (ref 3.5–5.1)
Sodium: 139 mmol/L (ref 135–145)
Total Bilirubin: 0.5 mg/dL (ref 0.3–1.2)
Total Protein: 6.6 g/dL (ref 6.5–8.1)

## 2020-04-18 LAB — D-DIMER, QUANTITATIVE: D-Dimer, Quant: 1.75 ug/mL-FEU — ABNORMAL HIGH (ref 0.00–0.50)

## 2020-04-18 LAB — C-REACTIVE PROTEIN: CRP: 1 mg/dL — ABNORMAL HIGH (ref <1.0)

## 2020-04-18 MED ORDER — DEXAMETHASONE 6 MG PO TABS: 6.0000 mg | ORAL_TABLET | Freq: Every day | ORAL | 0 refills | Status: DC

## 2020-04-18 MED ORDER — GUAIFENESIN-DM 100-10 MG/5ML PO SYRP: 10.0000 mL | ORAL_SOLUTION | ORAL | 0 refills | Status: DC | PRN

## 2020-04-18 NOTE — Discharge Summary (Signed)
Physician Discharge Summary  Jacqueline Garza QQP:[id number] DOB: 03-29-1961 DOA: 04/15/2020  PCP: Tonia Ghent, MD  Admit date: 04/15/2020 Discharge date: 04/18/2020  Admitted From: home Disposition:  home  Recommendations for Outpatient Follow-up:  1. Follow up with PCP in 1-2 weeks 2. Patient instructed to self isolate for 21 days following her positive Covid result.   Lab Results  Component Value Date   SARSCOV2NAA POSITIVE (A) 04/15/2020     Home Health: none Equipment/Devices: none  Discharge Condition: stable CODE STATUS: Full code Diet recommendation: regular  HPI: Per admitting MD, Jacqueline Garza is a 59 y.o. female with history of colon cancer status post hemicolectomy and in remission has been experiencing shortness of breath with cough over the last 1 week. Patient states her husband was diagnosed with Covid infection last week and she tested positive for Covid with her home kit. She did get monoclonal antibody infusion yesterday. Despite which patient was getting more short of breath and coughing decided to come to the ER. Denies any nausea vomiting diarrhea fever or chills.  Hospital Course / Discharge diagnoses: Acute respiratory failure with hypoxia secondary to Pneumonia due to COVID-19 virus -she initially required 2 L of oxygen, rapidly was weaned off to room air, on my evaluation she is comfortable, satting 95% on room air, able to ambulate without difficulties.  She was started on remdesivir, received 3 days while hospitalized, she is stable to discharge with 2 additional days left as an outpatient in the infusion center.  Continue steroids on discharge for 5 additional days.  Elevated LFTs-likely in the setting of COVID-19 infection, improving.  Repeat as an outpatient in a week History of colon cancer -noted  Discharge Instructions  Allergies as of 04/18/2020      Reactions   Hydroxychloroquine Itching   Methotrexate Derivatives Other (See Comments)     Elevated liver test   Sulfa Antibiotics Itching   Tessalon [benzonatate] Other (See Comments)   Lack of effect.       Medication List    TAKE these medications   conjugated estrogens vaginal cream Commonly known as: PREMARIN Place vaginally 2 (two) times a week.   dexamethasone 6 MG tablet Commonly known as: DECADRON Take 1 tablet (6 mg total) by mouth daily.   DULoxetine 60 MG capsule Commonly known as: CYMBALTA Take 1 a day with 36m for 958mtotal.  Please make an appointment for physical exam prior to further refills. What changed:   how much to take  how to take this  when to take this  additional instructions   DULoxetine 30 MG capsule Commonly known as: CYMBALTA TAKE 1 CAP A DAY WITH 60MG FOR 90MG TOTAL.Please make an appointment for physical exam prior to further refills. What changed:   how much to take  how to take this  when to take this  additional instructions   guaiFENesin-dextromethorphan 100-10 MG/5ML syrup Commonly known as: ROBITUSSIN DM Take 10 mLs by mouth every 4 (four) hours as needed for cough.       Consultations:  None  Procedures/Studies:  DG Chest Portable 1 View  Result Date: 04/15/2020 CLINICAL DATA:  Cough and shortness of breath. COVID-19 virus infection. EXAM: PORTABLE CHEST 1 VIEW COMPARISON:  None. FINDINGS: The heart size and mediastinal contours are within normal limits. Mild airspace disease seen in both lung bases. No evidence of pleural effusion. IMPRESSION: Mild bibasilar airspace disease, suspicious for viral pneumonia. Electronically Signed   By: JoMarlaine Hind  M.D.   On: 04/15/2020 14:50     Subjective: - no chest pain, shortness of breath, no abdominal pain, nausea or vomiting.   Discharge Exam: BP (!) 150/77 (BP Location: Left Arm)   Pulse 70   Temp 97.9 F (36.6 C) (Oral)   Resp 20   Ht '5\' 1"'  (1.549 m)   Wt 70 kg   LMP 08/19/2010   SpO2 93%   BMI 29.17 kg/m   General: Pt is alert, awake, not  in acute distress Cardiovascular: RRR, S1/S2 +, no rubs, no gallops Respiratory: CTA bilaterally, no wheezing, no rhonchi Abdominal: Soft, NT, ND, bowel sounds + Extremities: no edema, no cyanosis   The results of significant diagnostics from this hospitalization (including imaging, microbiology, ancillary and laboratory) are listed below for reference.     Microbiology: Recent Results (from the past 240 hour(s))  Blood Culture (routine x 2)     Status: None (Preliminary result)   Collection Time: 04/15/20  5:41 PM   Specimen: BLOOD LEFT HAND  Result Value Ref Range Status   Specimen Description BLOOD LEFT HAND  Final   Special Requests   Final    BOTTLES DRAWN AEROBIC AND ANAEROBIC Blood Culture results may not be optimal due to an inadequate volume of blood received in culture bottles   Culture   Final    NO GROWTH 3 DAYS Performed at [facility], [place] [address], [place], [place] [address]    Report Status PENDING  Incomplete  Blood Culture (routine x 2)     Status: None (Preliminary result)   Collection Time: 04/15/20  6:57 PM   Specimen: BLOOD LEFT FOREARM  Result Value Ref Range Status   Specimen Description BLOOD LEFT FOREARM  Final   Special Requests   Final    BOTTLES DRAWN AEROBIC AND ANAEROBIC Blood Culture results may not be optimal due to an inadequate volume of blood received in culture bottles   Culture   Final    NO GROWTH 3 DAYS Performed at [facility], [place] [address] [place],  [address]    Report Status PENDING  Incomplete  Respiratory Panel by RT PCR (Flu A&B, Covid) - Nasopharyngeal Swab     Status: Abnormal   Collection Time: 04/15/20 11:00 PM   Specimen: Nasopharyngeal Swab  Result Value Ref Range Status   SARS Coronavirus 2 by RT PCR POSITIVE (A) NEGATIVE Final    Comment: RESULT CALLED TO, READ BACK BY AND VERIFIED WITH: C. CHRISCO,RN 0032 04/16/2000 T. TYSOR (NOTE) SARS-CoV-2 target nucleic acids are DETECTED.  SARS-CoV-2  RNA is generally detectable in upper respiratory specimens  during the acute phase of infection. Positive results are indicative of the presence of the identified virus, but do not rule out bacterial infection or co-infection with other pathogens not detected by the test. Clinical correlation with patient history and other diagnostic information is necessary to determine patient infection status. The expected result is Negative.  Fact Sheet for Patients:  PinkCheek.be  Fact Sheet for Healthcare Providers: GravelBags.it  This test is not yet approved or cleared by the [place] FDA and  has been authorized for detection and/or diagnosis of SARS-CoV-2 by FDA under an Emergency Use Authorization (EUA).  This EUA will remain in effect (meaning this test can  be used) for the duration of  the COVID-19 declaration under Section 564(b)(1) of the Act, 21 U.S.C. section 360bbb-3(b)(1), unless the authorization is terminated or revoked sooner.  Influenza A by PCR NEGATIVE NEGATIVE Final   Influenza B by PCR NEGATIVE NEGATIVE Final    Comment: (NOTE) The Xpert Xpress SARS-CoV-2/FLU/RSV assay is intended as an aid in  the diagnosis of influenza from Nasopharyngeal swab specimens and  should not be used as a sole basis for treatment. Nasal washings and  aspirates are unacceptable for Xpert Xpress SARS-CoV-2/FLU/RSV  testing.  Fact Sheet for Patients: PinkCheek.be  Fact Sheet for Healthcare Providers: GravelBags.it  This test is not yet approved or cleared by the [place] FDA and  has been authorized for detection and/or diagnosis of SARS-CoV-2 by  FDA under an Emergency Use Authorization (EUA). This EUA will remain  in effect (meaning this test can be used) for the duration of the  Covid-19 declaration under Section 564(b)(1) of the Act, 21  U.S.C. section  360bbb-3(b)(1), unless the authorization is  terminated or revoked. Performed at [place] [facility], [place] [address] [place], [place] [address]      Labs: Basic Metabolic Panel: Recent Labs  Lab 04/15/20 1741 04/15/20 2300 04/17/20 0059 04/18/20 0823  NA 138  --  138 139  K 4.0  --  4.8 4.4  CL 103  --  106 104  CO2 23  --  22 21*  GLUCOSE 100*  --  148* 129*  BUN 10  --  16 18  CREATININE 0.90 0.90 0.81 0.96  CALCIUM 8.7*  --  9.0 8.9   Liver Function Tests: Recent Labs  Lab 04/15/20 1741 04/17/20 0059 04/18/20 0823  AST 72* 110* 58*  ALT 84* 156* 126*  ALKPHOS 45 49 51  BILITOT 0.7 0.7 0.5  PROT 6.9 6.3* 6.6  ALBUMIN 3.0* 2.7* 2.9*   CBC: Recent Labs  Lab 04/15/20 1741 04/15/20 2300 04/17/20 0059 04/18/20 0823  WBC 3.5* 3.2* 4.9 6.9  NEUTROABS 2.3  --  3.9 5.3  HGB 12.5 10.4* 12.0 12.7  HCT 39.1 33.0* 37.4 39.0  MCV 85.2 84.8 83.9 83.5  PLT 253 234 304 407*   CBG: No results for input(s): GLUCAP in the last 168 hours. Hgb A1c No results for input(s): HGBA1C in the last 72 hours. Lipid Profile Recent Labs    04/15/20 1741  TRIG 167*   Thyroid function studies No results for input(s): TSH, T4TOTAL, T3FREE, THYROIDAB in the last 72 hours.  Invalid input(s): FREET3 Urinalysis No results found for: COLORURINE, APPEARANCEUR, LABSPEC, PHURINE, GLUCOSEU, HGBUR, BILIRUBINUR, KETONESUR, PROTEINUR, UROBILINOGEN, NITRITE, LEUKOCYTESUR  FURTHER DISCHARGE INSTRUCTIONS:   Get Medicines reviewed and adjusted: Please take all your medications with you for your next visit with your Primary MD   Laboratory/radiological data: Please request your Primary MD to go over all hospital tests and procedure/radiological results at the follow up, please ask your Primary MD to get all Hospital records sent to his/her office.   In some cases, they will be blood work, cultures and biopsy results pending at the time of your discharge. Please request that your primary care  M.D. goes through all the records of your hospital data and follows up on these results.   Also Note the following: If you experience worsening of your admission symptoms, develop shortness of breath, life threatening emergency, suicidal or homicidal thoughts you must seek medical attention immediately by calling 911 or calling your MD immediately  if symptoms less severe.   You must read complete instructions/literature along with all the possible adverse reactions/side effects for all the Medicines you take and that have been prescribed  to you. Take any new Medicines after you have completely understood and accpet all the possible adverse reactions/side effects.    Do not drive when taking Pain medications or sleeping medications (Benzodaizepines)   Do not take more than prescribed Pain, Sleep and Anxiety Medications. It is not advisable to combine anxiety,sleep and pain medications without talking with your primary care practitioner   Special Instructions: If you have smoked or chewed Tobacco  in the last 2 yrs please stop smoking, stop any regular Alcohol  and or any Recreational drug use.   Wear Seat belts while driving.   Please note: You were cared for by a hospitalist during your hospital stay. Once you are discharged, your primary care physician will handle any further medical issues. Please note that NO REFILLS for any discharge medications will be authorized once you are discharged, as it is imperative that you return to your primary care physician (or establish a relationship with a primary care physician if you do not have one) for your post hospital discharge needs so that they can reassess your need for medications and monitor your lab values.  Time coordinating discharge: 35 minutes  SIGNED:  Marzetta Board, MD, PhD 04/18/2020, 10:40 AM

## 2020-04-19 ENCOUNTER — Ambulatory Visit (HOSPITAL_COMMUNITY)
Admit: 2020-04-19 | Discharge: 2020-04-19 | Disposition: A | Discharge status: Home / Self Care | Payer: BC Managed Care – PPO | Attending: Pulmonary Disease | Admitting: Pulmonary Disease

## 2020-04-19 DIAGNOSIS — J1289 Other viral pneumonia: Secondary | ICD-10-CM | POA: Diagnosis present

## 2020-04-19 DIAGNOSIS — U071 COVID-19: Secondary | ICD-10-CM | POA: Insufficient documentation

## 2020-04-19 MED ORDER — SODIUM CHLORIDE 0.9 % IV SOLN
100.0000 mg | Freq: Once | INTRAVENOUS | Status: AC
  Administered 2020-04-19: 100 mg via INTRAVENOUS
  Filled 2020-04-19: qty 20

## 2020-04-19 MED ORDER — EPINEPHRINE 0.3 MG/0.3ML IJ SOAJ: 0.3000 mg | Freq: Once | INTRAMUSCULAR | Status: DC | PRN

## 2020-04-19 MED ORDER — DIPHENHYDRAMINE HCL 50 MG/ML IJ SOLN: 50.0000 mg | Freq: Once | INTRAMUSCULAR | Status: DC | PRN

## 2020-04-19 MED ORDER — FAMOTIDINE IN NACL 20-0.9 MG/50ML-% IV SOLN: 20.0000 mg | Freq: Once | INTRAVENOUS | Status: DC | PRN

## 2020-04-19 MED ORDER — SODIUM CHLORIDE 0.9 % IV SOLN: INTRAVENOUS | Status: DC | PRN

## 2020-04-19 MED ORDER — ALBUTEROL SULFATE HFA 108 (90 BASE) MCG/ACT IN AERS: 2.0000 | INHALATION_SPRAY | Freq: Once | RESPIRATORY_TRACT | Status: DC | PRN

## 2020-04-19 MED ORDER — METHYLPREDNISOLONE SODIUM SUCC 125 MG IJ SOLR: 125.0000 mg | Freq: Once | INTRAMUSCULAR | Status: DC | PRN

## 2020-04-19 NOTE — Discharge Instructions (Signed)
10 Things You Can Do to Manage Your COVID-19 Symptoms at Home If you have possible or confirmed COVID-19: 1. Stay home from work and school. And stay away from other public places. If you must go out, avoid using any kind of public transportation, ridesharing, or taxis. 2. Monitor your symptoms carefully. If your symptoms get worse, call your healthcare provider immediately. 3. Get rest and stay hydrated. 4. If you have a medical appointment, call the healthcare provider ahead of time and tell them that you have or may have COVID-19. 5. For medical emergencies, call 911 and notify the dispatch personnel that you have or may have COVID-19. 6. Cover your cough and sneezes with a tissue or use the inside of your elbow. 7. Wash your hands often with soap and water for at least 20 seconds or clean your hands with an alcohol-based hand sanitizer that contains at least 60% alcohol. 8. As much as possible, stay in a specific room and away from other people in your home. Also, you should use a separate bathroom, if available. If you need to be around other people in or outside of the home, wear a mask. 9. Avoid sharing personal items with other people in your household, like dishes, towels, and bedding. 10. Clean all surfaces that are touched often, like counters, tabletops, and doorknobs. Use household cleaning sprays or wipes according to the label instructions. cdc.gov/coronavirus 01/19/2019 This information is not intended to replace advice given to you by your health care provider. Make sure you discuss any questions you have with your health care provider. Document Revised: 06/23/2019 Document Reviewed: 06/23/2019 Elsevier Patient Education  2020 Elsevier Inc.  

## 2020-04-19 NOTE — Progress Notes (Signed)
  Diagnosis: COVID-19  Physician: Asencion Noble, MD  Procedure: [facility] Med: remdesivir infusion - Provided patient with remdesivir fact sheet for patients, parents and caregivers prior to infusion.  Complications: No immediate complications noted.  Discharge: Discharged home   Jacqueline Garza 04/19/2020

## 2020-04-20 ENCOUNTER — Ambulatory Visit (HOSPITAL_COMMUNITY)
Admit: 2020-04-20 | Discharge: 2020-04-20 | Disposition: A | Discharge status: Home / Self Care | Payer: BC Managed Care – PPO | Source: Ambulatory Visit | Attending: Pulmonary Disease | Admitting: Pulmonary Disease

## 2020-04-20 DIAGNOSIS — J1282 Pneumonia due to coronavirus disease 2019: Secondary | ICD-10-CM | POA: Diagnosis not present

## 2020-04-20 DIAGNOSIS — U071 COVID-19: Secondary | ICD-10-CM | POA: Diagnosis present

## 2020-04-20 LAB — CULTURE, BLOOD (ROUTINE X 2)
Culture: NO GROWTH
Culture: NO GROWTH

## 2020-04-20 MED ORDER — EPINEPHRINE 0.3 MG/0.3ML IJ SOAJ: 0.3000 mg | Freq: Once | INTRAMUSCULAR | Status: DC | PRN

## 2020-04-20 MED ORDER — DIPHENHYDRAMINE HCL 50 MG/ML IJ SOLN: 50.0000 mg | Freq: Once | INTRAMUSCULAR | Status: DC | PRN

## 2020-04-20 MED ORDER — SODIUM CHLORIDE 0.9 % IV SOLN
100.0000 mg | Freq: Once | INTRAVENOUS | Status: AC
  Administered 2020-04-20: 100 mg via INTRAVENOUS
  Filled 2020-04-20: qty 20

## 2020-04-20 MED ORDER — SODIUM CHLORIDE 0.9 % IV SOLN: INTRAVENOUS | Status: DC | PRN

## 2020-04-20 MED ORDER — METHYLPREDNISOLONE SODIUM SUCC 125 MG IJ SOLR: 125.0000 mg | Freq: Once | INTRAMUSCULAR | Status: DC | PRN

## 2020-04-20 MED ORDER — ALBUTEROL SULFATE HFA 108 (90 BASE) MCG/ACT IN AERS: 2.0000 | INHALATION_SPRAY | Freq: Once | RESPIRATORY_TRACT | Status: DC | PRN

## 2020-04-20 MED ORDER — FAMOTIDINE IN NACL 20-0.9 MG/50ML-% IV SOLN: 20.0000 mg | Freq: Once | INTRAVENOUS | Status: DC | PRN

## 2020-04-20 NOTE — Discharge Instructions (Signed)
10 Things You Can Do to Manage Your COVID-19 Symptoms at Home If you have possible or confirmed COVID-19: 1. Stay home from work and school. And stay away from other public places. If you must go out, avoid using any kind of public transportation, ridesharing, or taxis. 2. Monitor your symptoms carefully. If your symptoms get worse, call your healthcare provider immediately. 3. Get rest and stay hydrated. 4. If you have a medical appointment, call the healthcare provider ahead of time and tell them that you have or may have COVID-19. 5. For medical emergencies, call 911 and notify the dispatch personnel that you have or may have COVID-19. 6. Cover your cough and sneezes with a tissue or use the inside of your elbow. 7. Wash your hands often with soap and water for at least 20 seconds or clean your hands with an alcohol-based hand sanitizer that contains at least 60% alcohol. 8. As much as possible, stay in a specific room and away from other people in your home. Also, you should use a separate bathroom, if available. If you need to be around other people in or outside of the home, wear a mask. 9. Avoid sharing personal items with other people in your household, like dishes, towels, and bedding. 10. Clean all surfaces that are touched often, like counters, tabletops, and doorknobs. Use household cleaning sprays or wipes according to the label instructions. cdc.gov/coronavirus 01/19/2019 This information is not intended to replace advice given to you by your health care provider. Make sure you discuss any questions you have with your health care provider. Document Revised: 06/23/2019 Document Reviewed: 06/23/2019 Elsevier Patient Education  2020 Elsevier Inc.  

## 2020-04-20 NOTE — Progress Notes (Signed)
  Diagnosis: COVID-19  Physician: Joya Gaskins, MD  Procedure: [facility] Infusion Clinic Med: remdesivir infusion - Provided patient with remdesivir fact sheet for patients, parents and caregivers prior to infusion.  Complications: No immediate complications noted.  Discharge: Discharged home   [place] 04/20/2020

## 2020-04-23 ENCOUNTER — Telehealth: Payer: Self-pay | Admitting: Internal Medicine

## 2020-04-23 NOTE — Telephone Encounter (Signed)
Spoke with patient about scheduling follow up with Margaretann Loveless from recall list, patient stated they would call back to get appointment scheduled later, just discharged with covid pneumonia per patient

## 2020-04-26 ENCOUNTER — Telehealth: Payer: Self-pay | Admitting: Internal Medicine

## 2020-04-26 NOTE — Telephone Encounter (Signed)
Called patient to schedule follow up with Jacqueline Garza from recall list, patient answered but once I started introduction patient hung up

## 2020-05-07 ENCOUNTER — Ambulatory Visit: Payer: BC Managed Care – PPO | Admitting: Internal Medicine

## 2020-05-08 ENCOUNTER — Ambulatory Visit: Payer: BC Managed Care – PPO | Admitting: Internal Medicine

## 2020-05-08 ENCOUNTER — Telehealth (INDEPENDENT_AMBULATORY_CARE_PROVIDER_SITE_OTHER): Payer: BC Managed Care – PPO | Admitting: Internal Medicine

## 2020-05-08 VITALS — Ht 61.0 in | Wt 150.0 lb

## 2020-05-08 DIAGNOSIS — E785 Hyperlipidemia, unspecified: Secondary | ICD-10-CM | POA: Diagnosis not present

## 2020-05-08 DIAGNOSIS — R079 Chest pain, unspecified: Secondary | ICD-10-CM

## 2020-05-08 NOTE — Patient Instructions (Signed)
Medication Instructions:  No Changes In Medications at this time.  *If you need a refill on your cardiac medications before your next appointment, please call your pharmacy*  Lab Work: [place] PROVIDER IN THE NEXT 1-2 MONTHS AND SEND RESULTS TO OUR OFFICE If you have labs (blood work) drawn today and your tests are completely normal, you will receive your results only by: Marland Kitchen MyChart Message (if you have MyChart) OR . A paper copy in the mail If you have any lab test that is abnormal or we need to change your treatment, we will call you to review the results.  Testing/Procedures: None Ordered At This Time.   Follow-Up: At [facility], you and your health needs are our priority.  As part of our continuing mission to provide you with exceptional heart care, we have created designated Provider Care Teams.  These Care Teams include your primary Cardiologist (physician) and Advanced Practice Providers (APPs -  Physician Assistants and Nurse Practitioners) who all work together to provide you with the care you need, when you need it.  Your next appointment:   AS NEEDED  The format for your next appointment:   In Person  Provider:   Cherlynn Kaiser, MD

## 2020-05-08 NOTE — Progress Notes (Signed)
Virtual Visit via Telephone Note   This visit type was conducted due to national recommendations for restrictions regarding the COVID-19 Pandemic (e.g. social distancing) in an effort to limit this patient's exposure and mitigate transmission in our community.  Due to her co-morbid illnesses, this patient is at least at moderate risk for complications without adequate follow up.  This format is felt to be most appropriate for this patient at this time.  The patient did not have access to video technology/had technical difficulties with video requiring transitioning to audio format only (telephone).  All issues noted in this document were discussed and addressed.  No physical exam could be performed with this format.  Please refer to the patient's chart for her  consent to telehealth for [facility].    Date:  05/08/2020   ID:  Jacqueline Garza, DOB Nov 29, 1960, MRN [id number] The patient was identified using 2 identifiers.  Patient Location: Home Provider Location: Home Office  PCP:  Tonia Ghent, MD  Cardiologist:  Elouise Munroe, MD  Electrophysiologist:  None   Evaluation Performed:  Follow-Up Visit  Chief Complaint:  Follow up chest pain  History of Present Illness:    Jacqueline Garza is a 59 y.o. female with depression, GERD, rheumatoid arthritis, colon cancer jan 2017 requiring resection without chemotherapy or radiation. Initially seen for chest pain, with normal CCTA.  No significant recurrence of symptoms. Overall feeling well. The patient denies chest pain, chest pressure, dyspnea at rest or with exertion, palpitations, PND, orthopnea, or leg swelling. Denies cough, fever, chills. Denies nausea, vomiting. Denies syncope or presyncope. Denies dizziness or lightheadedness. Denies snoring.    The patient does not have symptoms concerning for COVID-19 infection (fever, chills, cough, or new shortness of breath).    Past Medical History:  Diagnosis Date  . 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 Basal cell carcinoma (BCC) of face   . Cancer ([place])    basal cell carcinoma / facial area and back / right leg   . Colon cancer ([place])   . Depression   . Dysplastic nevus of left lower extremity 11/06/2011  . Edema    with occ HCTZ use  . GERD (gastroesophageal reflux disease)   . History of colon cancer   . History of hiatal hernia   . Rheumatoid arthritis(714.0)    with prev MTX intolerance; per [facility]  . Wears glasses    Past Surgical History:  Procedure Laterality Date  . CESAREAN SECTION    . COLON SURGERY    . COLONOSCOPY    . colonscopy      removed polyps  . LAPAROSCOPIC SIGMOID COLECTOMY N/A 08/14/2015   Procedure: LAPAROSCOPIC ENTEROLYSIS AND MOBILIZATION OF SPLENIC FLEXURE; SIGMOID COLECTOMY WITH PRIMARY HAND SEWN ANASTOMOSIS;  ENDING LAPAROSCOPY AND INJECTION OF EXPAREL;  Surgeon: Johnathan Hausen, MD;  Location: [facility];  Service: General;  Laterality: N/A;  . TUBAL LIGATION       Current Meds  Medication Sig  . conjugated estrogens (PREMARIN) vaginal cream Place vaginally 2 (two) times a week.  . DULoxetine (CYMBALTA) 30 MG capsule TAKE 1 CAP A DAY WITH 60MG  FOR 90MG  TOTAL.Please make an appointment for physical exam prior to further refills. (Patient taking differently: Take 30 mg by mouth See admin instructions. Take 1 capsule (30 mg) combine with 1 capsule (60 mg) to make (90 mg totally) by mouth every day)  . DULoxetine (CYMBALTA) 60 MG capsule  Take 1 a day with 30mg  for 90mg  total.  Please make an appointment for physical exam prior to further refills. (Patient taking differently: Take 60 mg by mouth See admin instructions. Take 1 capsule (60 mg) combine with 1 capsule (30 mg) to make (90 mg totally) by mouth every day)     Allergies:   Hydroxychloroquine, Methotrexate derivatives, Sulfa antibiotics, and Tessalon [benzonatate]   Social History   Tobacco Use  . Smoking status: Never Smoker  . Smokeless  tobacco: Never Used  Substance Use Topics  . Alcohol use: Not Currently    Alcohol/week: 0.0 standard drinks    Comment: rarely   . Drug use: No     Family Hx: The patient's family history includes Breast cancer in her cousin; Hypertension in her father; Leukemia in her sister. There is no history of Colon cancer, Esophageal cancer, Rectal cancer, or Stomach cancer.  ROS:   Please see the history of present illness.     All other systems reviewed and are negative.   Prior CV studies:   The following studies were reviewed today:    Labs/Other Tests and Data Reviewed:    EKG:  No ECG reviewed.  Recent Labs: 04/18/2020: ALT 126; BUN 18; Creatinine, Ser 0.96; Hemoglobin 12.7; Platelets 407; Potassium 4.4; Sodium 139   Recent Lipid Panel Lab Results  Component Value Date/Time   CHOL 201 (H) 01/11/2019 02:08 PM   TRIG 167 (H) 04/15/2020 05:41 PM   HDL 50 01/11/2019 02:08 PM   CHOLHDL 4.0 01/11/2019 02:08 PM   LDLCALC 115 (H) 01/11/2019 02:08 PM    Wt Readings from Last 3 Encounters:  05/08/20 150 lb (68 kg)  04/16/20 154 lb 6 oz (70 kg)  04/06/19 154 lb 6.4 oz (70 kg)     Risk Assessment/Calculations:     Objective:    Vital Signs:  Ht 5\' 1"  (1.549 m)   Wt 150 lb (68 kg)   LMP 08/19/2010   BMI 28.34 kg/m    VITAL SIGNS:  reviewed GEN:  no acute distress RESPIRATORY:  normal respiratory effort, no increased work of breathing NEURO:  alert and oriented x 3, speech normal PSYCH:  normal affect   ASSESSMENT & PLAN:    1. Chest pain, unspecified type   2. Hyperlipidemia, unspecified hyperlipidemia type    Chest pain - no recurrence. No CAD by CCTA.   HLD - recheck with PCP and forward results to our office, we will review and can help with any recommendations needed.  COVID-19 Education: The signs and symptoms of COVID-19 were discussed with the patient and how to seek care for testing (follow up with PCP or arrange E-visit).  The importance of social  distancing was discussed today.  Time:   Today, I have spent 5 minutes with the patient with telehealth technology discussing the above problems.     Medication Adjustments/Labs and Tests Ordered: Current medicines are reviewed at length with the patient today.  Concerns regarding medicines are outlined above.   Patient Instructions  Medication Instructions:  No Changes In Medications at this time.  *If you need a refill on your cardiac medications before your next appointment, please call your pharmacy*  Lab Work: [place] PROVIDER IN THE NEXT 1-2 MONTHS AND SEND RESULTS TO OUR OFFICE If you have labs (blood work) drawn today and your tests are completely normal, you will receive your results only by: Marland Kitchen MyChart Message (if you have  MyChart) OR . A paper copy in the mail If you have any lab test that is abnormal or we need to change your treatment, we will call you to review the results.  Testing/Procedures: None Ordered At This Time.   Follow-Up: At [facility], you and your health needs are our priority.  As part of our continuing mission to provide you with exceptional heart care, we have created designated Provider Care Teams.  These Care Teams include your primary Cardiologist (physician) and Advanced Practice Providers (APPs -  Physician Assistants and Nurse Practitioners) who all work together to provide you with the care you need, when you need it.  Your next appointment:   AS NEEDED  The format for your next appointment:   In Person  Provider:   Cherlynn Kaiser, MD    Signed, Elouise Munroe, MD  05/08/2020 8:41 AM    [place]

## 2020-06-09 ENCOUNTER — Other Ambulatory Visit: Payer: Self-pay | Admitting: Family Medicine

## 2020-06-11 NOTE — Telephone Encounter (Signed)
Pt scheduled for 07/10/20.

## 2020-06-11 NOTE — Telephone Encounter (Signed)
Pharmacy requests refill on: Duloxetine HCL DR 60 & 30 mg   LAST REFILL: 03/15/2020 LAST OV: 02/22/2019 NEXT OV: Not Scheduled  PHARMACY: [facility] [place], [place]

## 2020-06-11 NOTE — Telephone Encounter (Signed)
Sent. Thanks.   

## 2020-07-01 ENCOUNTER — Encounter

## 2020-07-02 MED ORDER — FLUTICASONE PROPIONATE 50 MCG/ACT NA SUSP
50 MCG/ACT | NASAL | 0 refills | Status: DC
Start: 2020-07-02 — End: 2020-07-25

## 2020-07-02 MED ORDER — FAMCICLOVIR 500 MG PO TABS
500 MG | ORAL_TABLET | ORAL | 0 refills | Status: DC
Start: 2020-07-02 — End: 2020-08-28

## 2020-07-04 ENCOUNTER — Encounter

## 2020-07-04 MED ORDER — BUPROPION HCL ER (XL) 300 MG PO TB24
300 MG | ORAL_TABLET | ORAL | 0 refills | Status: DC
Start: 2020-07-04 — End: 2020-07-30

## 2020-07-04 MED ORDER — IBUPROFEN 800 MG PO TABS
800 MG | ORAL_TABLET | Freq: Three times a day (TID) | ORAL | 0 refills | Status: DC | PRN
Start: 2020-07-04 — End: 2020-08-28

## 2020-07-06 ENCOUNTER — Other Ambulatory Visit: Payer: Self-pay

## 2020-07-06 ENCOUNTER — Other Ambulatory Visit (INDEPENDENT_AMBULATORY_CARE_PROVIDER_SITE_OTHER): Payer: BC Managed Care – PPO

## 2020-07-06 DIAGNOSIS — E785 Hyperlipidemia, unspecified: Secondary | ICD-10-CM

## 2020-07-06 DIAGNOSIS — Z85038 Personal history of other malignant neoplasm of large intestine: Secondary | ICD-10-CM | POA: Diagnosis not present

## 2020-07-06 NOTE — Addendum Note (Signed)
Addended by: Ellamae Sia on: 07/06/2020 12:14 PM   Modules accepted: Orders

## 2020-07-07 LAB — COMPREHENSIVE METABOLIC PANEL
AG Ratio: 1.6 (calc) (ref 1.0–2.5)
ALT: 37 U/L — ABNORMAL HIGH (ref 6–29)
AST: 25 U/L (ref 10–35)
Albumin: 4.2 g/dL (ref 3.6–5.1)
Alkaline phosphatase (APISO): 71 U/L (ref 37–153)
BUN: 18 mg/dL (ref 7–25)
CO2: 24 mmol/L (ref 20–32)
Calcium: 9.7 mg/dL (ref 8.6–10.4)
Chloride: 103 mmol/L (ref 98–110)
Creat: 0.9 mg/dL (ref 0.50–0.99)
Globulin: 2.6 g/dL (calc) (ref 1.9–3.7)
Glucose, Bld: 87 mg/dL (ref 65–99)
Potassium: 4.1 mmol/L (ref 3.5–5.3)
Sodium: 140 mmol/L (ref 135–146)
Total Bilirubin: 0.5 mg/dL (ref 0.2–1.2)
Total Protein: 6.8 g/dL (ref 6.1–8.1)

## 2020-07-07 LAB — LIPID PANEL
Cholesterol: 200 mg/dL — ABNORMAL HIGH (ref <200)
HDL: 61 mg/dL (ref >=50)
LDL Cholesterol (Calc): 114 mg/dL (calc) — ABNORMAL HIGH
Non-HDL Cholesterol (Calc): 139 mg/dL (calc) — ABNORMAL HIGH (ref <130)
Total CHOL/HDL Ratio: 3.3 (calc) (ref <5.0)
Triglycerides: 142 mg/dL (ref <150)

## 2020-07-07 LAB — CEA: CEA: 1.2 ng/mL

## 2020-07-10 ENCOUNTER — Ambulatory Visit (INDEPENDENT_AMBULATORY_CARE_PROVIDER_SITE_OTHER): Payer: BC Managed Care – PPO | Admitting: Family Medicine

## 2020-07-10 ENCOUNTER — Other Ambulatory Visit: Payer: Self-pay

## 2020-07-10 ENCOUNTER — Encounter: Payer: Self-pay | Admitting: Family Medicine

## 2020-07-10 VITALS — BP 122/74 | HR 96 | Temp 97.8°F | Ht 61.0 in | Wt 157.0 lb

## 2020-07-10 DIAGNOSIS — Z85038 Personal history of other malignant neoplasm of large intestine: Secondary | ICD-10-CM

## 2020-07-10 DIAGNOSIS — Z7189 Other specified counseling: Secondary | ICD-10-CM

## 2020-07-10 DIAGNOSIS — Z91199 Patient's noncompliance with other medical treatment and regimen due to unspecified reason: Secondary | ICD-10-CM

## 2020-07-10 DIAGNOSIS — R945 Abnormal results of liver function studies: Secondary | ICD-10-CM

## 2020-07-10 DIAGNOSIS — R7989 Other specified abnormal findings of blood chemistry: Secondary | ICD-10-CM

## 2020-07-10 DIAGNOSIS — Z0000 Encounter for general adult medical examination without abnormal findings: Secondary | ICD-10-CM | POA: Diagnosis not present

## 2020-07-10 DIAGNOSIS — G43909 Migraine, unspecified, not intractable, without status migrainosus: Secondary | ICD-10-CM | POA: Insufficient documentation

## 2020-07-10 DIAGNOSIS — F411 Generalized anxiety disorder: Secondary | ICD-10-CM

## 2020-07-10 MED ORDER — DULOXETINE HCL 30 MG PO CPEP: 30.0000 mg | ORAL_CAPSULE | ORAL | 3 refills | Status: DC

## 2020-07-10 MED ORDER — DULOXETINE HCL 60 MG PO CPEP: 60.0000 mg | ORAL_CAPSULE | ORAL | 3 refills | Status: DC

## 2020-07-10 NOTE — Progress Notes (Signed)
This visit occurred during the SARS-CoV-2 public health emergency.  Safety protocols were in place, including screening questions prior to the visit, additional usage of staff PPE, and extensive cleaning of exam room while observing appropriate contact time as indicated for disinfecting solutions.  CPE- See plan.  Routine anticipatory guidance given to patient.  See health maintenance.  The possibility exists that previously documented standard health maintenance information may have been brought forward from a previous encounter into this note.  If needed, that same information has been updated to reflect the current situation based on today's encounter.    Tetanus 2015  Flu shot declined. PNA d/w pt Shingles d/w pt.   covid vaccine refused.  Mammogram done in 2021 Defer pap to gyn. D/w pt. She agrees. per Dr. Dellis Filbert DXA not due. D/w pt.  Diet and exercise d/w pt.  Colonoscopy 2018.  She'll call about follow up.  See avs.   Living will d/w pt. Husband designated if patient were incapacitated.  HCV prev neg.  She has h/o anxiety.  Still on cymbalta 90mg  at baseline.  compliant.  No SI/HI.  some days are better than others.  She doesn't enjoy being in crowds.    S/p colectomy.  Most recent CEA neg, d/w pt. No vomiting, no blood in stool.  No abd pain.    S/p covid admission.  Was not vaccinated.  Husband was sick with covid concurrently.  Required infusion/temporary supplemental oxygen.  Did not require ventilation.  LFT elevation improving. No abd pain vomiting, no jaundice.  See avs.  covid vaccine declined.  D/w pt.  She had hair loss with covid, d/w pt. not short of breath.  PMH and SH reviewed  Meds, vitals, and allergies reviewed.   ROS: Per HPI.  Unless specifically indicated otherwise in HPI, the patient denies:  General: fever. Eyes: acute vision changes ENT: sore throat Cardiovascular: chest pain Respiratory: SOB GI: vomiting GU: dysuria Musculoskeletal: acute back  pain Derm: acute rash Neuro: acute motor dysfunction Psych: worsening mood Endocrine: polydipsia Heme: bleeding Allergy: hayfever  GEN: nad, alert and oriented HEENT: ncat NECK: supple w/o LA CV: rrr. PULM: ctab, no inc wob ABD: soft, +bs, not tender. EXT: no edema SKIN: Well-perfused.

## 2020-07-10 NOTE — Patient Instructions (Addendum)
I advise you to get vaccinated as directed.  Recheck LFTs in about 2 months.  Schedule a nonfasting lab visit.  Take care.  Glad to see you. Check with your insurance to see if they will cover the shingles shot. Call about a follow up colonoscopy.   Take care.

## 2020-07-11 DIAGNOSIS — Z91199 Patient's noncompliance with other medical treatment and regimen due to unspecified reason: Secondary | ICD-10-CM | POA: Insufficient documentation

## 2020-07-11 NOTE — Assessment & Plan Note (Signed)
CEA still normal.  She will call about colonoscopy follow-up.

## 2020-07-11 NOTE — Assessment & Plan Note (Signed)
Tetanus 2015  Flu shot declined. PNA d/w pt Shingles d/w pt.   covid vaccine refused.  Mammogram done in 2021 Defer pap to gyn. D/w pt. She agrees. per Dr. Dellis Filbert DXA not due. D/w pt.  Diet and exercise d/w pt.  Colonoscopy 2018.  She'll call about follow up.  See avs.   Living will d/w pt. Husband designated if patient were incapacitated.  HCV prev neg.

## 2020-07-11 NOTE — Assessment & Plan Note (Signed)
No suicidal homicidal intent.  Continue Cymbalta.

## 2020-07-11 NOTE — Assessment & Plan Note (Signed)
Presumed to be related to Covid, improved but not normalized completely.  Recheck in 2 months.  No abdominal symptoms.  No jaundice.

## 2020-07-11 NOTE — Assessment & Plan Note (Signed)
Living will d/w pt.  Husband designated if patient were incapacitated.  

## 2020-07-11 NOTE — Assessment & Plan Note (Signed)
Has persistently refused flu vaccination over the years.  She is aware that this puts her and other people at risk of death/disease from a preventable disease.  Refused Covid vaccine.  She is aware that this puts her and other people at risk of death/disease from a preventable disease.  Despite wide availability of multiple free and effective vaccines, she had previously declined to get vaccinated.  She subsequently developed Covid, had severe enough symptoms requiring hospitalization, and still refuses vaccination.  She does expect care when she becomes ill, but she adamantly refuses to get vaccinated to protect herself or to protect other people.  Discussed.

## 2020-07-25 ENCOUNTER — Encounter

## 2020-07-25 MED ORDER — FLUTICASONE PROPIONATE 50 MCG/ACT NA SUSP
50 MCG/ACT | NASAL | 0 refills | Status: DC
Start: 2020-07-25 — End: 2020-08-28

## 2020-07-27 ENCOUNTER — Other Ambulatory Visit: Payer: Self-pay

## 2020-07-27 ENCOUNTER — Emergency Department (HOSPITAL_COMMUNITY)
Admission: EM | Admit: 2020-07-27 | Discharge: 2020-07-27 | Disposition: A | Discharge status: Home / Self Care | Payer: BC Managed Care – PPO | Attending: Emergency Medicine | Admitting: Emergency Medicine

## 2020-07-27 ENCOUNTER — Encounter

## 2020-07-27 DIAGNOSIS — R079 Chest pain, unspecified: Secondary | ICD-10-CM | POA: Insufficient documentation

## 2020-07-27 DIAGNOSIS — Z8616 Personal history of COVID-19: Secondary | ICD-10-CM | POA: Insufficient documentation

## 2020-07-27 DIAGNOSIS — Z85038 Personal history of other malignant neoplasm of large intestine: Secondary | ICD-10-CM | POA: Insufficient documentation

## 2020-07-27 LAB — CBC
HCT: 40.8 % (ref 36.0–46.0)
Hemoglobin: 13.4 g/dL (ref 12.0–15.0)
MCH: 28.2 pg (ref 26.0–34.0)
MCHC: 32.8 g/dL (ref 30.0–36.0)
MCV: 85.9 fL (ref 80.0–100.0)
Platelets: 252 10*3/uL (ref 150–400)
RBC: 4.75 MIL/uL (ref 3.87–5.11)
RDW: 13.1 % (ref 11.5–15.5)
WBC: 7.1 10*3/uL (ref 4.0–10.5)
nRBC: 0 % (ref 0.0–0.2)

## 2020-07-27 LAB — TROPONIN I (HIGH SENSITIVITY)
Troponin I (High Sensitivity): 4 ng/L (ref <18)
Troponin I (High Sensitivity): 3 ng/L (ref <18)

## 2020-07-27 LAB — BASIC METABOLIC PANEL
Anion gap: 12 (ref 5–15)
BUN: 16 mg/dL (ref 8–23)
CO2: 24 mmol/L (ref 22–32)
Calcium: 9.6 mg/dL (ref 8.9–10.3)
Chloride: 103 mmol/L (ref 98–111)
Creatinine, Ser: 0.91 mg/dL (ref 0.44–1.00)
GFR, Estimated: >60 mL/min (ref >60)
Glucose, Bld: 91 mg/dL (ref 70–99)
Potassium: 4.2 mmol/L (ref 3.5–5.1)
Sodium: 139 mmol/L (ref 135–145)

## 2020-07-27 MED ORDER — OMEPRAZOLE 20 MG PO CPDR: 20.0000 mg | DELAYED_RELEASE_CAPSULE | Freq: Every day | ORAL | 1 refills | Status: DC

## 2020-07-27 NOTE — ED Provider Notes (Signed)
[place] EMERGENCY DEPARTMENT Provider Note   CSN: [id number] Arrival date & time: 07/27/20  0830     History Chief Complaint  Patient presents with  . Chest Pain    Jacqueline Garza is a 60 y.o. female.  HPI Patient reports she has been feeling well lately.  She was at school today walking.  She developed a sudden aching and uncomfortable chest pain in the center of her chest that radiated up towards the right and then into her right jaw.  She did not experience any associated symptoms.  No shortness of breath, no nausea, no lightheadedness.  Symptoms resolved spontaneously within 25 minutes.  He denies any history of exertional chest pain or shortness of breath.  No recent cough fever or chills patient had COVID-pneumonia several months ago but has recovered without symptoms.  No lower extremity swelling or calf pain.  Patient is a non-smoker.  No early coronary artery disease or sudden death in the family.    Past Medical History:  Diagnosis Date  . 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 Basal cell carcinoma (BCC) of face   . Cancer ([place])    basal cell carcinoma / facial area and back / right leg   . Colon cancer ([place])   . COVID   . Depression   . Dysplastic nevus of left lower extremity 11/06/2011  . Edema    with occ HCTZ use  . GERD (gastroesophageal reflux disease)   . History of colon cancer   . History of hiatal hernia   . Rheumatoid arthritis(714.0)    with prev MTX intolerance; per Dr. [facility]  . Wears glasses     Patient Active Problem List   Diagnosis Date Noted  . Medically noncompliant 07/11/2020  . Migraine headache 07/10/2020  . HLD (hyperlipidemia) 01/25/2017  . History of colon cancer 09/03/2015  . S/P colectomy 08/14/2015  . Advance care planning 01/23/2014  . Routine general medical examination at a health care facility 01/23/2014  . Abnormal liver function 12/05/2013  . Anemia 12/05/2013  . Anxiety state  08/30/2013  . 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 Inflammatory arthritis 10/18/2010  . ADVEF, DRUG/MED/BIOL SUBST, OTHER DRUG NOS 05/18/2007    Past Surgical History:  Procedure Laterality Date  . CESAREAN SECTION    . COLON SURGERY    . COLONOSCOPY    . colonscopy      removed polyps  . LAPAROSCOPIC SIGMOID COLECTOMY N/A 08/14/2015   Procedure: LAPAROSCOPIC ENTEROLYSIS AND MOBILIZATION OF SPLENIC FLEXURE; SIGMOID COLECTOMY WITH PRIMARY HAND SEWN ANASTOMOSIS;  ENDING LAPAROSCOPY AND INJECTION OF EXPAREL;  Surgeon: Johnathan Hausen, MD;  Location: [facility];  Service: General;  Laterality: N/A;  . TUBAL LIGATION       OB History   No obstetric history on file.     Family History  Problem Relation Age of Onset  . Hypertension Father   . Leukemia Sister   . Breast cancer Cousin   . Colon cancer Neg Hx   . Esophageal cancer Neg Hx   . Rectal cancer Neg Hx   . Stomach cancer Neg Hx     Social History   Tobacco Use  . Smoking status: Never Smoker  . Smokeless tobacco: Never Used  Substance Use Topics  . Alcohol use: Not Currently    Alcohol/week: 0.0 standard drinks    Comment: rarely   . Drug use: No    Home Medications  Prior to Admission medications   Medication Sig Start Date End Date Taking? Authorizing Provider  DULoxetine (CYMBALTA) 30 MG capsule Take 1 capsule (30 mg total) by mouth See admin instructions. Take 1 capsule (30 mg) combine with 1 capsule (60 mg) to make (90 mg totally) by mouth every day 07/10/20  Yes Tonia Ghent, MD  DULoxetine (CYMBALTA) 60 MG capsule Take 1 capsule (60 mg total) by mouth See admin instructions. Take 1 capsule (60 mg) combine with 1 capsule (30 mg) to make (90 mg totally) by mouth every day 07/10/20  Yes Tonia Ghent, MD  omeprazole (PRILOSEC) 20 MG capsule Take 1 capsule (20 mg total) by mouth daily. 07/27/20  Yes Charlesetta Shanks, MD  conjugated estrogens (PREMARIN) vaginal cream Place vaginally 2 (two) times a  week. Patient not taking: No sig reported 07/27/17   Princess Bruins, MD    Allergies    Hydroxychloroquine, Methotrexate derivatives, Sulfa antibiotics, and Tessalon [benzonatate]  Review of Systems   Review of Systems 10 systems reviewed and negative except as per HPI Physical Exam Updated Vital Signs BP (!) 139/116   Pulse 70   Temp 98.2 F (36.8 C) (Oral)   Resp 14   LMP 08/19/2010   SpO2 100%   Physical Exam Constitutional:      Appearance: She is well-developed and well-nourished.  HENT:     Head: Normocephalic and atraumatic.  Eyes:     Extraocular Movements: EOM normal.     Pupils: Pupils are equal, round, and reactive to light.  Cardiovascular:     Rate and Rhythm: Normal rate and regular rhythm.     Pulses: Intact distal pulses.     Heart sounds: Normal heart sounds.  Pulmonary:     Effort: Pulmonary effort is normal.     Breath sounds: Normal breath sounds.  Abdominal:     General: Bowel sounds are normal. There is no distension.     Palpations: Abdomen is soft.     Tenderness: There is no abdominal tenderness.  Musculoskeletal:        General: No swelling, tenderness or edema. Normal range of motion.     Cervical back: Neck supple.     Right lower leg: No edema.     Left lower leg: No edema.  Skin:    General: Skin is warm, dry and intact.  Neurological:     Mental Status: She is alert and oriented to person, place, and time.     GCS: GCS eye subscore is 4. GCS verbal subscore is 5. GCS motor subscore is 6.     Coordination: Coordination normal.     Deep Tendon Reflexes: Strength normal.  Psychiatric:        Mood and Affect: Mood and affect normal.     ED Results / Procedures / Treatments   Labs (all labs ordered are listed, but only abnormal results are displayed) Labs Reviewed  BASIC METABOLIC PANEL  CBC  TROPONIN I (HIGH SENSITIVITY)  TROPONIN I (HIGH SENSITIVITY)    EKG EKG Interpretation  Date/Time:  Friday July 27 2020  08:34:24 EST Ventricular Rate:  73 PR Interval:  136 QRS Duration: 92 QT Interval:  370 QTC Calculation: 407 R Axis:   66 Text Interpretation: Normal sinus rhythm no ischemic appearance, no change from ols Confirmed by Charlesetta Shanks [phone]) on 07/27/2020 5:05:13 PM   Radiology No results found.  Procedures Procedures (including critical care time)  Medications Ordered in ED Medications - No data to  display  ED Course  I have reviewed the triage vital signs and the nursing notes.  Pertinent labs & imaging results that were available during my care of the patient were reviewed by me and considered in my medical decision making (see chart for details).    MDM Rules/Calculators/A&P                         Patient episode of chest pain this morning.  Spontaneously resolved.  No associated symptoms.  Patient does not appear to have high risk for coronary artery disease.  Patient typically does not have hypertension.  There is a documented pressure of 139/116.  I repeated this in the room and repeat was 140s/80s.  Patient reports she does have reflux but does not regularly take any medications for this.  Will recommend 2 weeks of omeprazole.  Recommend follow-up with PCP for evaluation for outpatient stress testing.  Return precautions reviewed. Final Clinical Impression(s) / ED Diagnoses Final diagnoses:  Nonspecific chest pain    Rx / DC Orders ED Discharge Orders         Ordered    omeprazole (PRILOSEC) 20 MG capsule  Daily        07/27/20 1714           Charlesetta Shanks, MD 07/27/20 1718

## 2020-07-27 NOTE — ED Notes (Signed)
Patient verbalizes understanding of discharge instructions. Opportunity for questioning and answers were provided. Pt discharged from ED. 

## 2020-07-27 NOTE — ED Triage Notes (Signed)
Pt here with reports of chest pain/back pain radiating to R jaw this morning. Pt states episode lasted approx 25 minutes.

## 2020-07-27 NOTE — Discharge Instructions (Signed)
1.  Schedule follow-up appointment with your doctor.  Discuss outpatient stress testing.  Return to the emergency department if you get chest pain with any other concerning symptoms of shortness of breath, lightheadedness, dizziness, nausea. 2.  Take omeprazole daily for 2 weeks.  Some people experience chest pain with reflux.

## 2020-07-28 ENCOUNTER — Encounter

## 2020-07-30 MED ORDER — BUPROPION HCL ER (XL) 300 MG PO TB24
300 MG | ORAL_TABLET | ORAL | 0 refills | Status: DC
Start: 2020-07-30 — End: 2020-08-28

## 2020-08-11 ENCOUNTER — Encounter

## 2020-08-26 ENCOUNTER — Encounter

## 2020-08-28 ENCOUNTER — Telehealth: Admit: 2020-08-28 | Payer: BLUE CROSS/BLUE SHIELD | Attending: Family Medicine | Primary: Family Medicine

## 2020-08-28 DIAGNOSIS — M8949 Other hypertrophic osteoarthropathy, multiple sites: Secondary | ICD-10-CM

## 2020-08-28 DIAGNOSIS — M159 Polyosteoarthritis, unspecified: Secondary | ICD-10-CM

## 2020-08-28 MED ORDER — IBUPROFEN 800 MG PO TABS
800 MG | ORAL_TABLET | Freq: Three times a day (TID) | ORAL | 0 refills | Status: DC | PRN
Start: 2020-08-28 — End: 2020-10-01

## 2020-08-28 MED ORDER — FLUTICASONE PROPIONATE 50 MCG/ACT NA SUSP
50 MCG/ACT | NASAL | 0 refills | Status: DC
Start: 2020-08-28 — End: 2021-07-01

## 2020-08-28 MED ORDER — BUPROPION HCL ER (XL) 300 MG PO TB24
300 MG | ORAL_TABLET | ORAL | 0 refills | Status: DC
Start: 2020-08-28 — End: 2020-12-03

## 2020-08-28 MED ORDER — FAMCICLOVIR 500 MG PO TABS
500 MG | ORAL_TABLET | ORAL | 1 refills | Status: DC
Start: 2020-08-28 — End: 2021-03-01

## 2020-08-28 NOTE — Progress Notes (Signed)
SUBJECTIVE  Jane Romero is a 60 y.o. female.    HPI/Chief C/O:  Chief Complaint   Patient presents with   ??? Medication Refill   ??? Other     would like labs ordered     Allergies   Allergen Reactions   ??? Codeine Itching   TeleMedicine Video Visit    Danielle Mink, was evaluated through a synchronous (real-time) audio-video encounter. The patient (or guardian if applicable) is aware that this is a billable service., which includes applicable co-pays.  This virtual visit was conducted with the patient's  (and/or legal guardian's) consent.  The visit was conducted pursuant to the emergency declaration under the [employer] and the [employer], 1135 waiver authority and the [employer] Act.  Patient identification was verified, and a caregiver was present when appropriate. The patient was located in a state where the provider was licensed to provide care.     Patient identification was verified at the start of the visit, including the patient's telephone number and physical location. I discussed with the patient the nature of our telehealth visits, that:     Due to the nature of an audio- video modality, the only components of a physical exam that could be done are the elements supported by direct observation.  I would evaluate the patient and recommend diagnostics and treatments based on my assessment.  If it was felt that the patient should be evaluated in clinic or an emergency room setting, then they would be directed there.  Our sessions are not being recorded and that personal health information is protected.  Our team would provide follow up care in person if/when the patient needs it.           Patient's location: home address in [place]  Physician  location home address in [place] other people involved in call  Are none      On this date 08/28/2020 I have spent 30  minutes reviewing previous notes, test results and face to face (virtual) with  the patient discussing the diagnosis and importance of compliance with the treatment plan as well as documenting on the day of the visit. ,     This visit was completed virtually using [email or web address]  The patient is here for a medication list and treatment planning review  We will go over our care planning goals as well as take care of all refills  We will set up labs as well           Hypertension  Associated symptoms include anxiety and malaise/fatigue. Pertinent negatives include no blurred vision, chest pain, headaches, neck pain, orthopnea, palpitations, peripheral edema, PND, shortness of breath or sweats. Risk factors for coronary artery disease include post-menopausal state and stress. Past treatments include lifestyle changes. The current treatment provides significant improvement. Compliance problems include psychosocial issues, exercise and diet.  There is no history of angina, kidney disease, CAD/MI, CVA, heart failure, left ventricular hypertrophy, PVD or retinopathy. There is no history of chronic renal disease, coarctation of the aorta, hyperaldosteronism, hypercortisolism, hyperparathyroidism, a hypertension causing med, pheochromocytoma, renovascular disease, sleep apnea or a thyroid problem.       ROS:  Review of Systems   Constitutional: Positive for malaise/fatigue. Negative for activity change, appetite change and unexpected weight change.   HENT: Negative for dental problem, drooling, ear discharge, facial swelling, hearing loss, mouth sores, nosebleeds, postnasal drip, rhinorrhea, sinus pain, tinnitus, trouble swallowing and voice change.  Eyes: Negative for blurred vision, photophobia, pain, discharge, redness, itching and visual disturbance.   Respiratory: Negative.  Negative for apnea, choking, chest tightness, shortness of breath, wheezing and stridor.    Cardiovascular: Negative.  Negative for chest pain, palpitations, orthopnea, leg swelling and PND.   Gastrointestinal: Negative.  Negative  for abdominal distention, anal bleeding, blood in stool, constipation and diarrhea.   Endocrine: Negative.  Negative for cold intolerance, heat intolerance, polydipsia, polyphagia and polyuria.   Genitourinary: Negative.  Negative for decreased urine volume, difficulty urinating, dyspareunia, dysuria, enuresis, flank pain, frequency, genital sores, hematuria and urgency.   Musculoskeletal: Negative for back pain, gait problem, neck pain and neck stiffness.   Skin: Negative.  Negative for color change, pallor and wound.   Allergic/Immunologic: Negative.  Negative for environmental allergies, food allergies and immunocompromised state.   Neurological: Negative.  Negative for dizziness, tremors, seizures, syncope, facial asymmetry, speech difficulty, light-headedness and headaches.   Hematological: Negative.  Negative for adenopathy. Does not bruise/bleed easily.   Psychiatric/Behavioral: Negative.  Negative for agitation, behavioral problems, confusion, decreased concentration, dysphoric mood, hallucinations, self-injury, sleep disturbance and suicidal ideas. The patient is not nervous/anxious and is not hyperactive.         Past Medical/Surgical Hx;  Reviewed with patient  History reviewed. No pertinent past medical history.  History reviewed. No pertinent surgical history.    Past Family Hx:  Reviewed with patient  History reviewed. No pertinent family history.    Social Hx:  Reviewed with patient  Social History     Tobacco Use   ??? Smoking status: Former Smoker     Packs/day: 0.25     Years: 15.00     Pack years: 3.75     Types: Cigarettes     Start date: 08/23/1974     Quit date: 04/30/2020     Years since quitting: 0.3   ??? Smokeless tobacco: Never Used   Substance Use Topics   ??? Alcohol use: No       OBJECTIVE  There were no vitals taken for this visit.    Problem List:  Keila does not have any pertinent problems on file.    PHYS EX:  General: Awake/Alert/Oriented/No Acute Distress      ASSESSMENT/PLAN  Khaleah was  seen today for medication refill and other.    Diagnoses and all orders for this visit:    Primary osteoarthritis involving multiple joints  -     ibuprofen (ADVIL;MOTRIN) 800 MG tablet; Take 1 tablet by mouth every 8 hours as needed for Pain . Further refill requires appointment.  -     Comprehensive Metabolic Panel; Future  -     CBC Auto Differential; Future  Long talk on treatment and prevention  Literature is given   --stable on current care planning  -- continue treatment as we are meeting goals       Depression, unspecified depression type  -     buPROPion (WELLBUTRIN XL) 300 MG extended release tablet; TAKE 1 TABLET BY MOUTH EVERY DAY IN THE MORNING. Further refill requires appointment.  -     Comprehensive Metabolic Panel; Future  -     CBC Auto Differential; Future  Counseling is given for anxiety and depression   All questions were taken and answers are given        Herpes genitalis in women  -     famciclovir (FAMVIR) 500 MG tablet; One a day  -     Comprehensive Metabolic  Panel; Future  -     CBC Auto Differential; Future    Screen for colon cancer  -     Fecal DNA Colorectal cancer screening (Cologuard)  -     Comprehensive Metabolic Panel; Future  -     CBC Auto Differential; Future    IFG (impaired fasting glucose)  -     Comprehensive Metabolic Panel; Future  -     CBC Auto Differential; Future  -     Hemoglobin A1C; Future    Dyslipidemia  -     Comprehensive Metabolic Panel; Future  -     Lipid Panel; Future  -     CBC Auto Differential; Future  --Mediterranean diet, exercise, weight loss, vitamins    We have a long talk on cholesterol and importance of lowering it       Fatigue, unspecified type  -     TSH without Reflex; Future  -     Uric Acid; Future  -     Comprehensive Metabolic Panel; Future  -     CBC Auto Differential; Future  Long talk on treatment and prevention  Literature is given           Outpatient Encounter Medications as of 08/28/2020   Medication Sig Dispense Refill   ???  fluticasone (FLONASE) 50 MCG/ACT nasal spray USE 1 SPRAY BY NASAL ROUTE DAILY. 1 each 0   ??? buPROPion (WELLBUTRIN XL) 300 MG extended release tablet TAKE 1 TABLET BY MOUTH EVERY DAY IN THE MORNING. Further refill requires appointment. 90 tablet 0   ??? ibuprofen (ADVIL;MOTRIN) 800 MG tablet Take 1 tablet by mouth every 8 hours as needed for Pain . Further refill requires appointment. 90 tablet 0   ??? famciclovir (FAMVIR) 500 MG tablet One a day 90 tablet 1   ??? [DISCONTINUED] buPROPion (WELLBUTRIN XL) 300 MG extended release tablet TAKE 1 TABLET BY MOUTH EVERY DAY IN THE MORNING. Further refill requires appointment. 15 tablet 0   ??? [DISCONTINUED] ibuprofen (ADVIL;MOTRIN) 800 MG tablet TAKE 1 TABLET BY MOUTH EVERY 8 HOURS AS NEEDED FOR PAIN . FURTHER REFILL REQUIRES APPOINTMENT. 90 tablet 0   ??? [DISCONTINUED] famciclovir (FAMVIR) 500 MG tablet TAKE 1 TABLET BY MOUTH EVERY DAY 30 tablet 0     No facility-administered encounter medications on file as of 08/28/2020.       No follow-ups on file.        Reviewed recent labs related to [facility] current problems      Discussed importance of regular Health Maintenance follow up  Health Maintenance   Topic   ??? Pneumococcal 0-64 years Vaccine (1 of 2 - PPSV23)   ??? Depression Monitoring    ??? HIV screen    ??? DTaP/Tdap/Td vaccine (1 - Tdap)   ??? A1C test (Diabetic or Prediabetic)    ??? Colon cancer screen fecal DNA test (Cologuard)    ??? Breast cancer screen    ??? Cervical cancer screen    ??? Lipid screen    ??? Flu vaccine    ??? Shingles Vaccine    ??? COVID-19 Vaccine    ??? Hepatitis C screen    ??? Hepatitis A vaccine    ??? Hepatitis B vaccine    ??? Hib vaccine    ??? Meningococcal (ACWY) vaccine

## 2020-09-07 ENCOUNTER — Other Ambulatory Visit: Payer: Self-pay

## 2020-09-07 ENCOUNTER — Other Ambulatory Visit (INDEPENDENT_AMBULATORY_CARE_PROVIDER_SITE_OTHER): Payer: BC Managed Care – PPO

## 2020-09-07 DIAGNOSIS — R7989 Other specified abnormal findings of blood chemistry: Secondary | ICD-10-CM

## 2020-09-07 NOTE — Addendum Note (Signed)
Addended by: Ellamae Sia on: 09/07/2020 02:49 PM   Modules accepted: Orders

## 2020-09-08 LAB — HEPATIC FUNCTION PANEL
AG Ratio: 1.9 (calc) (ref 1.0–2.5)
ALT: 32 U/L — ABNORMAL HIGH (ref 6–29)
AST: 25 U/L (ref 10–35)
Albumin: 4.5 g/dL (ref 3.6–5.1)
Alkaline phosphatase (APISO): 79 U/L (ref 37–153)
Bilirubin, Direct: 0.1 mg/dL (ref 0.0–0.2)
Globulin: 2.4 g/dL (calc) (ref 1.9–3.7)
Indirect Bilirubin: 0.3 mg/dL (calc) (ref 0.2–1.2)
Total Bilirubin: 0.4 mg/dL (ref 0.2–1.2)
Total Protein: 6.9 g/dL (ref 6.1–8.1)

## 2020-09-09 ENCOUNTER — Other Ambulatory Visit: Payer: Self-pay | Admitting: Family Medicine

## 2020-09-09 DIAGNOSIS — R7989 Other specified abnormal findings of blood chemistry: Secondary | ICD-10-CM

## 2020-09-10 LAB — HUMAN PAPILLOMAVIRUS (HPV) DNA PROBE THIN PREP HIGH RISK
HPV Genotype 16: NOT DETECTED
HPV Type 18: NOT DETECTED
HPV, High Risk Other: NOT DETECTED

## 2020-09-22 LAB — FECAL DNA COLORECTAL CANCER SCREENING (COLOGUARD): FIT-DNA (Cologuard): NEGATIVE

## 2020-09-29 ENCOUNTER — Encounter

## 2020-10-01 MED ORDER — IBUPROFEN 800 MG PO TABS
800 MG | ORAL_TABLET | Freq: Three times a day (TID) | ORAL | 0 refills | Status: DC | PRN
Start: 2020-10-01 — End: 2021-07-01

## 2020-10-30 NOTE — Telephone Encounter (Signed)
Received call from [facility] with [employer] Complaint.    Subjective: Caller states "I put a moisturizer on my face and I woke up the next day and it was swollen."     Current Symptoms: Bilateral cheek swelling, bilateral eye swelling    Onset: Sunday morning    Associated Symptoms: Stings on face, "feels like sunburn"    Pain Severity: Mild Stinging    Temperature: Denies    What has been tried: Benadryl    Recommended disposition: See in Office Today    Care advice provided, patient verbalizes understanding; denies any other questions or concerns; instructed to call back for any new or worsening symptoms.    Patient/Caller agrees with recommended disposition; Clinical research associate provided warm transfer to [facility] [employer] [facility] for appointment scheduling     Attention Provider:  Thank you for allowing me to participate in the care of your patient.  The patient was connected to triage in response to information provided to the ECC/PSC.  Please do not respond through this encounter as the response is not directed to a shared pool.    Reason for Disposition  ??? Swelling is painful to touch    Protocols used: [facility]

## 2020-11-20 ENCOUNTER — Encounter: Payer: Self-pay | Admitting: Family Medicine

## 2020-11-21 MED ORDER — DULOXETINE HCL 30 MG PO CPEP: 30.0000 mg | ORAL_CAPSULE | ORAL | 3 refills | Status: DC

## 2020-11-21 MED ORDER — DULOXETINE HCL 60 MG PO CPEP: 60.0000 mg | ORAL_CAPSULE | ORAL | 3 refills | Status: DC

## 2020-12-02 ENCOUNTER — Encounter

## 2020-12-03 MED ORDER — BUPROPION HCL ER (XL) 300 MG PO TB24
300 MG | ORAL_TABLET | ORAL | 0 refills | Status: DC
Start: 2020-12-03 — End: 2021-02-26

## 2020-12-03 NOTE — Telephone Encounter (Signed)
Last Appointment   08/28/2020  Next Appointment  Visit date not found

## 2021-01-29 ENCOUNTER — Encounter: Payer: Self-pay | Admitting: Family Medicine

## 2021-02-13 LAB — HM MAMMOGRAPHY

## 2021-02-24 ENCOUNTER — Encounter

## 2021-02-25 NOTE — Telephone Encounter (Signed)
Last seen 08/28/2020  Next appt Visit date not found

## 2021-02-26 MED ORDER — BUPROPION HCL ER (XL) 300 MG PO TB24
300 MG | ORAL_TABLET | ORAL | 0 refills | Status: DC
Start: 2021-02-26 — End: 2021-05-21

## 2021-02-28 ENCOUNTER — Encounter

## 2021-03-01 MED ORDER — FAMCICLOVIR 500 MG PO TABS
500 MG | ORAL_TABLET | ORAL | 1 refills | Status: DC
Start: 2021-03-01 — End: 2021-07-01

## 2021-03-01 NOTE — Telephone Encounter (Signed)
Last seen 08/28/2020  Next appt Visit date not found

## 2021-05-21 ENCOUNTER — Encounter

## 2021-05-21 MED ORDER — BUPROPION HCL ER (XL) 300 MG PO TB24
300 MG | ORAL_TABLET | ORAL | 0 refills | Status: DC
Start: 2021-05-21 — End: 2021-07-01

## 2021-05-21 NOTE — Telephone Encounter (Signed)
Last seen 08/28/2020  Next appt Visit date not found

## 2021-06-19 IMAGING — CT CT HEAR MORPH WITH CTA COR WITH SCORE WITH CA WITH CONTRAST AND
4 of 7 series · 8 of 20 positions shown, 9 images · IV contrast (APPLIED)
Comparison: None.
COMPARISON: None.

Addendum:
EXAM:
OVER-READ INTERPRETATION  CT CHEST

The following report is an over-read performed by radiologist Dr.
Alv Henning Majormoen [REDACTED] on 03/07/2019. This over-read
does not include interpretation of cardiac or coronary anatomy or
pathology. The coronary CTA interpretation by the cardiologist is
attached.
CLINICAL DATA: 57-year-old female with h/o hyperlipidemia and
atypical chest pain.
Cardiac/Coronary  CTA
TECHNIQUE: The patient was scanned on a [employer] Force scanner.

[Series 6: best diast 76 % · axial · 0.36mm/px · z∈[+1065,+1106]mm · 2 of 311 slices shown, 3 images]
[im 104/311  vessel]
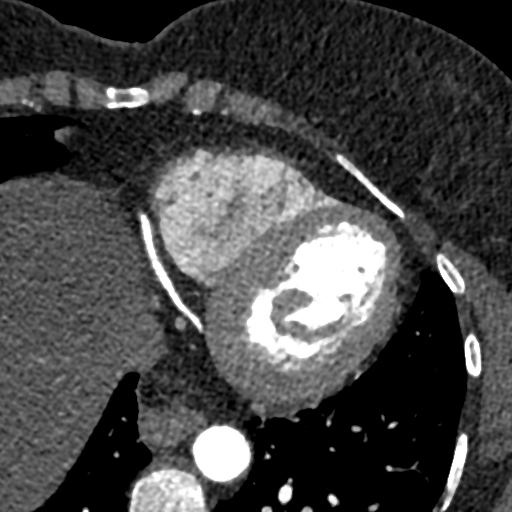
[im 104/311  lung]
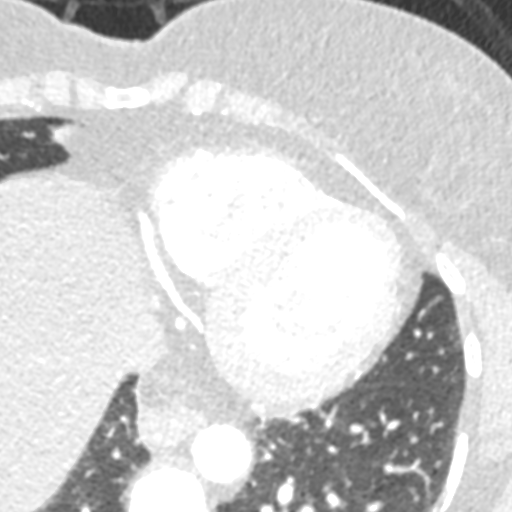
[im 207/311  vessel]
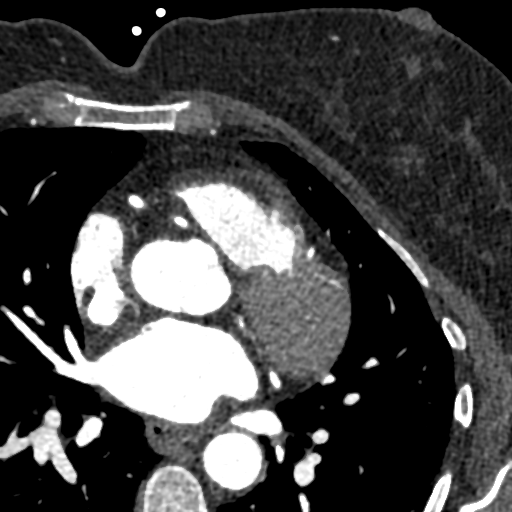

[Series 7: best syst 44 % · axial · 0.36mm/px · z∈[+1065,+1106]mm · 2 of 311 slices shown]
[im 104/311  vessel]
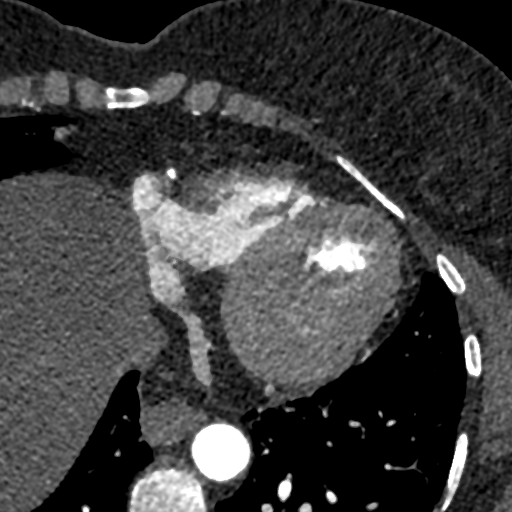
[im 207/311  vessel]
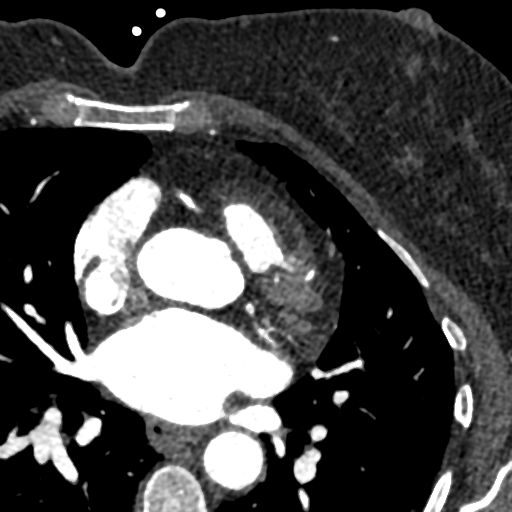

[Series 8: ts diast sharp 76 % · axial · 0.36mm/px · z∈[+1065,+1106]mm · 2 of 311 slices shown]
[im 104/311  lung]
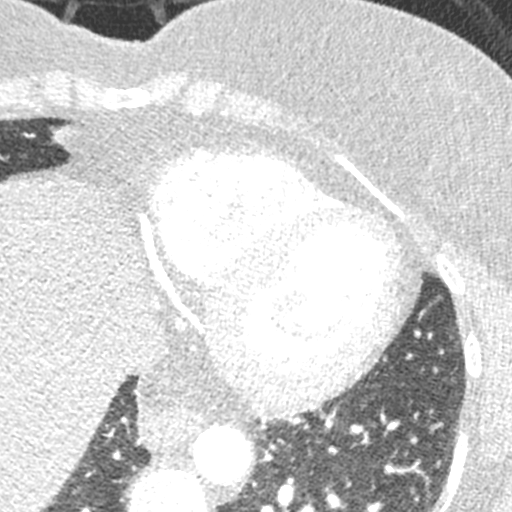
[im 207/311  lung]
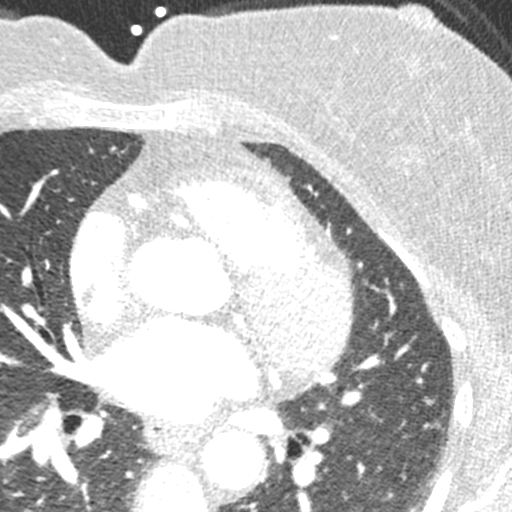

[Series 9: ts syst sharp 44 % · axial · 0.36mm/px · z∈[+1065,+1106]mm · 2 of 311 slices shown]
[im 104/311  lung]
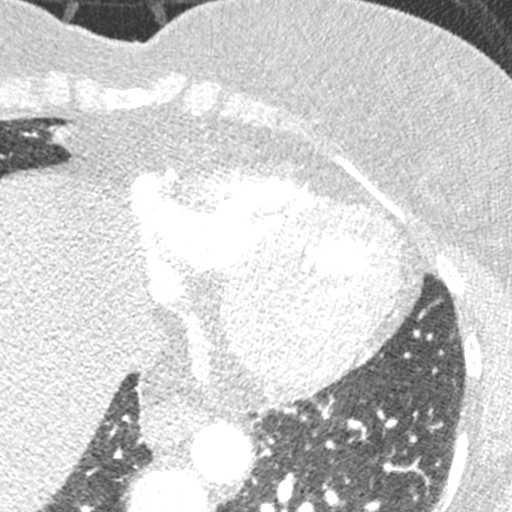
[im 207/311  lung]
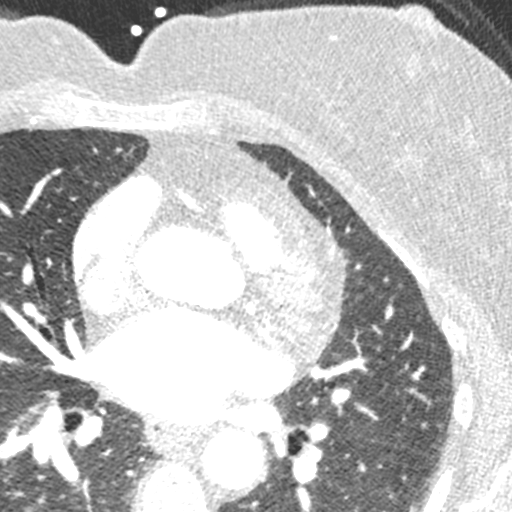

[8 of 20 positions shown; findings below may reference images not displayed]

FINDINGS: Vascular: Heart is normal size.  Aorta is normal caliber.

Mediastinum/Nodes: No adenopathy in the lower mediastinum or hila.

Lungs/Pleura: Visualized lungs clear.  No effusions.

Upper Abdomen: Fatty infiltration of the liver.  No acute findings.

Musculoskeletal: Chest wall soft tissues are unremarkable. No acute
bony abnormality.
IMPRESSION: No acute extra cardiac abnormality.

Fatty infiltration of the liver.
FINDINGS: A 100 kV prospective scan was triggered in the descending thoracic
aorta at 111 HU's. Axial non-contrast 3 mm slices were carried out
through the heart. The data set was analyzed on a dedicated work
station and scored using the Agatson method. Gantry rotation speed
was 250 msecs and collimation was .6 mm. No beta blockade and 0.8 mg
of sl NTG was given. The 3D data set was reconstructed in 5%
intervals of the 67-82 % of the R-R cycle. Diastolic phases were
analyzed on a dedicated work station using MPR, MIP and VRT modes.
The patient received 80 cc of contrast.

Aorta: Normal size. Mild calcifications in the aortic arch. No
dissection.

Aortic Valve:  Trileaflet.  No calcifications.

Coronary Arteries:  Normal coronary origin.  Right dominance.

RCA is a large dominant artery that gives rise to PDA and PLA. There
is no plaque.

Left main is a large artery that gives rise to LAD and LCX arteries.
Left main has no plaque.

LAD is a large vessel that gives rise to two diagonal arteries and
has no plaque.

LCX is a non-dominant artery that gives rise to one large OM1
branch. There is no plaque.

Other findings:

Normal pulmonary vein drainage into the left atrium.

Normal left atrial appendage without a thrombus.

Normal size of the pulmonary artery.
IMPRESSION: 1. Coronary calcium score of 0. This was 0 percentile for age and
sex matched control.

2. Normal coronary origin with right dominance.

3. No evidence of CAD.

CAD-RADS 0. No evidence of CAD (0%). Consider non-atherosclerotic
causes of chest pain.

*** End of Addendum ***
EXAM:
OVER-READ INTERPRETATION  CT CHEST

The following report is an over-read performed by radiologist Dr.
Alv Henning Majormoen [REDACTED] on 03/07/2019. This over-read
does not include interpretation of cardiac or coronary anatomy or
pathology. The coronary CTA interpretation by the cardiologist is
attached.
FINDINGS: Vascular: Heart is normal size.  Aorta is normal caliber.

Mediastinum/Nodes: No adenopathy in the lower mediastinum or hila.

Lungs/Pleura: Visualized lungs clear.  No effusions.

Upper Abdomen: Fatty infiltration of the liver.  No acute findings.

Musculoskeletal: Chest wall soft tissues are unremarkable. No acute
bony abnormality.
IMPRESSION: No acute extra cardiac abnormality.

Fatty infiltration of the liver.

## 2021-07-01 ENCOUNTER — Encounter
Admit: 2021-07-01 | Discharge: 2021-07-01 | Payer: BLUE CROSS/BLUE SHIELD | Attending: Family Medicine | Primary: Family Medicine

## 2021-07-01 DIAGNOSIS — M7711 Lateral epicondylitis, right elbow: Secondary | ICD-10-CM

## 2021-07-01 MED ORDER — DICLOFENAC SODIUM 1 % EX GEL
1 % | Freq: Four times a day (QID) | CUTANEOUS | 3 refills | Status: AC
Start: 2021-07-01 — End: 2023-05-19

## 2021-07-01 MED ORDER — IBUPROFEN 800 MG PO TABS
800 MG | ORAL_TABLET | Freq: Three times a day (TID) | ORAL | 0 refills | Status: DC | PRN
Start: 2021-07-01 — End: 2022-07-03

## 2021-07-01 MED ORDER — BUPROPION HCL ER (XL) 300 MG PO TB24
300 MG | ORAL_TABLET | ORAL | 1 refills | Status: DC
Start: 2021-07-01 — End: 2022-07-03

## 2021-07-01 MED ORDER — FAMCICLOVIR 500 MG PO TABS
500 MG | ORAL_TABLET | ORAL | 1 refills | Status: AC
Start: 2021-07-01 — End: 2022-07-03

## 2021-07-01 MED ORDER — FLUTICASONE PROPIONATE 50 MCG/ACT NA SUSP
50 MCG/ACT | NASAL | 2 refills | Status: AC
Start: 2021-07-01 — End: 2023-05-19

## 2021-07-01 NOTE — Progress Notes (Signed)
SUBJECTIVE  Jane Romero is a 60 y.o. female.    HPI/Chief C/O:  Chief Complaint   Patient presents with    Annual Exam     Pt here for a physical.     Medication Refill     Pharmacy confirmed, meds pended.     Allergies   Allergen Reactions    Codeine Itching   The patient is here for a medication list and treatment planning review  We will go over our care planning goals as well as take care of all refills  We will set up labs as well      C/O pain to her right elbow     Hypertension  Associated symptoms include anxiety and malaise/fatigue. Pertinent negatives include no blurred vision, chest pain, headaches, neck pain, orthopnea, palpitations, peripheral edema, PND, shortness of breath or sweats. Risk factors for coronary artery disease include post-menopausal state and stress. Past treatments include lifestyle changes. The current treatment provides significant improvement. Compliance problems include psychosocial issues, exercise and diet.  There is no history of angina, kidney disease, CAD/MI, CVA, heart failure, left ventricular hypertrophy, PVD or retinopathy. There is no history of chronic renal disease, coarctation of the aorta, hyperaldosteronism, hypercortisolism, hyperparathyroidism, a hypertension causing med, pheochromocytoma, renovascular disease, sleep apnea or a thyroid problem.       ROS:  Review of Systems   Constitutional:  Positive for malaise/fatigue. Negative for activity change, appetite change, chills, diaphoresis, fatigue, fever and unexpected weight change.   HENT: Negative.  Negative for congestion, dental problem, drooling, ear discharge, ear pain, facial swelling, hearing loss, mouth sores, nosebleeds, postnasal drip, rhinorrhea, sinus pressure, sinus pain, sneezing, sore throat, tinnitus, trouble swallowing and voice change.    Eyes:  Negative for blurred vision, photophobia, pain, discharge, redness, itching and visual disturbance.   Respiratory: Negative.  Negative for apnea,  cough, choking, chest tightness, shortness of breath, wheezing and stridor.    Cardiovascular: Negative.  Negative for chest pain, palpitations, orthopnea, leg swelling and PND.   Gastrointestinal: Negative.  Negative for abdominal distention, abdominal pain, anal bleeding, blood in stool, constipation, diarrhea, nausea, rectal pain and vomiting.   Endocrine: Negative.  Negative for cold intolerance, heat intolerance, polydipsia, polyphagia and polyuria.   Genitourinary: Negative.  Negative for decreased urine volume, difficulty urinating, dyspareunia, dysuria, enuresis, flank pain, frequency, genital sores, hematuria and urgency.   Musculoskeletal:  Positive for arthralgias (right elbow pain). Negative for back pain, gait problem, joint swelling, myalgias, neck pain and neck stiffness.   Skin: Negative.  Negative for color change, pallor, rash and wound.   Allergic/Immunologic: Negative.  Negative for environmental allergies, food allergies and immunocompromised state.   Neurological: Negative.  Negative for dizziness, tremors, seizures, syncope, facial asymmetry, speech difficulty, weakness, light-headedness, numbness and headaches.   Hematological: Negative.  Negative for adenopathy. Does not bruise/bleed easily.   Psychiatric/Behavioral:  Positive for dysphoric mood. Negative for agitation, behavioral problems, confusion, decreased concentration, hallucinations, self-injury, sleep disturbance and suicidal ideas. The patient is nervous/anxious. The patient is not hyperactive.       Past Medical/Surgical Hx;  Reviewed with patient  History reviewed. No pertinent past medical history.  History reviewed. No pertinent surgical history.    Past Family Hx:  Reviewed with patient  History reviewed. No pertinent family history.    Social Hx:  Reviewed with patient  Social History     Tobacco Use    Smoking status: Former     Packs/day: 0.25  Years: 15.00     Pack years: 3.75     Types: Cigarettes     Start date:  08/23/1974     Quit date: 04/30/2020     Years since quitting: 1.1    Smokeless tobacco: Never   Substance Use Topics    Alcohol use: No       OBJECTIVE  BP 132/84    Pulse 75    Temp 97.8 ??F (36.6 ??C) (Temporal)    Resp 18    Ht  (1.676 m)    Wt 116 lb (52.6 kg)    SpO2 98%    Breastfeeding No    BMI 18.72 kg/m??     Problem List:  Jane Romero does not have any pertinent problems on file.    PHYS EX:  Physical Exam  Vitals and nursing note reviewed.   Constitutional:       General: She is not in acute distress.     Appearance: Normal appearance. She is well-developed. She is not ill-appearing, toxic-appearing or diaphoretic.   HENT:      Head: Normocephalic and atraumatic.      Right Ear: External ear normal.      Left Ear: External ear normal.      Nose: Nose normal. No congestion or rhinorrhea.      Mouth/Throat:      Mouth: Mucous membranes are moist.      Pharynx: No oropharyngeal exudate or posterior oropharyngeal erythema.   Eyes:      General: Lids are everted, no foreign bodies appreciated. No scleral icterus.        Right eye: No foreign body, discharge or hordeolum.         Left eye: No foreign body, discharge or hordeolum.      Extraocular Movements: Extraocular movements intact.      Right eye: Normal extraocular motion and no nystagmus.      Left eye: Normal extraocular motion and no nystagmus.      Conjunctiva/sclera: Conjunctivae normal.      Right eye: Right conjunctiva is not injected. No chemosis, exudate or hemorrhage.     Left eye: Left conjunctiva is not injected. No chemosis, exudate or hemorrhage.     Pupils: Pupils are equal, round, and reactive to light. Pupils are equal.      Right eye: Pupil is round and reactive.      Left eye: Pupil is round and reactive.   Neck:      Thyroid: No thyromegaly.      Vascular: No carotid bruit or JVD.      Trachea: No tracheal deviation.   Cardiovascular:      Rate and Rhythm: Normal rate and regular rhythm.      Pulses: Normal pulses.      Heart sounds: Normal  heart sounds. No murmur heard.    No friction rub. No gallop.   Pulmonary:      Effort: Pulmonary effort is normal. No respiratory distress.      Breath sounds: Normal breath sounds. No stridor. No wheezing, rhonchi or rales.   Chest:      Chest wall: No tenderness.   Abdominal:      General: Bowel sounds are normal. There is no distension.      Palpations: Abdomen is soft. There is no mass.      Tenderness: There is no abdominal tenderness. There is no right CVA tenderness, left CVA tenderness, guarding or rebound.      Hernia:  No hernia is present.   Musculoskeletal:         General: Tenderness (pain to her right lateral epicondyle) present. No swelling, deformity or signs of injury. Normal range of motion.      Cervical back: Normal range of motion and neck supple. No rigidity. No muscular tenderness.      Right lower leg: No edema.      Left lower leg: No edema.   Lymphadenopathy:      Cervical: No cervical adenopathy.   Skin:     General: Skin is warm.      Coloration: Skin is not jaundiced or pale.      Findings: No bruising, erythema, lesion or rash.   Neurological:      General: No focal deficit present.      Mental Status: She is alert and oriented to person, place, and time.      Cranial Nerves: No cranial nerve deficit.      Sensory: No sensory deficit.      Motor: No weakness or abnormal muscle tone.      Coordination: Coordination normal.      Gait: Gait normal.      Deep Tendon Reflexes: Reflexes are normal and symmetric. Reflexes normal.   Psychiatric:      Comments: Mood is stable       ASSESSMENT/PLAN  Lamiyah was seen today for annual exam and medication refill.    Diagnoses and all orders for this visit:    Lateral epicondylitis of right elbow  --PLAN--inject right lateral epicondyle  x 2                1/2 cc xylocaine plus 1 cc depo medrol                Omt/ultra--Rx        Depression, unspecified depression type  -     buPROPion (WELLBUTRIN XL) 300 MG extended release tablet; Take one a day  -      Comprehensive Metabolic Panel; Future  -     CBC with Auto Differential; Future  Counseling is given for anxiety and depression   All questions were taken and answers are given        Herpes genitalis in women  -     famciclovir (FAMVIR) 500 MG tablet; TAKE 1 TABLET BY MOUTH EVERY DAY  -     Comprehensive Metabolic Panel; Future  -     CBC with Auto Differential; Future    Primary osteoarthritis involving multiple joints  -     ibuprofen (ADVIL;MOTRIN) 800 MG tablet; Take 1 tablet by mouth every 8 hours as needed for Pain  -     Comprehensive Metabolic Panel; Future  -     CBC with Auto Differential; Future    Acute sinusitis, recurrence not specified, unspecified location  -     fluticasone (FLONASE) 50 MCG/ACT nasal spray; USE 1 SPRAY BY NASAL ROUTE DAILY.  -     Comprehensive Metabolic Panel; Future  -     CBC with Auto Differential; Future    Right elbow pain  -     Comprehensive Metabolic Panel; Future  -     CBC with Auto Differential; Future  -     XR ELBOW RIGHT (2 VIEWS); Future    Prediabetes  -     Comprehensive Metabolic Panel; Future  -     CBC with Auto Differential; Future  -  Hemoglobin A1C; Future    Other fatigue  -     TSH; Future  -     Uric Acid; Future  -     Comprehensive Metabolic Panel; Future  -     CBC with Auto Differential; Future    Dyslipidemia  -     Comprehensive Metabolic Panel; Future  -     Lipid Panel; Future  -     CBC with Auto Differential; Future  --Mediterranean diet, exercise, weight loss, vitamins    We have a long talk on cholesterol and importance of lowering it       Other orders  -     diclofenac sodium (VOLTAREN) 1 % GEL; Apply 4 g topically 4 times daily      Outpatient Encounter Medications as of 07/01/2021   Medication Sig Dispense Refill    buPROPion (WELLBUTRIN XL) 300 MG extended release tablet Take one a day 90 tablet 1    famciclovir (FAMVIR) 500 MG tablet TAKE 1 TABLET BY MOUTH EVERY DAY 90 tablet 1    ibuprofen (ADVIL;MOTRIN) 800 MG tablet Take 1 tablet  by mouth every 8 hours as needed for Pain 90 tablet 0    fluticasone (FLONASE) 50 MCG/ACT nasal spray USE 1 SPRAY BY NASAL ROUTE DAILY. 1 each 2    diclofenac sodium (VOLTAREN) 1 % GEL Apply 4 g topically 4 times daily 100 g 3    [DISCONTINUED] buPROPion (WELLBUTRIN XL) 300 MG extended release tablet TAKE 1 TABLET BY MOUTH EVERY DAY IN THE MORNING. FURTHER REFILL REQUIRES APPOINTMENT. 90 tablet 0    [DISCONTINUED] famciclovir (FAMVIR) 500 MG tablet TAKE 1 TABLET BY MOUTH EVERY DAY 90 tablet 1    [DISCONTINUED] ibuprofen (ADVIL;MOTRIN) 800 MG tablet Take 1 tablet by mouth every 8 hours as needed for Pain 90 tablet 0    [DISCONTINUED] fluticasone (FLONASE) 50 MCG/ACT nasal spray USE 1 SPRAY BY NASAL ROUTE DAILY. 1 each 0     No facility-administered encounter medications on file as of 07/01/2021.       No follow-ups on file.        Reviewed recent labs related to [facility] current problems      Discussed importance of regular Health Maintenance follow up  Health Maintenance   Topic    HIV screen     DTaP/Tdap/Td vaccine (1 - Tdap)    A1C test (Diabetic or Prediabetic)     Depression Monitoring     Breast cancer screen     Colorectal Cancer Screen     Cervical cancer screen     Lipids     Flu vaccine     Shingles vaccine     COVID-19 Vaccine     Hepatitis C screen     Hepatitis A vaccine     Hib vaccine     Meningococcal (ACWY) vaccine     Pneumococcal 0-64 years Vaccine

## 2021-08-29 LAB — CBC WITH AUTO DIFFERENTIAL

## 2021-08-29 LAB — LIPID PANEL

## 2021-08-29 LAB — COMPREHENSIVE METABOLIC PANEL

## 2021-08-29 LAB — HEMOGLOBIN A1C

## 2021-08-29 LAB — TSH

## 2021-08-29 LAB — URIC ACID

## 2021-10-11 ENCOUNTER — Encounter

## 2021-10-21 LAB — HM MAMMOGRAPHY

## 2021-10-22 LAB — FECAL BLOOD IMMUNOCHEMICAL TEST: Fecal Blood Immunochemical Test: NEGATIVE

## 2021-10-24 LAB — HUMAN PAPILLOMAVIRUS (HPV) DNA PROBE THIN PREP HIGH RISK
HPV Genotype 16: NOT DETECTED
HPV Type 18: NOT DETECTED
HPV, High Risk Other: NOT DETECTED

## 2022-02-05 ENCOUNTER — Other Ambulatory Visit (INDEPENDENT_AMBULATORY_CARE_PROVIDER_SITE_OTHER): Payer: BC Managed Care – PPO

## 2022-02-05 ENCOUNTER — Telehealth: Payer: Self-pay

## 2022-02-05 ENCOUNTER — Other Ambulatory Visit: Payer: Self-pay | Admitting: Family Medicine

## 2022-02-05 DIAGNOSIS — Z85038 Personal history of other malignant neoplasm of large intestine: Secondary | ICD-10-CM

## 2022-02-05 DIAGNOSIS — D649 Anemia, unspecified: Secondary | ICD-10-CM

## 2022-02-05 DIAGNOSIS — E785 Hyperlipidemia, unspecified: Secondary | ICD-10-CM | POA: Diagnosis not present

## 2022-02-05 NOTE — Addendum Note (Signed)
Addended by: Ria Bush on: 02/05/2022 02:04 PM   Modules accepted: Orders

## 2022-02-05 NOTE — Telephone Encounter (Signed)
Patient has not been seen since 2021. Please call to set up CPE

## 2022-02-05 NOTE — Telephone Encounter (Signed)
Patient has been scheduled

## 2022-02-05 NOTE — Addendum Note (Signed)
Addended by: Ellamae Sia on: 02/05/2022 02:19 PM   Modules accepted: Orders

## 2022-02-05 NOTE — Telephone Encounter (Signed)
Refill request for Cymbalta 60 mg caps  LOV - 07/10/20 Next OV - not scheduled yet but message was sent to scheduling to set up CPE Last refill - 11/21/20 #90/3

## 2022-02-05 NOTE — Telephone Encounter (Signed)
Noted. Thanks.

## 2022-02-05 NOTE — Telephone Encounter (Addendum)
Received request to order CPE labs for patient today, ordered.  Will forward to PCP in case he wants to add anything else.

## 2022-02-05 NOTE — Telephone Encounter (Signed)
Sent. Thanks.   

## 2022-02-06 LAB — LIPID PANEL
Cholesterol: 222 mg/dL — ABNORMAL HIGH (ref <200)
HDL: 63 mg/dL (ref >=50)
LDL Cholesterol (Calc): 127 mg/dL (calc) — ABNORMAL HIGH
Non-HDL Cholesterol (Calc): 159 mg/dL (calc) — ABNORMAL HIGH (ref <130)
Total CHOL/HDL Ratio: 3.5 (calc) (ref <5.0)
Triglycerides: 202 mg/dL — ABNORMAL HIGH (ref <150)

## 2022-02-06 LAB — COMPREHENSIVE METABOLIC PANEL
AG Ratio: 1.7 (calc) (ref 1.0–2.5)
ALT: 25 U/L (ref 6–29)
AST: 22 U/L (ref 10–35)
Albumin: 4.5 g/dL (ref 3.6–5.1)
Alkaline phosphatase (APISO): 70 U/L (ref 37–153)
BUN/Creatinine Ratio: 9 (calc) (ref 6–22)
BUN: 10 mg/dL (ref 7–25)
CO2: 22 mmol/L (ref 20–32)
Calcium: 9.6 mg/dL (ref 8.6–10.4)
Chloride: 104 mmol/L (ref 98–110)
Creat: 1.07 mg/dL — ABNORMAL HIGH (ref 0.50–1.05)
Globulin: 2.7 g/dL (calc) (ref 1.9–3.7)
Glucose, Bld: 113 mg/dL — ABNORMAL HIGH (ref 65–99)
Potassium: 4.2 mmol/L (ref 3.5–5.3)
Sodium: 139 mmol/L (ref 135–146)
Total Bilirubin: 0.5 mg/dL (ref 0.2–1.2)
Total Protein: 7.2 g/dL (ref 6.1–8.1)

## 2022-02-06 LAB — CBC WITH DIFFERENTIAL/PLATELET
Absolute Monocytes: 410 cells/uL (ref 200–950)
Basophils Absolute: 32 cells/uL (ref 0–200)
Basophils Relative: 0.5 %
Eosinophils Absolute: 141 cells/uL (ref 15–500)
Eosinophils Relative: 2.2 %
HCT: 42.6 % (ref 35.0–45.0)
Hemoglobin: 13.9 g/dL (ref 11.7–15.5)
Lymphs Abs: 2150 cells/uL (ref 850–3900)
MCH: 27.4 pg (ref 27.0–33.0)
MCHC: 32.6 g/dL (ref 32.0–36.0)
MCV: 84 fL (ref 80.0–100.0)
MPV: 9.9 fL (ref 7.5–12.5)
Monocytes Relative: 6.4 %
Neutro Abs: 3667 cells/uL (ref 1500–7800)
Neutrophils Relative %: 57.3 %
Platelets: 288 10*3/uL (ref 140–400)
RBC: 5.07 10*6/uL (ref 3.80–5.10)
RDW: 14 % (ref 11.0–15.0)
Total Lymphocyte: 33.6 %
WBC: 6.4 10*3/uL (ref 3.8–10.8)

## 2022-02-06 LAB — TSH: TSH: 1.66 mIU/L (ref 0.40–4.50)

## 2022-02-11 ENCOUNTER — Encounter: Payer: Self-pay | Admitting: Family Medicine

## 2022-02-11 ENCOUNTER — Ambulatory Visit (INDEPENDENT_AMBULATORY_CARE_PROVIDER_SITE_OTHER): Payer: BC Managed Care – PPO | Admitting: Family Medicine

## 2022-02-11 VITALS — BP 124/88 | HR 83 | Temp 97.3°F | Ht 61.0 in | Wt 156.0 lb

## 2022-02-11 DIAGNOSIS — Z0000 Encounter for general adult medical examination without abnormal findings: Secondary | ICD-10-CM

## 2022-02-11 DIAGNOSIS — L989 Disorder of the skin and subcutaneous tissue, unspecified: Secondary | ICD-10-CM

## 2022-02-11 DIAGNOSIS — F411 Generalized anxiety disorder: Secondary | ICD-10-CM

## 2022-02-11 DIAGNOSIS — Z85038 Personal history of other malignant neoplasm of large intestine: Secondary | ICD-10-CM

## 2022-02-11 DIAGNOSIS — Z7189 Other specified counseling: Secondary | ICD-10-CM

## 2022-02-11 MED ORDER — HYDROXYZINE HCL 10 MG PO TABS: ORAL_TABLET | ORAL | 1 refills | Status: DC

## 2022-02-11 MED ORDER — DULOXETINE HCL 60 MG PO CPEP: ORAL_CAPSULE | ORAL | 3 refills | Status: DC

## 2022-02-11 MED ORDER — DULOXETINE HCL 30 MG PO CPEP: 30.0000 mg | ORAL_CAPSULE | ORAL | 3 refills | Status: DC

## 2022-02-11 NOTE — Progress Notes (Unsigned)
CPE- See plan.  Routine anticipatory guidance given to patient.  See health maintenance.  The possibility exists that previously documented standard health maintenance information may have been brought forward from a previous encounter into this note.  If needed, that same information has been updated to reflect the current situation based on today's encounter.    Tetanus 2015  Flu shot d/w pt.   PNA d/w pt Shingles d/w pt.   covid vaccine d/w pt.   Mammogram pending 2023.   Defer pap to gyn.  She agrees.  per Dr. Dellis Filbert DXA not due. D/w pt.  Diet and exercise d/w pt.  Colonoscopy 2018.  D/w pt.  She is due.   Living will d/w pt. Husband designated if patient were incapacitated.  HCV prev neg.   H/o colon cancer, out of window to recheck CEA.  D/w pt about f/u and advised her to call GI.  She can do so.   Mood d/w pt.  Still on cymbalta.  No ADE on med.  Still with some anxiety.  Stressors d/w pt, caring for ailing parents.  She has some fears of crowds.    Labs d/w pt.  She wasn't fasting, so that would explain her lipids and sugar elevation.  Discussed.  PMH and SH reviewed  Meds, vitals, and allergies reviewed.   ROS: Per HPI.  Unless specifically indicated otherwise in HPI, the patient denies:  General: fever. Eyes: acute vision changes ENT: sore throat Cardiovascular: chest pain Respiratory: SOB GI: vomiting GU: dysuria Musculoskeletal: acute back pain Derm: acute rash Neuro: acute motor dysfunction Psych: worsening mood Endocrine: polydipsia Heme: bleeding Allergy: hayfever  GEN: nad, alert and oriented HEENT: mucous membranes moist, 55m fleshy papule w/o ulceration in the R pinna.   NECK: supple w/o LA CV: rrr. PULM: ctab, no inc wob ABD: soft, +bs EXT: no edema SKIN: no acute rash

## 2022-02-11 NOTE — Patient Instructions (Addendum)
Call GI about follow up.   You can use hydroxyzine if needed, if anxious.  Let me know if that doesn't help or isn't tolerable.  Sedation cations.   Take care.  Glad to see you.  Call the skin clinic about follow up and ask about your ear.

## 2022-02-12 DIAGNOSIS — L989 Disorder of the skin and subcutaneous tissue, unspecified: Secondary | ICD-10-CM | POA: Insufficient documentation

## 2022-02-12 NOTE — Assessment & Plan Note (Signed)
Tetanus 2015  Flu shot d/w pt.   PNA d/w pt Shingles d/w pt.   covid vaccine d/w pt.   Mammogram pending 2023.   Defer pap to gyn.  She agrees.  per Dr. Dellis Filbert DXA not due. D/w pt.  Diet and exercise d/w pt.  Colonoscopy 2018.  D/w pt.  She is due.   Living will d/w pt. Husband designated if patient were incapacitated.  HCV prev neg.

## 2022-02-12 NOTE — Assessment & Plan Note (Signed)
Living will d/w pt.  Husband designated if patient were incapacitated.  

## 2022-02-12 NOTE — Assessment & Plan Note (Signed)
Still on cymbalta.  No ADE on med.  Still with some anxiety.  Stressors d/w pt, caring for ailing parents.  She has some fears of crowds.  She can retry as needed hydroxyzine with routine cautions.

## 2022-02-12 NOTE — Assessment & Plan Note (Signed)
This looks like a benign warty lesion on the pinna but given the location I asked her to follow-up with dermatology instead of [place] addressing it here in clinic.  She agreed.

## 2022-02-12 NOTE — Assessment & Plan Note (Signed)
H/o colon cancer, out of window to recheck CEA.  D/w pt about f/u and advised her to call GI.  She can do so.  No abdominal pain black stools or bloody stools.

## 2022-02-17 LAB — HM MAMMOGRAPHY

## 2022-05-15 NOTE — Progress Notes (Signed)
Formatting of this note is different from the original.  Radiology Service Progress Note    DATE OF SERVICE:  May 15, 2022  TIME:  3:04 PM    PATIENT IDENTITY VERIFICATION COMPLETED USING TWO (2) STANDARD IDENTIFIERS:  Name and Date of Birth confirmed by patient verbally.  FALL SCREENING: Has the patient had 2 falls in the last year or 1 fall with injury or currently using an Ambulatory Assistive Device ([place], [place], Radio broadcast assistant, Health visitor, etc.)? No  PATIENT GENDER DATA:  Female.  Pregnancy status:  Pregnant: No  Breastfeeding status:  NO.  PATIENT RELEVANT IMPLANT DATA REVIEWED:  Not Applicable  ALLERGIES:  Reviewed and unchanged  CONTRAST ALLERGY:  NO.    EXAM: MRI - CONTRAST TYPE: GROUP II     PERIPHERAL IV DATA: Ambulatory:  A peripheral IV was started in the Right antecubital site with a Angio cath:  22 gauge.  RADIOLOGY DEPARTMENT: MR;  Exam(s) Completed: Chest: Breast    SIGNATURE: Thomasenia Sales Rt PATIENT NAME: Jane Romero   DATE: May 15, 2022 MRN: [id number]   TIME: 3:04 PM      Electronically signed by Reed Breech at 05/15/2022  3:05 PM EDT

## 2022-07-03 ENCOUNTER — Telehealth
Admit: 2022-07-03 | Discharge: 2022-07-03 | Payer: BLUE CROSS/BLUE SHIELD | Attending: Family Medicine | Primary: Family Medicine

## 2022-07-03 DIAGNOSIS — J329 Chronic sinusitis, unspecified: Secondary | ICD-10-CM

## 2022-07-03 MED ORDER — IBUPROFEN 800 MG PO TABS
800 MG | ORAL_TABLET | Freq: Two times a day (BID) | ORAL | 0 refills | Status: DC | PRN
Start: 2022-07-03 — End: 2023-05-19

## 2022-07-03 MED ORDER — AMOXICILLIN-POT CLAVULANATE 875-125 MG PO TABS
875-125 MG | ORAL_TABLET | Freq: Two times a day (BID) | ORAL | 0 refills | Status: AC
Start: 2022-07-03 — End: 2022-07-10

## 2022-07-03 MED ORDER — FAMCICLOVIR 500 MG PO TABS
500 MG | ORAL_TABLET | ORAL | 1 refills | Status: AC
Start: 2022-07-03 — End: 2023-01-02

## 2022-07-03 MED ORDER — BUPROPION HCL ER (XL) 300 MG PO TB24
300 MG | ORAL_TABLET | ORAL | 1 refills | Status: DC
Start: 2022-07-03 — End: 2023-01-02

## 2022-07-03 NOTE — Progress Notes (Signed)
TeleMedicine Video Visit    Jane Romero, was evaluated through a synchronous (real-time) audio-video encounter. The patient (or guardian if applicable) is aware that this is a billable service. Verbal consent to proceed has been obtained within the past 12 months. The visit was conducted pursuant to the emergency declaration under the [place] waiver authority and the [employer] Act.  Patient identification was verified, and a caregiver was present when appropriate. The patient was located in a state where the provider was credentialed to provide care.     Patient identification was verified at the start of the visit, including the patient's telephone number and physical location. I discussed with the patient the nature of our telehealth visits, that:     Due to the nature of an audio- video modality, the only components of a physical exam that could be done are the elements supported by direct observation.  I would evaluate the patient and recommend diagnostics and treatments based on my assessment.  If it was felt that the patient should be evaluated in clinic or an emergency room setting, then they would be directed there.  Our sessions are not being recorded and that personal health information is protected.  Our team would provide follow up care in person if/when the patient needs it.       Patient's location: home address in [place]. Physician  location other address in [place] other people involved in call none    Not billed by time    This visit was completed virtually using MyChart    Jane Romero was evaluated through a synchronous (real-time) audio-video encounter. The patient (or guardian if applicable) is aware that this is a billable service, which includes applicable co-pays. This Virtual Visit was conducted with patient's (and/or legal guardian's) consent. Patient identification was verified, and a  caregiver was present when appropriate.   The patient was located at Home: [place] [id number]  Provider was located at [employer] (Appt Dept): [place] [address]         Total time spent for this encounter: Not billed by time    [facility]        SUBJECTIVE:  CC: had concerns including Sinus Problem (She has a lot of congestion, green mucus, sore throat, headache since yesterday), Medication Refill, and Other (Patient is in the state of [place] and gives consent for the VV  ).  HPI:  Jane Romero is a female 61 y.o. presented for a vv for medication refills. Last seen 06/2021. States sinus infection started yesterday. Taking [facility] cold and sinus medication. 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     Review of Systems   Constitutional:  Negative for appetite change, fatigue and fever.   HENT:  Positive for congestion and sinus pressure.    Respiratory:  Negative for cough, shortness of breath and wheezing.    Cardiovascular:  Negative for chest pain and palpitations.   Gastrointestinal:  Negative for abdominal pain, constipation, diarrhea, nausea and vomiting.   Musculoskeletal:  Positive for arthralgias.   Psychiatric/Behavioral:  Negative for suicidal ideas.         Depression       Outpatient Medications Marked as Taking for the 07/03/22 encounter (Telemedicine) with Daisey Must, MD   Medication Sig Dispense Refill    buPROPion [facility]  XL) 300 MG extended release tablet Take one a day 90 tablet 1    famciclovir (FAMVIR) 500 MG tablet TAKE 1 TABLET BY MOUTH EVERY DAY 90 tablet 1    amoxicillin-clavulanate (AUGMENTIN) 875-125 MG per tablet Take 1 tablet by mouth 2 times daily for 7 days 14 tablet 0    ibuprofen (ADVIL;MOTRIN) 800 MG tablet Take 1 tablet by mouth 2 times daily as needed for Pain 60 tablet 0    fluticasone (FLONASE) 50 MCG/ACT nasal spray USE 1 SPRAY BY NASAL ROUTE DAILY. 1 each 2    diclofenac  sodium (VOLTAREN) 1 % GEL Apply 4 g topically 4 times daily 100 g 3       I have reviewed all pertinent PMHx, PSHx, FamHx, SocialHx, medications, and allergies and updated history as appropriate.    OBJECTIVE    VS: There were no vitals taken for this visit.  Physical Exam  Constitutional:       General: She is not in acute distress.     Appearance: Normal appearance. She is not ill-appearing.   Neurological:      Mental Status: She is oriented to person, place, and time.   Psychiatric:         Mood and Affect: Mood normal.         ASSESSMENT/PLAN:  1. Viral sinusitis  Conservative/supportive treatment as discussed. If persisting or worsening symptoms after 7 days without improvement ok to start Augmentin. Recommend nasal rinses.   - amoxicillin-clavulanate (AUGMENTIN) 875-125 MG per tablet; Take 1 tablet by mouth 2 times daily for 7 days  Dispense: 14 tablet; Refill: 0    2. Depression, unspecified depression type  Stable. Continue current regimen. No s/h ideations.   - buPROPion (WELLBUTRIN XL) 300 MG extended release tablet; Take one a day  Dispense: 90 tablet; Refill: 1    3. Herpes genitalis in women  Stable. No recent infections. Continue suppressive therapy.  - famciclovir (FAMVIR) 500 MG tablet; TAKE 1 TABLET BY MOUTH EVERY DAY  Dispense: 90 tablet; Refill: 1    4. Primary osteoarthritis involving multiple joints  Using Ibuprofen approximately once a week during busy season. Risk/benefits and alternatives discussed.  - ibuprofen (ADVIL;MOTRIN) 800 MG tablet; Take 1 tablet by mouth 2 times daily as needed for Pain  Dispense: 60 tablet; Refill: 0    5. Prediabetes  Updated baseline labs placed.   - CBC; Future  - Comprehensive Metabolic Panel; Future  - Hemoglobin A1C; Future  - Vitamin D 25 Hydroxy; Future      I have reviewed my findings and recommendations with Salem Caster, MD  07/10/2022 10:30 PM  Return for established f/u with pcp, Dr. Gerlene Burdock.     Counseled regarding above  diagnosis, including possible risks and complications, especially if left uncontrolled.    Patient counseled on red flag symptoms and if they occur to go to the ED.     Discussed medications risk/benefits and possible side effects and alternatives to treatment. Patient and/or guardian verbalizes understanding, agrees, feels comfortable with and wishes to proceed with above treatment plan.      Advised patient regarding importance of keeping up with recommended health maintenance and to schedule as soon as possible if overdue, as this is important in assessing for undiagnosed pathology, especially cancer, as well as protecting against potentially harmful/life threatening disease.       Patient and/or guardian verbalizes understanding and agrees with above counseling, assessment and plan.  All questions answered.    Please note this report has been partially produced using speech recognition software  and may contain errors related to that system including grammar, punctuation and spelling as well as words and phrases that may seem inappropriate. If there are questions or concerns please feel free to contact me to clarify.

## 2022-07-29 IMAGING — CR DG CHEST 1V PORT
1 series · 1 of 1 positions shown · non-contrast
Comparison: None.

CLINICAL DATA: Cough and shortness of breath. 6NDDA-3L virus
infection.

EXAM:
PORTABLE CHEST 1 VIEW

[AP]
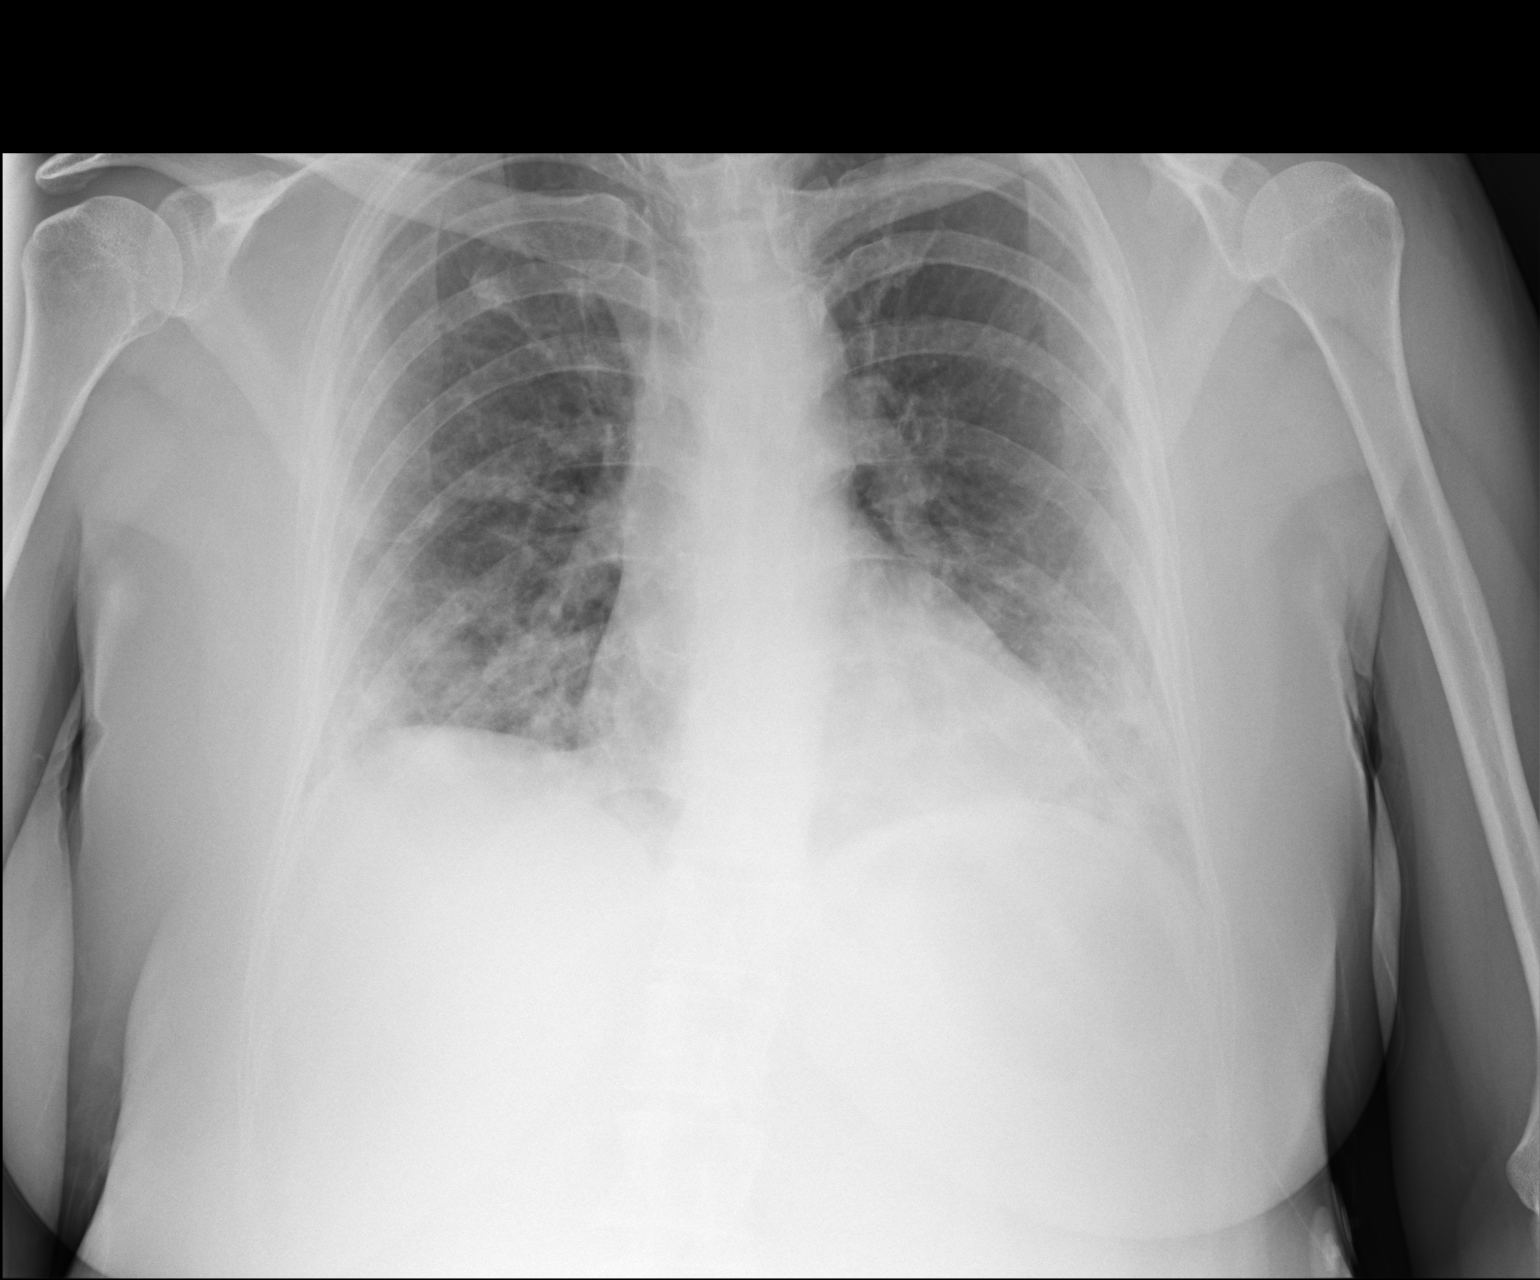

[1 of 1 positions shown; findings below may reference images not displayed]

FINDINGS: The heart size and mediastinal contours are within normal limits.
Mild airspace disease seen in both lung bases. No evidence of
pleural effusion.
IMPRESSION: Mild bibasilar airspace disease, suspicious for viral pneumonia.

## 2022-11-03 LAB — HM MAMMOGRAPHY

## 2022-12-31 ENCOUNTER — Other Ambulatory Visit: Payer: Self-pay | Admitting: Family Medicine

## 2022-12-31 NOTE — Telephone Encounter (Signed)
Patient due for CPE next month; please call to schedule.  

## 2022-12-31 NOTE — Telephone Encounter (Signed)
Spoke to pt, scheduled cpe for 02/27/23

## 2023-01-02 ENCOUNTER — Encounter

## 2023-01-02 MED ORDER — BUPROPION HCL ER (XL) 300 MG PO TB24
300 | ORAL_TABLET | ORAL | 0 refills | Status: DC
Start: 2023-01-02 — End: 2023-01-28

## 2023-01-02 MED ORDER — FAMCICLOVIR 500 MG PO TABS
500 | ORAL_TABLET | ORAL | 0 refills | Status: DC
Start: 2023-01-02 — End: 2023-01-28

## 2023-01-02 NOTE — Telephone Encounter (Signed)
Last Appointment:  07/03/2022  No future appointments.   Appt task assigned

## 2023-01-28 ENCOUNTER — Encounter

## 2023-01-28 MED ORDER — BUPROPION HCL ER (XL) 300 MG PO TB24
300 | ORAL_TABLET | ORAL | 0 refills | Status: AC
Start: 2023-01-28 — End: ?

## 2023-01-28 MED ORDER — FAMCICLOVIR 500 MG PO TABS
500 MG | ORAL_TABLET | ORAL | 0 refills | Status: DC
Start: 2023-01-28 — End: 2023-05-19

## 2023-01-28 NOTE — Telephone Encounter (Signed)
Last seen 07/03/2022  Next appt Visit date not found    Requested Prescriptions     Pending Prescriptions Disp Refills    buPROPion (WELLBUTRIN XL) 300 MG extended release tablet [Pharmacy Med Name: BUPROPION HCL XL 300 MG TABLET] 90 tablet 1     Sig: TAKE 1 TABLET BY MOUTH EVERY DAY        Electronically signed by Ileene Musa, MA on 01/28/23 at 10:39 AM EDT

## 2023-01-28 NOTE — Telephone Encounter (Signed)
Last seen 07/03/2022  Next appt Visit date not found    Requested Prescriptions     Pending Prescriptions Disp Refills    famciclovir (FAMVIR) 500 MG tablet [Pharmacy Med Name: FAMCICLOVIR 500 MG TABLET] 90 tablet 1     Sig: TAKE 1 TABLET BY MOUTH EVERY DAY        Electronically signed by Ileene Musa, MA on 01/28/23 at 1:00 PM EDT

## 2023-02-18 ENCOUNTER — Other Ambulatory Visit: Payer: Self-pay | Admitting: Family Medicine

## 2023-02-18 DIAGNOSIS — E785 Hyperlipidemia, unspecified: Secondary | ICD-10-CM

## 2023-02-19 LAB — HM MAMMOGRAPHY

## 2023-02-20 ENCOUNTER — Encounter: Payer: Self-pay | Admitting: Family Medicine

## 2023-02-20 ENCOUNTER — Other Ambulatory Visit (INDEPENDENT_AMBULATORY_CARE_PROVIDER_SITE_OTHER): Payer: BC Managed Care – PPO

## 2023-02-20 DIAGNOSIS — E785 Hyperlipidemia, unspecified: Secondary | ICD-10-CM | POA: Diagnosis not present

## 2023-02-20 LAB — CBC WITH DIFFERENTIAL/PLATELET
Absolute Monocytes: 464 cells/uL (ref 200–950)
Basophils Absolute: 31 cells/uL (ref 0–200)
Basophils Relative: 0.5 %
Eosinophils Absolute: 183 cells/uL (ref 15–500)
Eosinophils Relative: 3 %
HCT: 42.3 % (ref 35.0–45.0)
Hemoglobin: 14.1 g/dL (ref 11.7–15.5)
Lymphs Abs: 2086 cells/uL (ref 850–3900)
MCH: 27.5 pg (ref 27.0–33.0)
MCHC: 33.3 g/dL (ref 32.0–36.0)
MCV: 82.5 fL (ref 80.0–100.0)
MPV: 9.6 fL (ref 7.5–12.5)
Monocytes Relative: 7.6 %
Neutro Abs: 3337 cells/uL (ref 1500–7800)
Neutrophils Relative %: 54.7 %
Platelets: 346 10*3/uL (ref 140–400)
RBC: 5.13 10*6/uL — ABNORMAL HIGH (ref 3.80–5.10)
RDW: 13.3 % (ref 11.0–15.0)
Total Lymphocyte: 34.2 %
WBC: 6.1 10*3/uL (ref 3.8–10.8)

## 2023-02-20 NOTE — Addendum Note (Signed)
Addended by: Vincenza Hews on: 02/20/2023 09:56 AM   Modules accepted: Orders

## 2023-02-21 ENCOUNTER — Encounter

## 2023-02-24 ENCOUNTER — Other Ambulatory Visit: Payer: Self-pay | Admitting: Family Medicine

## 2023-02-24 NOTE — Telephone Encounter (Signed)
Refill request for  DULOXETINE HCL DR 60 MG CAP   LOV - 02/11/22 Next OV - 02/27/23 Last refill - 02/11/22 #90/3

## 2023-02-25 ENCOUNTER — Encounter

## 2023-02-25 NOTE — Telephone Encounter (Signed)
Sent. Thanks.   

## 2023-02-27 ENCOUNTER — Ambulatory Visit (INDEPENDENT_AMBULATORY_CARE_PROVIDER_SITE_OTHER): Payer: BC Managed Care – PPO | Admitting: Family Medicine

## 2023-02-27 ENCOUNTER — Encounter: Payer: Self-pay | Admitting: Family Medicine

## 2023-02-27 VITALS — BP 122/84 | HR 92 | Temp 97.0°F | Ht 62.5 in | Wt 159.0 lb

## 2023-02-27 DIAGNOSIS — Z7189 Other specified counseling: Secondary | ICD-10-CM

## 2023-02-27 DIAGNOSIS — Z0000 Encounter for general adult medical examination without abnormal findings: Secondary | ICD-10-CM | POA: Diagnosis not present

## 2023-02-27 DIAGNOSIS — Z85038 Personal history of other malignant neoplasm of large intestine: Secondary | ICD-10-CM

## 2023-02-27 DIAGNOSIS — F411 Generalized anxiety disorder: Secondary | ICD-10-CM

## 2023-02-27 DIAGNOSIS — Z01419 Encounter for gynecological examination (general) (routine) without abnormal findings: Secondary | ICD-10-CM

## 2023-02-27 DIAGNOSIS — K760 Fatty (change of) liver, not elsewhere classified: Secondary | ICD-10-CM

## 2023-02-27 MED ORDER — DULOXETINE HCL 30 MG PO CPEP: 30.0000 mg | ORAL_CAPSULE | Freq: Every day | ORAL | 3 refills | Status: DC

## 2023-02-27 MED ORDER — HYDROXYZINE HCL 10 MG PO TABS: ORAL_TABLET | ORAL | 3 refills | Status: AC

## 2023-02-27 NOTE — Progress Notes (Unsigned)
CPE- See plan.  Routine anticipatory guidance given to patient.  See health maintenance.  The possibility exists that previously documented standard health maintenance information may have been brought forward from a previous encounter into this note.  If needed, that same information has been updated to reflect the current situation based on today's encounter.    Tetanus 2015  Flu shot d/w pt. encouraged. PNA d/w pt Shingles d/w pt.   Mammogram done 2024 Defer pap to gyn.  She agrees. She wanted referral to closer clinic DXA not due. D/w pt.  Diet and exercise d/w pt.  Colonoscopy 2018.  D/w pt.  She is due.  Referral placed 2024.   Living will d/w pt. Husband designated if patient were incapacitated.  HCV prev neg.   Prev coronary calcium score zero.    Mild LFT elevation stable, h/o fatty liver.  No GI sx, no vomiting, no blood in stool.    Anxiety.  She is out of work and caring for her parents.  Her father had a intracranial bleed.  Her husband is caring for his mother.  She had an addition added onto her house, but then her parents didn't move in with patient.  She isn't sleeping well.  She is less motivated to be out in public.  Hydroxyzine helps some with insomnia.  D/w pt about 5mg  dose in the day to see if that would help with insomnia.    PMH and SH reviewed  Meds, vitals, and allergies reviewed.   ROS: Per HPI.  Unless specifically indicated otherwise in HPI, the patient denies:  General: fever. Eyes: acute vision changes ENT: sore throat Cardiovascular: chest pain Respiratory: SOB GI: vomiting GU: dysuria Musculoskeletal: acute back pain Derm: acute rash Neuro: acute motor dysfunction Psych: worsening mood Endocrine: polydipsia Heme: bleeding Allergy: hayfever  GEN: nad, alert and oriented HEENT: mucous membranes moist NECK: supple w/o LA CV: rrr. PULM: ctab, no inc wob ABD: soft, +bs EXT: no edema SKIN: no acute rash

## 2023-02-27 NOTE — Patient Instructions (Addendum)
Try 1/2 tab of hydroxyzine in the daytime if needed, sedation caution.  See if that helps.   You should get a call about GYN and GI.  Take care.  Glad to see you.

## 2023-03-01 DIAGNOSIS — K760 Fatty (change of) liver, not elsewhere classified: Secondary | ICD-10-CM | POA: Insufficient documentation

## 2023-03-01 NOTE — Assessment & Plan Note (Signed)
Living will d/w pt.  Husband designated if patient were incapacitated.  

## 2023-03-01 NOTE — Assessment & Plan Note (Signed)
Continue Cymbalta. Hydroxyzine helps some with insomnia.  D/w pt about 5mg  dose in the day to see if that would help with insomnia.   Can continue with 10 mg at night if needed for sleep.

## 2023-03-01 NOTE — Assessment & Plan Note (Signed)
Mild LFT elevation stable, h/o fatty liver.  No GI sx, no vomiting, no blood in stool.  Discussed with patient about labs and diet.

## 2023-03-01 NOTE — Assessment & Plan Note (Signed)
Tetanus 2015  Flu shot d/w pt. encouraged. PNA d/w pt Shingles d/w pt.   Mammogram done 2024 Defer pap to gyn.  She agrees. She wanted referral to closer clinic DXA not due. D/w pt.  Diet and exercise d/w pt.  Colonoscopy 2018.  D/w pt.  She is due.  Referral placed 2024.   Living will d/w pt. Husband designated if patient were incapacitated.  HCV prev neg.

## 2023-04-14 NOTE — Telephone Encounter (Signed)
 Attempted to contact pt, no answer, left detailed vm.     Electronically signed by Ileene Musa, MA on 04/14/23 at 12:33 PM EDT

## 2023-04-14 NOTE — Telephone Encounter (Signed)
-----   Message from [place] sent at 04/13/2023  3:46 PM EDT -----  Regarding: ECC Appointment Request  ECC Appointment Request    Patient needs appointment for [facility] Appointment Type: Existing Condition Follow Up.    Patient Requested Dates(s): As soon as Possible.  Patient Requested Time: Afternoon Appointment.  Provider Name: Catterlin, Richard Lee, DO      Reason for Appointment Request: Established Patient - Available appointments did not meet patient need. The patient is requesting for an earlier appointment because she needs it for her prescription refill.  --------------------------------------------------------------------------------------------------------------------------    Relationship to Patient: Self     Call Back Information: OK to leave message on voicemail  Preferred Call Back Number: Phone: [phone]

## 2023-04-15 ENCOUNTER — Encounter

## 2023-04-15 MED ORDER — BUPROPION HCL ER (XL) 300 MG PO TB24
300 | ORAL_TABLET | ORAL | 0 refills | Status: DC
Start: 2023-04-15 — End: 2023-05-12

## 2023-04-15 NOTE — Telephone Encounter (Signed)
RX REQUEST

## 2023-05-01 ENCOUNTER — Other Ambulatory Visit (HOSPITAL_COMMUNITY)
Admission: RE | Admit: 2023-05-01 | Discharge: 2023-05-01 | Disposition: A | Discharge status: Home / Self Care | Payer: BC Managed Care – PPO | Source: Ambulatory Visit | Attending: Family Medicine | Admitting: Family Medicine

## 2023-05-01 ENCOUNTER — Encounter: Payer: Self-pay | Admitting: Family Medicine

## 2023-05-01 ENCOUNTER — Ambulatory Visit: Payer: BC Managed Care – PPO | Admitting: Family Medicine

## 2023-05-01 VITALS — BP 155/85 | HR 80 | Ht 61.0 in | Wt 160.0 lb

## 2023-05-01 DIAGNOSIS — Z01419 Encounter for gynecological examination (general) (routine) without abnormal findings: Secondary | ICD-10-CM | POA: Insufficient documentation

## 2023-05-01 DIAGNOSIS — L900 Lichen sclerosus et atrophicus: Secondary | ICD-10-CM | POA: Diagnosis not present

## 2023-05-01 DIAGNOSIS — Z1339 Encounter for screening examination for other mental health and behavioral disorders: Secondary | ICD-10-CM | POA: Diagnosis not present

## 2023-05-01 MED ORDER — CLOBETASOL PROPIONATE 0.05 % EX OINT
TOPICAL_OINTMENT | CUTANEOUS | 5 refills | Status: AC
Start: 2023-05-01 — End: ?

## 2023-05-01 NOTE — Progress Notes (Signed)
New GYN here for problem visit today.   Last pap: 3 yrs ago per pt done with Dr. Seymour Bars in [place] GYN Mammogram:02/19/23 Pt has Family Hx of Breast Cancer: Cousin Dx in 30's.  Already had Labs drawn on 02/20/23  CC: Lichen Sclerosis no flare up today. Pt has used Clobetasol has much relief will need refill.   Fun Fact : pt loves being with her grandchildren and traveling

## 2023-05-01 NOTE — Progress Notes (Signed)
GYNECOLOGY ANNUAL PREVENTATIVE CARE ENCOUNTER NOTE  Subjective:   Jacqueline Garza is a 62 y.o. G1P2002 female here for a routine annual gynecologic exam.  Current complaints: None.   Here to establish care. Previously seen by Dr Seymour Bars.   Has a abnormal pap years ago that normalized on repeat testing Lichen Sclerosis: Has history of lichen sclerosis, dx about 5 yr ago with about 3-4 flares per year. Previously managed on clobetasol with good effect. Denies any current sx/itching    Denies abnormal vaginal bleeding, discharge, pelvic pain, problems with intercourse or other gynecologic concerns.    Gynecologic History Patient's last menstrual period was 08/19/2010. Contraception: post menopausal status Last Pap: 2021. Results were: normal Last mammogram: Yearly. Results were: normal  Health Maintenance Due  Topic Date Due   Zoster Vaccines- Shingrix (1 of 2) Never done   Cervical Cancer Screening (HPV/Pap Cotest)  08/05/2019   Colonoscopy  09/06/2019    The following portions of the patient's history were reviewed and updated as appropriate: allergies, current medications, past family history, past medical history, past social history, past surgical history and problem list.  Review of Systems Pertinent items are noted in HPI.   Objective:  BP (!) 155/85   Pulse 80   Ht 5\' 1"  (1.549 m)   Wt 160 lb (72.6 kg)   LMP 08/19/2010   BMI 30.23 kg/m  CONSTITUTIONAL: Well-developed, well-nourished female in no acute distress.  HENT:  Normocephalic, atraumatic, External right and left ear normal. Oropharynx is clear and moist EYES:  No scleral icterus.  NECK: Normal range of motion, supple, no masses.  Normal thyroid.  SKIN: Skin is warm and dry. No rash noted. Not diaphoretic. No erythema. No pallor. NEUROLOGIC: Alert and oriented to person, place, and time. Normal reflexes, muscle tone coordination. No cranial nerve deficit noted. PSYCHIATRIC: Normal mood and affect. Normal  behavior. Normal judgment and thought content. CARDIOVASCULAR: Normal heart rate noted, regular rhythm. 2+ distal pulses. RESPIRATORY: Effort and breath sounds normal, no problems with respiration noted. BREASTS: Symmetric in size. No masses, skin changes, nipple drainage, or lymphadenopathy. ABDOMEN: Soft,  no distention noted.  No tenderness, rebound or guarding.  PELVIC: Normal appearing external genitalia; Mildly atrophic vaginal mucosa and cervix. + lichen sclerotic changes perinanal and also at base of introitus. No abnormal discharge noted. There does appear to be a cystocele and makes the speculum exam more challenging.  Pap smear obtained.  Normal uterine size, no other palpable masses, no uterine or adnexal tenderness. Chaperone present for exam MUSCULOSKELETAL: Normal range of motion.    Assessment and Plan:  1) Annual gynecologic examination with pap smear:  Will follow up results of pap smear and manage accordingly. STI screening desired No.  Routine preventative health maintenance measures emphasized. Reviewed perimenopausal symptoms and management.    1. Well woman exam with routine gynecological exam - Cytology - PAP - Patient has been referred for [facility] screening/follow up and has been contacted by the GI but has not scheduled her colonoscopy yet.  - has had Lab work with PCP for yearly screening  2. Lichen sclerosus (52841) - Has 3-4 flares per year, never had skin bx, dx about 5 years ago - Reviewed if flares increase in frequency or sx change I would recommend biopsy - Reviewed also patient regimen for treating flares. - clobetasol ointment (TEMOVATE) 0.05 %; Apply to affected area every night for 4 weeks, then every other day for 4 weeks and then twice a week for  4 weeks or until resolution.  Dispense: 30 g; Refill: 5  3. Elevated BP- transient, has PCP and last BP in August was 122/84. Recommend PCP follow up  Please refer to After Visit Summary for other counseling  recommendations.   No follow-ups on file.  Federico Flake, MD, MPH, ABFM Attending Physician Center for [facility]

## 2023-05-05 LAB — CYTOLOGY - PAP
Comment: NEGATIVE
Diagnosis: NEGATIVE
High risk HPV: NEGATIVE

## 2023-05-10 ENCOUNTER — Encounter

## 2023-05-11 NOTE — Telephone Encounter (Signed)
Last Appointment:  07/03/2022  Future Appointments   Date Time Provider Department Center   05/19/2023  2:15 PM Rica Koyanagi, DO [facility] ECC DEP

## 2023-05-12 MED ORDER — BUPROPION HCL ER (XL) 300 MG PO TB24
300 MG | ORAL_TABLET | ORAL | 0 refills | Status: DC
Start: 2023-05-12 — End: 2023-05-19

## 2023-05-14 ENCOUNTER — Encounter: Payer: Self-pay | Admitting: Family Medicine

## 2023-05-19 ENCOUNTER — Encounter
Admit: 2023-05-19 | Discharge: 2023-05-19 | Payer: BLUE CROSS/BLUE SHIELD | Attending: Family Medicine | Primary: Family Medicine

## 2023-05-19 DIAGNOSIS — Z0000 Encounter for general adult medical examination without abnormal findings: Secondary | ICD-10-CM

## 2023-05-19 MED ORDER — IBUPROFEN 800 MG PO TABS
800 MG | ORAL_TABLET | Freq: Two times a day (BID) | ORAL | 0 refills | Status: DC | PRN
Start: 2023-05-19 — End: 2023-06-17

## 2023-05-19 MED ORDER — BUPROPION HCL ER (XL) 300 MG PO TB24
300 MG | ORAL_TABLET | ORAL | 1 refills | Status: DC
Start: 2023-05-19 — End: 2023-11-20

## 2023-05-19 MED ORDER — FAMCICLOVIR 500 MG PO TABS
500 | ORAL_TABLET | ORAL | 1 refills | Status: AC
Start: 2023-05-19 — End: ?

## 2023-05-19 NOTE — Progress Notes (Signed)
SUBJECTIVE  Jane Romero is a 62 y.o. female.    HPI/Chief C/O:  Chief Complaint   Patient presents with    Medication Refill     Pharmacy verified     Allergies   Allergen Reactions    Codeine Itching   The patient is here for a medication list and treatment planning review  We will go over our care planning goals as well as take care of all refills  We will set up labs as well        Hypertension  Associated symptoms include anxiety and malaise/fatigue. Pertinent negatives include no blurred vision, chest pain, headaches, neck pain, orthopnea, palpitations, peripheral edema, PND, shortness of breath or sweats. Risk factors for coronary artery disease include post-menopausal state and stress. Past treatments include lifestyle changes. The current treatment provides significant improvement. Compliance problems include psychosocial issues, exercise and diet.  There is no history of angina, kidney disease, CAD/MI, CVA, heart failure, left ventricular hypertrophy, PVD or retinopathy. There is no history of chronic renal disease, coarctation of the aorta, hyperaldosteronism, hypercortisolism, hyperparathyroidism, a hypertension causing med, pheochromocytoma, renovascular disease, sleep apnea or a thyroid problem.         ROS:  Review of Systems   Constitutional:  Positive for malaise/fatigue. Negative for activity change, appetite change, chills, diaphoresis, fatigue, fever and unexpected weight change.   HENT: Negative.  Negative for congestion, dental problem, drooling, ear discharge, ear pain, facial swelling, hearing loss, mouth sores, nosebleeds, postnasal drip, rhinorrhea, sinus pressure, sinus pain, sneezing, sore throat, tinnitus, trouble swallowing and voice change.    Eyes:  Negative for blurred vision, photophobia, pain, discharge, redness, itching and visual disturbance.   Respiratory: Negative.  Negative for apnea, cough, choking, chest tightness, shortness of breath, wheezing and stridor.     Cardiovascular: Negative.  Negative for chest pain, palpitations, orthopnea, leg swelling and PND.   Gastrointestinal: Negative.  Negative for abdominal distention, abdominal pain, anal bleeding, blood in stool, constipation, diarrhea, nausea, rectal pain and vomiting.   Endocrine: Negative.  Negative for cold intolerance, heat intolerance, polydipsia, polyphagia and polyuria.   Genitourinary: Negative.  Negative for decreased urine volume, difficulty urinating, dyspareunia, dysuria, enuresis, flank pain, frequency, genital sores, hematuria and urgency.   Musculoskeletal:  Negative for arthralgias, back pain, gait problem, joint swelling, myalgias, neck pain and neck stiffness.   Skin: Negative.  Negative for color change, pallor, rash and wound.   Allergic/Immunologic: Negative.  Negative for environmental allergies, food allergies and immunocompromised state.   Neurological: Negative.  Negative for dizziness, tremors, seizures, syncope, facial asymmetry, speech difficulty, weakness, light-headedness, numbness and headaches.   Hematological: Negative.  Negative for adenopathy. Does not bruise/bleed easily.   Psychiatric/Behavioral:  Positive for dysphoric mood. Negative for agitation, behavioral problems, confusion, decreased concentration, hallucinations, self-injury, sleep disturbance and suicidal ideas. The patient is nervous/anxious. The patient is not hyperactive.         Past Medical/Surgical Hx;  Reviewed with patient  History reviewed. No pertinent past medical history.  History reviewed. No pertinent surgical history.    Past Family Hx:  Reviewed with patient  History reviewed. No pertinent family history.    Social Hx:  Reviewed with patient  Social History     Tobacco Use    Smoking status: Former     Current packs/day: 0.00     Average packs/day: 0.3 packs/day for 45.7 years (11.4 ttl pk-yrs)     Types: Cigarettes     Start date: 08/23/1974  Quit date: 04/30/2020     Years since quitting: 3.0     Smokeless tobacco: Never   Substance Use Topics    Alcohol use: No       OBJECTIVE  BP 138/82   Pulse 79   Temp 98.1 F (36.7 C)   Resp 18   Ht 1.676 m (5\' 6" )   Wt 52.3 kg (115 lb 6.4 oz)   LMP  (LMP Unknown)   SpO2 98%   BMI 18.63 kg/m     Problem List:  Laketa does not have any pertinent problems on file.    PHYS EX:  Physical Exam  Vitals and nursing note reviewed.   Constitutional:       General: She is not in acute distress.     Appearance: Normal appearance. She is well-developed. She is not ill-appearing, toxic-appearing or diaphoretic.   HENT:      Head: Normocephalic and atraumatic.      Right Ear: External ear normal.      Left Ear: External ear normal.      Nose: Nose normal. No congestion or rhinorrhea.      Mouth/Throat:      Mouth: Mucous membranes are moist.      Pharynx: No oropharyngeal exudate or posterior oropharyngeal erythema.   Eyes:      General: Lids are everted, no foreign bodies appreciated. No scleral icterus.        Right eye: No foreign body, discharge or hordeolum.         Left eye: No foreign body, discharge or hordeolum.      Extraocular Movements: Extraocular movements intact.      Right eye: Normal extraocular motion and no nystagmus.      Left eye: Normal extraocular motion and no nystagmus.      Conjunctiva/sclera: Conjunctivae normal.      Right eye: Right conjunctiva is not injected. No chemosis, exudate or hemorrhage.     Left eye: Left conjunctiva is not injected. No chemosis, exudate or hemorrhage.     Pupils: Pupils are equal, round, and reactive to light. Pupils are equal.      Right eye: Pupil is round and reactive.      Left eye: Pupil is round and reactive.   Neck:      Thyroid: No thyromegaly.      Vascular: No carotid bruit or JVD.      Trachea: No tracheal deviation.   Cardiovascular:      Rate and Rhythm: Normal rate and regular rhythm. No extrasystoles are present.     Pulses: Normal pulses.      Heart sounds: Heart sounds not distant. Murmur heard.       Systolic murmur is present with a grade of 1/6.      No diastolic murmur is present.      No friction rub. No gallop.   Pulmonary:      Effort: Pulmonary effort is normal. No respiratory distress.      Breath sounds: Normal breath sounds. No stridor. No wheezing, rhonchi or rales.   Chest:      Chest wall: No tenderness.   Abdominal:      General: Bowel sounds are normal. There is no distension.      Palpations: Abdomen is soft. There is no mass.      Tenderness: There is no abdominal tenderness. There is no right CVA tenderness, left CVA tenderness, guarding or rebound.      Hernia: No hernia  is present.   Musculoskeletal:         General: No swelling, tenderness, deformity or signs of injury. Normal range of motion.      Cervical back: Normal range of motion and neck supple. No rigidity. No muscular tenderness.      Right lower leg: No edema.      Left lower leg: No edema.   Lymphadenopathy:      Cervical: No cervical adenopathy.   Skin:     General: Skin is warm.      Coloration: Skin is not jaundiced or pale.      Findings: No bruising, erythema, lesion or rash.   Neurological:      General: No focal deficit present.      Mental Status: She is alert and oriented to person, place, and time.      Cranial Nerves: No cranial nerve deficit.      Sensory: No sensory deficit.      Motor: No weakness or abnormal muscle tone.      Coordination: Coordination normal.      Gait: Gait normal.      Deep Tendon Reflexes: Reflexes are normal and symmetric. Reflexes normal.   Psychiatric:      Comments: Mood is stable         ASSESSMENT/PLAN  Thuong was seen today for medication refill.    Diagnoses and all orders for this visit:    Well adult exam  Long talk on treatment and prevention  Literature is given   --long talk on diet, vitamins, exercise, drugs, alcohol, sexual activity , smoking       Depression, unspecified depression type  -     buPROPion (WELLBUTRIN XL) 300 MG extended release tablet; TAKE 1 TABLET BY MOUTH EVERY  DAY  -     Comprehensive Metabolic Panel; Future  -     CBC with Auto Differential; Future    Herpes genitalis in women  -     famciclovir (FAMVIR) 500 MG tablet; TAKE 1 TABLET BY MOUTH EVERY DAY  -     Comprehensive Metabolic Panel; Future  -     CBC with Auto Differential; Future    Primary osteoarthritis involving multiple joints  -     ibuprofen (ADVIL;MOTRIN) 800 MG tablet; Take 1 tablet by mouth 2 times daily as needed for Pain  -     Comprehensive Metabolic Panel; Future  -     CBC with Auto Differential; Future    Prediabetes  -     Comprehensive Metabolic Panel; Future  -     CBC with Auto Differential; Future  -     Hemoglobin A1C; Future    Dyslipidemia  -     Comprehensive Metabolic Panel; Future  -     Lipid Panel; Future  -     CBC with Auto Differential; Future    Other fatigue  -     TSH; Future  -     Uric Acid; Future  -     Comprehensive Metabolic Panel; Future  -     CBC with Auto Differential; Future  -     Echo (TTE) complete (PRN contrast/bubble/strain/3D); Future    Murmur  -     Echo (TTE) complete (PRN contrast/bubble/strain/3D); Future  Long talk on treatment and prevention  Literature is given       Heart murmur  -     Echo (TTE) complete (PRN contrast/bubble/strain/3D); Future  Outpatient Encounter Medications as of 05/19/2023   Medication Sig Dispense Refill    buPROPion (WELLBUTRIN XL) 300 MG extended release tablet TAKE 1 TABLET BY MOUTH EVERY DAY 90 tablet 1    famciclovir (FAMVIR) 500 MG tablet TAKE 1 TABLET BY MOUTH EVERY DAY 90 tablet 1    ibuprofen (ADVIL;MOTRIN) 800 MG tablet Take 1 tablet by mouth 2 times daily as needed for Pain 90 tablet 0    [DISCONTINUED] buPROPion (WELLBUTRIN XL) 300 MG extended release tablet TAKE 1 TABLET BY MOUTH EVERY DAY 30 tablet 0    [DISCONTINUED] famciclovir (FAMVIR) 500 MG tablet TAKE 1 TABLET BY MOUTH EVERY DAY 30 tablet 0    [DISCONTINUED] ibuprofen (ADVIL;MOTRIN) 800 MG tablet Take 1 tablet by mouth 2 times daily as needed for Pain 60  tablet 0    [DISCONTINUED] fluticasone (FLONASE) 50 MCG/ACT nasal spray USE 1 SPRAY BY NASAL ROUTE DAILY. (Patient not taking: Reported on 05/19/2023) 1 each 2    [DISCONTINUED] diclofenac sodium (VOLTAREN) 1 % GEL Apply 4 g topically 4 times daily (Patient not taking: Reported on 05/19/2023) 100 g 3     No facility-administered encounter medications on file as of 05/19/2023.       No follow-ups on file.        Reviewed recent labs related to [facility] current problems      Discussed importance of regular Health Maintenance follow up  Health Maintenance   Topic    HIV screen     DTaP/Tdap/Td vaccine (1 - Tdap)    Respiratory Syncytial Virus (RSV) Pregnant or age 56 yrs+ (1 - 1-dose 60+ series)    A1C test (Diabetic or Prediabetic)     Colorectal Cancer Screen     Breast cancer screen     Depression Monitoring     Lipids     Cervical cancer screen     Flu vaccine     Shingles vaccine     COVID-19 Vaccine     Hepatitis C screen     Hepatitis A vaccine     Hepatitis B vaccine     Hib vaccine     Polio vaccine     Meningococcal (ACWY) vaccine     Pneumococcal 0-64 years Vaccine

## 2023-06-17 ENCOUNTER — Encounter: Admit: 2023-06-17 | Admitting: Family Medicine

## 2023-06-17 DIAGNOSIS — M150 Primary generalized (osteo)arthritis: Secondary | ICD-10-CM

## 2023-06-17 MED ORDER — IBUPROFEN 800 MG PO TABS
800 MG | ORAL_TABLET | Freq: Two times a day (BID) | ORAL | 1 refills | Status: DC
Start: 2023-06-17 — End: 2023-08-14

## 2023-06-17 NOTE — Telephone Encounter (Signed)
Last Appointment:  05/19/2023  Future Appointments   Date Time Provider Department Center   07/07/2023  7:00 AM [facility] ECHO 1 [facility] [place] [facility]   11/20/2023  1:15 PM Catterlin, Ula [place], DO [facility] E[facility] DEP

## 2023-07-07 ENCOUNTER — Inpatient Hospital Stay
Admit: 2023-07-07 | Discharge: 2023-07-07 | Disposition: A | Discharge status: Home / Self Care | Payer: BLUE CROSS/BLUE SHIELD | Source: Ambulatory Visit | Attending: Family Medicine | Admitting: Family Medicine | Primary: Family Medicine

## 2023-07-07 VITALS — BP 138/82 | Ht 66.0 in | Wt 115.0 lb

## 2023-07-07 DIAGNOSIS — R5383 Other fatigue: Secondary | ICD-10-CM

## 2023-07-07 LAB — ECHO (TTE) COMPLETE (PRN CONTRAST/BUBBLE/STRAIN/3D)
AR Max Velocity PISA: 4.9 m/s
AR PHT: 603.9 ms
AV Area by Peak Velocity: 2.6 cm2
AV Area by VTI: 2.5 cm2
AV Cusp Mmode: 1.9 cm
AV Mean Gradient: 4 mm[Hg]
AV Mean Velocity: 0.9 m/s
AV Peak Gradient: 9 mm[Hg]
AV Peak Velocity: 1.5 m/s
AV VTI: 29.1 cm
AV Velocity Ratio: 0.87
AVA/BSA Peak Velocity: 1.6 cm2/m2
AVA/BSA VTI: 1.6 cm2/m2
Ascending Aorta Index: 2.09 cm/m2
Ascending Aorta: 3.3 cm
Body Surface Area: 1.56 m2
EF Physician: 60 %
Est. RA Pressure: 3 mm[Hg]
Fractional Shortening 2D: 38 % (ref 28–44)
IVSd: 0.8 cm (ref 0.6–0.9)
IVSs: 1.3 cm
LA Diameter: 3.2 cm
LA Size Index: 2.03 cm/m2
LA Volume A-L A4C: 44 mL (ref 22–52)
LA Volume A-L A4C: 54 mL — AB (ref 22–52)
LA Volume A/L: 52 mL
LA Volume Index A-L A2C: 34 mL/m2 (ref 16–34)
LA Volume Index A-L A4C: 28 mL/m2 (ref 16–34)
LA Volume Index A/L: 33 mL/m2 (ref 16–34)
LA Volume Index MOD A2C: 32 mL/m2 (ref 16–34)
LA Volume Index MOD A4C: 24 mL/m2 (ref 16–34)
LA Volume MOD A2C: 51 mL (ref 22–52)
LA Volume MOD A4C: 38 mL (ref 22–52)
LV IVRT: 114.2 ms
LV Mass 2D Index: 95 g/m2 (ref 43–95)
LV Mass 2D: 150.1 g (ref 67–162)
LV RWT Ratio: 0.3
LVIDd Index: 3.35 cm/m2
LVIDd: 5.3 cm (ref 3.9–5.3)
LVIDs Index: 2.09 cm/m2
LVIDs: 3.3 cm
LVOT Area: 3.1 cm2
LVOT Diameter: 2 cm
LVOT Mean Gradient: 3 mm[Hg]
LVOT Peak Gradient: 6 mm[Hg]
LVOT Peak Velocity: 1.3 m/s
LVOT SV: 72.8 mL
LVOT Stroke Volume Index: 46.1 mL/m2
LVOT VTI: 23.2 cm
LVOT:AV VTI Index: 0.8
LVPWd: 0.8 cm (ref 0.6–0.9)
LVPWs: 1.3 cm
MV "A" Wave Duration: 90 ms
MV A Velocity: 0.71 m/s
MV Area by PHT: 3.5 cm2
MV Area by VTI: 2.3 cm2
MV E Velocity: 0.77 m/s
MV E Wave Deceleration Time: 176.7 ms
MV E/A: 1.08
MV Max Velocity: 0.9 m/s
MV Mean Gradient: 1 mm[Hg]
MV Mean Velocity: 0.5 m/s
MV PHT: 62.6 ms
MV Peak Gradient: 3 mm[Hg]
MV VTI: 31.4 cm
MV:LVOT VTI Index: 1.35
PV Max Velocity: 0.8 m/s
PV Mean Gradient: 1 mm[Hg]
PV Mean Velocity: 0.6 m/s
PV Peak Gradient: 3 mm[Hg]
PV VTI: 16 cm
Pulm Vein A Duration: 120 ms
Pulm Vein A Velocity: 0.2 m/s
Pulm Vein Peak D Velocity: 0.5 m/s
Pulm Vein Peak S Velocity: 0.6 m/s
Pulm Vein S/D: 1.2
RVSP: 26 mm[Hg]
TR Max Velocity: 2.4 m/s
TR Peak Gradient: 23 mm[Hg]

## 2023-07-27 NOTE — Telephone Encounter (Signed)
 Pt called and would like results for her echo. Please advise.     Electronically signed by Ileene Musa, MA on 07/27/23 at 3:14 PM EST

## 2023-07-27 NOTE — Telephone Encounter (Signed)
 Test is good  She has some mild valvular regurgitation which is of no major issue

## 2023-07-28 NOTE — Telephone Encounter (Signed)
 Mychart message sent.     Electronically signed by Ileene Musa, MA on 07/28/23 at 10:23 AM EST

## 2023-08-14 ENCOUNTER — Encounter

## 2023-08-14 MED ORDER — IBUPROFEN 800 MG PO TABS
800 | ORAL_TABLET | Freq: Two times a day (BID) | ORAL | 1 refills | 20.00000 days | Status: DC
Start: 2023-08-14 — End: 2023-11-20

## 2023-08-14 NOTE — Telephone Encounter (Signed)
Last seen 05/19/2023  Next appt 11/20/2023    Requested Prescriptions     Pending Prescriptions Disp Refills    ibuprofen (ADVIL;MOTRIN) 800 MG tablet [Pharmacy Med Name: IBUPROFEN 800 MG TABLET] 60 tablet 1     Sig: TAKE 1 TABLET BY MOUTH TWICE A DAY AS NEEDED FOR PAIN    Electronically signed by Ileene Musa, MA on 08/14/23 at 8:13 AM EST

## 2023-11-12 ENCOUNTER — Encounter

## 2023-11-12 NOTE — Telephone Encounter (Signed)
 Last Appointment:  05/19/2023  Future Appointments   Date Time Provider Department Center   11/20/2023  1:15 PM Jaquita Merl, DO [facility] E[facility] DEP

## 2023-11-13 MED ORDER — FAMCICLOVIR 500 MG PO TABS
500 | ORAL_TABLET | Freq: Every day | ORAL | 1 refills | Status: DC
Start: 2023-11-13 — End: 2023-11-20

## 2023-11-16 LAB — HUMAN PAPILLOMAVIRUS (HPV) DNA PROBE THIN PREP HIGH RISK
HPV, Genotype 16: NOT DETECTED
HPV, Genotype 18: NOT DETECTED
HPV, High Risk Other: NOT DETECTED

## 2023-11-17 LAB — GYN CYTOLOGY

## 2023-11-20 ENCOUNTER — Ambulatory Visit
Admit: 2023-11-20 | Discharge: 2023-11-20 | Payer: BLUE CROSS/BLUE SHIELD | Attending: Family Medicine | Primary: Family Medicine

## 2023-11-20 VITALS — BP 138/82 | HR 89 | Temp 97.40000°F | Resp 18 | Ht 66.0 in | Wt 116.0 lb

## 2023-11-20 DIAGNOSIS — I10 Essential (primary) hypertension: Secondary | ICD-10-CM

## 2023-11-20 MED ORDER — BUPROPION HCL ER (XL) 300 MG PO TB24
300 | ORAL_TABLET | ORAL | 1 refills | 90.00000 days | Status: DC
Start: 2023-11-20 — End: 2024-05-25

## 2023-11-20 MED ORDER — IBUPROFEN 800 MG PO TABS
800 | ORAL_TABLET | Freq: Two times a day (BID) | ORAL | 1 refills | 15.00000 days | Status: DC
Start: 2023-11-20 — End: 2024-02-17

## 2023-11-20 MED ORDER — FAMCICLOVIR 500 MG PO TABS
500 | ORAL_TABLET | Freq: Every day | ORAL | 1 refills | 15.00000 days | Status: DC
Start: 2023-11-20 — End: 2024-05-25

## 2023-11-20 NOTE — Progress Notes (Signed)
 SUBJECTIVE  Jane Romero is a 63 y.o. female.    HPI/Chief C/O:  Chief Complaint   Patient presents with    6 Month Follow-Up     Pt here for a 6 month follow up     Health Maintenance     Due for a mammogram (having done in 2 weeks) and CRC     Allergies   Allergen Reactions    Codeine Itching   The patient is here for a medication list and treatment planning review  We will go over our care planning goals as well as take care of all refills  We will set up labs as well        Hypertension  This is a chronic problem. The current episode started more than 1 year ago. Associated symptoms include anxiety and malaise/fatigue. Pertinent negatives include no blurred vision, chest pain, headaches, neck pain, orthopnea, palpitations, peripheral edema, PND, shortness of breath or sweats. Risk factors for coronary artery disease include post-menopausal state and stress. Past treatments include lifestyle changes. The current treatment provides moderate improvement. Compliance problems include psychosocial issues, exercise and diet.  There is no history of angina, kidney disease, CAD/MI, CVA, heart failure, left ventricular hypertrophy, PVD or retinopathy. There is no history of chronic renal disease, coarctation of the aorta, hyperaldosteronism, hypercortisolism, hyperparathyroidism, a hypertension causing med, pheochromocytoma, renovascular disease, sleep apnea or a thyroid problem.         ROS:  Review of Systems   Constitutional:  Positive for malaise/fatigue. Negative for activity change, appetite change, chills, diaphoresis, fatigue, fever and unexpected weight change.   HENT: Negative.  Negative for congestion, dental problem, drooling, ear discharge, ear pain, facial swelling, hearing loss, mouth sores, nosebleeds, postnasal drip, rhinorrhea, sinus pressure, sinus pain, sneezing, sore throat, tinnitus, trouble swallowing and voice change.    Eyes:  Negative for blurred vision, photophobia, pain, discharge,  redness, itching and visual disturbance.   Respiratory: Negative.  Negative for apnea, cough, choking, chest tightness, shortness of breath, wheezing and stridor.    Cardiovascular: Negative.  Negative for chest pain, palpitations, orthopnea, leg swelling and PND.   Gastrointestinal: Negative.  Negative for abdominal distention, abdominal pain, anal bleeding, blood in stool, constipation, diarrhea, nausea, rectal pain and vomiting.   Endocrine: Negative.  Negative for cold intolerance, heat intolerance, polydipsia, polyphagia and polyuria.   Genitourinary: Negative.  Negative for decreased urine volume, difficulty urinating, dyspareunia, dysuria, enuresis, flank pain, frequency, genital sores, hematuria and urgency.   Musculoskeletal:  Negative for arthralgias, back pain, gait problem, joint swelling, myalgias, neck pain and neck stiffness.   Skin: Negative.  Negative for color change, pallor, rash and wound.   Allergic/Immunologic: Negative.  Negative for environmental allergies, food allergies and immunocompromised state.   Neurological: Negative.  Negative for dizziness, tremors, seizures, syncope, facial asymmetry, speech difficulty, weakness, light-headedness, numbness and headaches.   Hematological: Negative.  Negative for adenopathy. Does not bruise/bleed easily.   Psychiatric/Behavioral:  Positive for dysphoric mood. Negative for agitation, behavioral problems, confusion, decreased concentration, hallucinations, self-injury, sleep disturbance and suicidal ideas. The patient is nervous/anxious. The patient is not hyperactive.         Past Medical/Surgical Hx;  Reviewed with patient      Diagnosis Date    Depression      History reviewed. No pertinent surgical history.    Past Family Hx:  Reviewed with patient  History reviewed. No pertinent family history.    Social Hx:  Reviewed with patient  Social History     Tobacco Use    Smoking status: Former     Current packs/day: 0.00     Average packs/day: 0.3  packs/day for 45.7 years (11.4 ttl pk-yrs)     Types: Cigarettes     Start date: 08/23/1974     Quit date: 04/30/2020     Years since quitting: 3.5    Smokeless tobacco: Never    Tobacco comments:     Quit smoking a year ago.   Substance Use Topics    Alcohol use: Yes       OBJECTIVE  BP 138/82   Pulse 89   Temp 97.4 F (36.3 C)   Resp 18   Ht 1.676 m (5\' 6" )   Wt 52.6 kg (116 lb)   LMP  (LMP Unknown)   SpO2 98%   BMI 18.72 kg/m     Problem List:  [name] does not have any pertinent problems on file.    PHYS EX:  Physical Exam  Vitals and nursing note reviewed.   Constitutional:       General: She is not in acute distress.     Appearance: Normal appearance. She is well-developed. She is not ill-appearing, toxic-appearing or diaphoretic.   HENT:      Head: Normocephalic and atraumatic.      Right Ear: External ear normal.      Left Ear: External ear normal.      Nose: Nose normal. No congestion or rhinorrhea.      Mouth/Throat:      Mouth: Mucous membranes are moist.      Pharynx: No oropharyngeal exudate or posterior oropharyngeal erythema.   Eyes:      General: Lids are everted, no foreign bodies appreciated. No scleral icterus.        Right eye: No foreign body, discharge or hordeolum.         Left eye: No foreign body, discharge or hordeolum.      Extraocular Movements: Extraocular movements intact.      Right eye: Normal extraocular motion and no nystagmus.      Left eye: Normal extraocular motion and no nystagmus.      Conjunctiva/sclera: Conjunctivae normal.      Right eye: Right conjunctiva is not injected. No chemosis, exudate or hemorrhage.     Left eye: Left conjunctiva is not injected. No chemosis, exudate or hemorrhage.     Pupils: Pupils are equal, round, and reactive to light. Pupils are equal.      Right eye: Pupil is round and reactive.      Left eye: Pupil is round and reactive.   Neck:      Thyroid: No thyromegaly.      Vascular: No carotid bruit or JVD.      Trachea: No tracheal deviation.    Cardiovascular:      Rate and Rhythm: Normal rate and regular rhythm. No extrasystoles are present.     Pulses: Normal pulses.      Heart sounds: Heart sounds not distant. Murmur heard.      Systolic murmur is present with a grade of 1/6.      No diastolic murmur is present.      No friction rub. No gallop.   Pulmonary:      Effort: Pulmonary effort is normal. No respiratory distress.      Breath sounds: Normal breath sounds. No stridor. No wheezing, rhonchi or rales.   Chest:      Chest  wall: No tenderness.   Abdominal:      General: Bowel sounds are normal. There is no distension.      Palpations: Abdomen is soft. There is no mass.      Tenderness: There is no abdominal tenderness. There is no right CVA tenderness, left CVA tenderness, guarding or rebound.      Hernia: No hernia is present.   Musculoskeletal:         General: No swelling, tenderness, deformity or signs of injury. Normal range of motion.      Cervical back: Normal range of motion and neck supple. No rigidity. No muscular tenderness.      Right lower leg: No edema.      Left lower leg: No edema.   Lymphadenopathy:      Cervical: No cervical adenopathy.   Skin:     General: Skin is warm.      Coloration: Skin is not jaundiced or pale.      Findings: No bruising, erythema, lesion or rash.   Neurological:      General: No focal deficit present.      Mental Status: She is alert and oriented to person, place, and time.      Cranial Nerves: No cranial nerve deficit.      Sensory: No sensory deficit.      Motor: No weakness or abnormal muscle tone.      Coordination: Coordination normal.      Gait: Gait normal.      Deep Tendon Reflexes: Reflexes are normal and symmetric. Reflexes normal.   Psychiatric:      Comments: Mood is stable         ASSESSMENT/PLAN  [facility] follow-up and health maintenance.    Diagnoses and all orders for this visit:    Primary hypertension  --patient is instructed on low to moderate sodium (  2 to 2.5 grams ), daily    Also to increase potassium in the diet to about 3.5 grams daily    Literature is provided       Depression, unspecified depression type  -     buPROPion  (WELLBUTRIN  XL) 300 MG extended release tablet; TAKE 1 TABLET BY MOUTH EVERY DAY  -     Comprehensive Metabolic Panel; Future  -     CBC with Auto Differential; Future  --stable on current care planning  -- continue treatment as we are meeting goals   Counseling is given for anxiety and depression   All questions were taken and answers are given        Herpes genitalis in women  -     famciclovir  (FAMVIR ) 500 MG tablet; Take 1 tablet by mouth daily  -     Comprehensive Metabolic Panel; Future  -     CBC with Auto Differential; Future    Primary osteoarthritis involving multiple joints  -     ibuprofen  (ADVIL ;MOTRIN ) 800 MG tablet; Take 1 tablet by mouth 2 times daily  -     Comprehensive Metabolic Panel; Future  -     CBC with Auto Differential; Future    Screen for colon cancer  -     Cologuard (Fecal DNA Colorectal Cancer Screening)  -     Comprehensive Metabolic Panel; Future  -     CBC with Auto Differential; Future    Prediabetes  -     Comprehensive Metabolic Panel; Future  -  CBC with Auto Differential; Future  -     Hemoglobin A1C; Future    Dyslipidemia  -     Comprehensive Metabolic Panel; Future  -     Lipid Panel; Future  -     CBC with Auto Differential; Future  --Mediterranean diet, exercise, weight loss, vitamins    We have a long talk on cholesterol and importance of lowering it       Other fatigue  -     TSH; Future  -     Uric Acid; Future  -     Comprehensive Metabolic Panel; Future  -     CBC with Auto Differential; Future        Outpatient Encounter Medications as of 11/20/2023   Medication Sig Dispense Refill    triamcinolone (KENALOG) 0.1 % cream PLEASE SEE ATTACHED FOR DETAILED DIRECTIONS      buPROPion  (WELLBUTRIN  XL) 300 MG extended release tablet TAKE 1 TABLET BY MOUTH EVERY DAY 90 tablet 1    famciclovir   (FAMVIR ) 500 MG tablet Take 1 tablet by mouth daily 90 tablet 1    ibuprofen  (ADVIL ;MOTRIN ) 800 MG tablet Take 1 tablet by mouth 2 times daily 60 tablet 1    [DISCONTINUED] famciclovir  (FAMVIR ) 500 MG tablet TAKE 1 TABLET BY MOUTH EVERY DAY 90 tablet 1    [DISCONTINUED] ibuprofen  (ADVIL ;MOTRIN ) 800 MG tablet TAKE 1 TABLET BY MOUTH TWICE A DAY AS NEEDED FOR PAIN 60 tablet 1    [DISCONTINUED] buPROPion  (WELLBUTRIN  XL) 300 MG extended release tablet TAKE 1 TABLET BY MOUTH EVERY DAY 90 tablet 1     No facility-administered encounter medications on file as of 11/20/2023.       Return in about 6 months (around 05/22/2024).        Reviewed recent labs related to [facility] current problems      Discussed importance of regular Health Maintenance follow up  Health Maintenance   Topic    HIV screen     Pneumococcal 50+ years Vaccine (1 of 1 - PCV)    DTaP/Tdap/Td vaccine (1 - Tdap)    A1C test (Diabetic or Prediabetic)     COVID-19 Vaccine (6 - 2024-25 season)    Colorectal Cancer Screen     Breast cancer screen     Depression Monitoring     Lipids     Cervical cancer screen     Respiratory Syncytial Virus (RSV) Pregnant or age 48 yrs+ (1 - 1-dose 75+ series)    Flu vaccine     Shingles vaccine     Hepatitis C screen     Hepatitis A vaccine     Hepatitis B vaccine     Hib vaccine     Polio vaccine     Meningococcal (ACWY) vaccine     Meningococcal B vaccine

## 2023-11-24 LAB — HM MAMMOGRAPHY

## 2024-01-16 LAB — FECAL DNA COLORECTAL CANCER SCREENING (COLOGUARD)

## 2024-01-17 ENCOUNTER — Other Ambulatory Visit: Payer: Self-pay | Admitting: Family Medicine

## 2024-01-18 ENCOUNTER — Encounter

## 2024-01-18 LAB — COMPREHENSIVE METABOLIC PANEL
ALT: 21 U/L (ref 0–35)
AST: 25 U/L (ref 0–35)
Albumin: 4.1 g/dL (ref 3.5–5.2)
Alkaline Phosphatase: 59 U/L (ref 35–104)
Anion Gap: 10 mmol/L (ref 7–16)
BUN: 16 mg/dL (ref 8–23)
CO2: 25 mmol/L (ref 22–29)
Calcium: 9.2 mg/dL (ref 8.8–10.2)
Chloride: 109 mmol/L — ABNORMAL HIGH (ref 98–107)
Creatinine: 0.8 mg/dL (ref 0.5–1.0)
Est, Glom Filt Rate: 78 mL/min/{1.73_m2} (ref >60)
Glucose: 84 mg/dL (ref 74–99)
Potassium: 4.8 mmol/L (ref 3.5–5.1)
Sodium: 144 mmol/L (ref 136–145)
Total Bilirubin: 0.5 mg/dL (ref 0.0–1.2)
Total Protein: 6.5 g/dL (ref 6.4–8.3)

## 2024-01-18 LAB — CBC WITH AUTO DIFFERENTIAL
Basophils %: 0 % (ref 0.0–2.0)
Basophils Absolute: 0 10*3/uL (ref 0.00–0.20)
Eosinophils %: 3 % (ref 0–6)
Eosinophils Absolute: 0.09 10*3/uL (ref 0.05–0.50)
Hematocrit: 42.4 % (ref 34.0–48.0)
Hemoglobin: 13.8 g/dL (ref 11.5–15.5)
Lymphocytes %: 10 % — ABNORMAL LOW (ref 20.0–42.0)
Lymphocytes Absolute: 0.35 10*3/uL — ABNORMAL LOW (ref 1.50–4.00)
MCH: 32.9 pg (ref 26.0–35.0)
MCHC: 32.5 g/dL (ref 32.0–34.5)
MCV: 101.2 fL — ABNORMAL HIGH (ref 80.0–99.9)
MPV: 9.8 fL (ref 7.0–12.0)
Metamyelocytes Absolute: 0.03 10*3/uL (ref 0.00–0.12)
Metamyelocytes: 1 % (ref 0–1)
Monocytes %: 8 % (ref 2.0–12.0)
Monocytes Absolute: 0.26 10*3/uL (ref 0.10–0.95)
Neutrophils %: 78 % (ref 43.0–80.0)
Neutrophils Absolute: 2.64 10*3/uL (ref 1.80–7.30)
Platelets: 218 10*3/uL (ref 130–450)
Promyelocytes Absolute: 0.03 10*3/uL — ABNORMAL HIGH
Promyelocytes: 1 % — ABNORMAL HIGH
RBC Morphology: NORMAL
RBC: 4.19 m/uL (ref 3.50–5.50)
RDW: 13.2 % (ref 11.5–15.0)
WBC: 3.4 10*3/uL — ABNORMAL LOW (ref 4.5–11.5)
nRBC: 2 /100{WBCs}

## 2024-01-18 LAB — LIPID PANEL
Cholesterol, Total: 209 mg/dL — ABNORMAL HIGH (ref <200)
HDL: 108 mg/dL (ref >40)
LDL Cholesterol: 89 mg/dL (ref <100)
Triglycerides: 59 mg/dL (ref <150)
VLDL: 12 mg/dL

## 2024-01-18 LAB — URIC ACID: Uric Acid: 4.2 mg/dL (ref 2.4–5.7)

## 2024-01-18 LAB — TSH: TSH: 1.06 u[IU]/mL (ref 0.27–4.20)

## 2024-01-18 LAB — HEMOGLOBIN A1C: Hemoglobin A1C: 5.4 % (ref 4.0–5.6)

## 2024-01-18 NOTE — Telephone Encounter (Signed)
 LOV:02/27/23 WNC:WNUYPWH SCHEDULED  LAST REFILL: DULoxetine  (CYMBALTA ) 30 MG capsule 90 capsule 3 refills

## 2024-02-16 ENCOUNTER — Other Ambulatory Visit: Payer: Self-pay | Admitting: Family Medicine

## 2024-02-17 ENCOUNTER — Encounter

## 2024-02-17 MED ORDER — IBUPROFEN 800 MG PO TABS
800 | ORAL_TABLET | Freq: Two times a day (BID) | ORAL | 1 refills | 15.00000 days | Status: DC
Start: 2024-02-17 — End: 2024-04-22

## 2024-02-17 NOTE — Telephone Encounter (Signed)
 Last seen 11/20/2023  Next appt 05/25/2024    Requested Prescriptions     Pending Prescriptions Disp Refills    ibuprofen  (ADVIL ;MOTRIN ) 800 MG tablet [Pharmacy Med Name: IBUPROFEN  800 MG TABLET] 60 tablet 1     Sig: TAKE 1 TABLET BY MOUTH TWICE A DAY    Electronically signed by CHARMAINE GEORGES, MA on 02/17/24 at 8:11 AM EDT

## 2024-03-07 NOTE — Telephone Encounter (Signed)
 Cologuard already in lab results showing negative and documented appropriately.     Electronically signed by CHARMAINE GEORGES, MA on 03/07/24 at 8:12 AM EDT

## 2024-03-07 NOTE — Telephone Encounter (Signed)
-----   Message from Dr. Richard Catterlin, DO sent at 03/07/2024  8:07 AM EDT -----  Please chart this negative colo guard

## 2024-04-22 ENCOUNTER — Encounter

## 2024-04-22 MED ORDER — IBUPROFEN 800 MG PO TABS
800 | ORAL_TABLET | Freq: Two times a day (BID) | ORAL | 1 refills | 15.00000 days | Status: DC
Start: 2024-04-22 — End: 2024-05-25

## 2024-04-22 NOTE — Telephone Encounter (Signed)
"  Last seen 11/20/2023  Next appt 05/25/2024    Requested Prescriptions     Pending Prescriptions Disp Refills    ibuprofen  (ADVIL ;MOTRIN ) 800 MG tablet [Pharmacy Med Name: IBUPROFEN  800 MG TABLET] 60 tablet 1     Sig: TAKE 1 TABLET BY MOUTH TWICE A DAY    Electronically signed by CHARMAINE GEORGES, MA on 04/22/24 at 7:53 AM EDT  "

## 2024-05-25 ENCOUNTER — Ambulatory Visit
Admit: 2024-05-25 | Discharge: 2024-05-25 | Payer: BLUE CROSS/BLUE SHIELD | Attending: Family Medicine | Primary: Family Medicine

## 2024-05-25 VITALS — BP 138/86 | HR 64 | Temp 97.10000°F | Resp 18 | Ht 66.0 in | Wt 118.4 lb

## 2024-05-25 DIAGNOSIS — I10 Essential (primary) hypertension: Principal | ICD-10-CM

## 2024-05-25 MED ORDER — BUPROPION HCL ER (XL) 300 MG PO TB24
300 | ORAL_TABLET | ORAL | 1 refills | Status: AC
Start: 2024-05-25 — End: ?

## 2024-05-25 NOTE — Progress Notes (Signed)
 "SUBJECTIVE  Jane Romero is a 63 y.o. female.    HPI/Chief C/O:  Chief Complaint   Patient presents with    Hypertension     Pt here for a 6 month follow up    Health Maintenance     Due for a mammogram ( pt having a MRI for it)    Neck Pain     Pt has neck pain for a couple weeks      Allergies   Allergen Reactions    Codeine Itching   The patient is here for a medication list and treatment planning review  We will go over our care planning goals as well as take care of all refills  We will set up labs as well        Hypertension  This is a chronic problem. The current episode started more than 1 year ago. Associated symptoms include anxiety, malaise/fatigue and neck pain. Pertinent negatives include no blurred vision, chest pain, headaches, orthopnea, palpitations, peripheral edema, PND, shortness of breath or sweats. Risk factors for coronary artery disease include post-menopausal state and stress. Past treatments include lifestyle changes. Compliance problems include psychosocial issues, exercise and diet.  There is no history of angina, kidney disease, CAD/MI, CVA, heart failure, left ventricular hypertrophy, PVD or retinopathy. There is no history of chronic renal disease, coarctation of the aorta, hyperaldosteronism, hypercortisolism, hyperparathyroidism, a hypertension causing med, pheochromocytoma, renovascular disease, sleep apnea or a thyroid problem.   Neck Pain   Pertinent negatives include no chest pain, fever, headaches, numbness, photophobia, trouble swallowing or weakness.         ROS:  Review of Systems   Constitutional:  Positive for malaise/fatigue. Negative for activity change, appetite change, chills, diaphoresis, fatigue, fever and unexpected weight change.   HENT: Negative.  Negative for congestion, dental problem, drooling, ear discharge, ear pain, facial swelling, hearing loss, mouth sores, nosebleeds, postnasal drip, rhinorrhea, sinus pressure, sinus pain, sneezing, sore throat,  tinnitus, trouble swallowing and voice change.    Eyes:  Negative for blurred vision, photophobia, pain, discharge, redness, itching and visual disturbance.   Respiratory: Negative.  Negative for apnea, cough, choking, chest tightness, shortness of breath, wheezing and stridor.    Cardiovascular: Negative.  Negative for chest pain, palpitations, orthopnea, leg swelling and PND.   Gastrointestinal: Negative.  Negative for abdominal distention, abdominal pain, anal bleeding, blood in stool, constipation, diarrhea, nausea, rectal pain and vomiting.   Endocrine: Negative.  Negative for cold intolerance, heat intolerance, polydipsia, polyphagia and polyuria.   Genitourinary: Negative.  Negative for decreased urine volume, difficulty urinating, dyspareunia, dysuria, enuresis, flank pain, frequency, genital sores, hematuria and urgency.   Musculoskeletal:  Positive for neck pain. Negative for arthralgias, back pain, gait problem, joint swelling, myalgias and neck stiffness.   Skin: Negative.  Negative for color change, pallor, rash and wound.   Allergic/Immunologic: Negative.  Negative for environmental allergies, food allergies and immunocompromised state.   Neurological: Negative.  Negative for dizziness, tremors, seizures, syncope, facial asymmetry, speech difficulty, weakness, light-headedness, numbness and headaches.   Hematological: Negative.  Negative for adenopathy. Does not bruise/bleed easily.   Psychiatric/Behavioral:  Positive for dysphoric mood. Negative for agitation, behavioral problems, confusion, decreased concentration, hallucinations, self-injury, sleep disturbance and suicidal ideas. The patient is nervous/anxious. The patient is not hyperactive.         Past Medical/Surgical Hx;  Reviewed with patient      Diagnosis Date    Depression      History  reviewed. No pertinent surgical history.    Past Family Hx:  Reviewed with patient  History reviewed. No pertinent family history.    Social Hx:  Reviewed  with patient  Social History     Tobacco Use    Smoking status: Former     Current packs/day: 0.00     Average packs/day: 0.3 packs/day for 45.7 years (11.4 ttl pk-yrs)     Types: Cigarettes     Start date: 08/23/1974     Quit date: 04/30/2020     Years since quitting: 4.0    Smokeless tobacco: Never    Tobacco comments:     Quit smoking a year ago.   Substance Use Topics    Alcohol use: Yes       OBJECTIVE  BP 138/86   Pulse 64   Temp 97.1 F (36.2 C)   Resp 18   Ht 1.676 m (5' 6)   Wt 53.7 kg (118 lb 6.4 oz)   LMP  (LMP Unknown)   SpO2 100%   BMI 19.11 kg/m     Problem List:  Verita does not have any pertinent problems on file.    PHYS EX:  Physical Exam  Vitals and nursing note reviewed.   Constitutional:       General: She is not in acute distress.     Appearance: Normal appearance. She is well-developed. She is not ill-appearing, toxic-appearing or diaphoretic.   HENT:      Head: Normocephalic and atraumatic.      Right Ear: External ear normal.      Left Ear: External ear normal.      Nose: Nose normal. No congestion or rhinorrhea.      Mouth/Throat:      Mouth: Mucous membranes are moist.      Pharynx: No oropharyngeal exudate or posterior oropharyngeal erythema.   Eyes:      General: Lids are everted, no foreign bodies appreciated. No scleral icterus.        Right eye: No foreign body, discharge or hordeolum.         Left eye: No foreign body, discharge or hordeolum.      Extraocular Movements: Extraocular movements intact.      Right eye: Normal extraocular motion and no nystagmus.      Left eye: Normal extraocular motion and no nystagmus.      Conjunctiva/sclera: Conjunctivae normal.      Right eye: Right conjunctiva is not injected. No chemosis, exudate or hemorrhage.     Left eye: Left conjunctiva is not injected. No chemosis, exudate or hemorrhage.     Pupils: Pupils are equal, round, and reactive to light. Pupils are equal.      Right eye: Pupil is round and reactive.      Left eye: Pupil is  round and reactive.   Neck:      Thyroid: No thyromegaly.      Vascular: No carotid bruit or JVD.      Trachea: No tracheal deviation.   Cardiovascular:      Rate and Rhythm: Normal rate and regular rhythm. No extrasystoles are present.     Pulses: Normal pulses.      Heart sounds: Heart sounds not distant. Murmur heard.      Systolic murmur is present with a grade of 1/6.      No diastolic murmur is present.      No friction rub. No gallop.   Pulmonary:  Effort: Pulmonary effort is normal. No respiratory distress.      Breath sounds: Normal breath sounds. No stridor. No wheezing, rhonchi or rales.   Chest:      Chest wall: No tenderness.   Abdominal:      General: Bowel sounds are normal. There is no distension.      Palpations: Abdomen is soft. There is no mass.      Tenderness: There is no abdominal tenderness. There is no right CVA tenderness, left CVA tenderness, guarding or rebound.      Hernia: No hernia is present.   Musculoskeletal:         General: No swelling, tenderness, deformity or signs of injury. Normal range of motion.      Cervical back: Normal range of motion and neck supple. No rigidity. No muscular tenderness.      Right lower leg: No edema.      Left lower leg: No edema.   Lymphadenopathy:      Cervical: No cervical adenopathy.   Skin:     General: Skin is warm.      Coloration: Skin is not jaundiced or pale.      Findings: No bruising, erythema, lesion or rash.   Neurological:      General: No focal deficit present.      Mental Status: She is alert and oriented to person, place, and time.      Cranial Nerves: No cranial nerve deficit.      Sensory: No sensory deficit.      Motor: No weakness or abnormal muscle tone.      Coordination: Coordination normal.      Gait: Gait normal.      Deep Tendon Reflexes: Reflexes are normal and symmetric. Reflexes normal.   Psychiatric:      Comments: Mood is stable       ASSESSMENT/PLAN  Ronal Ronal Czar was seen today for hypertension, health  maintenance and neck pain.    Diagnoses and all orders for this visit:    Primary hypertension  --patient is instructed on low to moderate sodium ( 2 to 2.5 grams ), daily    Also to increase potassium in the diet to about 3.5 grams daily    Literature is provided   Long talk on treatment and prevention  Literature is given       Depression, unspecified depression type  -     buPROPion  (WELLBUTRIN  XL) 300 MG extended release tablet; TAKE 1 TABLET BY MOUTH EVERY DAY  -     Comprehensive Metabolic Panel; Future  -     CBC with Auto Differential; Future  --stable on current care planning  -- continue treatment as we are meeting goals   Counseling is given for anxiety and depression   All questions were taken and answers are given        Prediabetes  -     Comprehensive Metabolic Panel; Future  -     CBC with Auto Differential; Future  -     Hemoglobin A1C; Future    Dyslipidemia  -     Comprehensive Metabolic Panel; Future  -     CBC with Auto Differential; Future  -     Lipid Panel; Future  --Mediterranean diet, exercise, weight loss, vitamins    We have a long talk on cholesterol and importance of lowering it       Other fatigue  -     Comprehensive Metabolic Panel; Future  -  CBC with Auto Differential; Future  -     TSH; Future        Outpatient Encounter Medications as of 05/25/2024   Medication Sig Dispense Refill    buPROPion  (WELLBUTRIN  XL) 300 MG extended release tablet TAKE 1 TABLET BY MOUTH EVERY DAY 90 tablet 1    [DISCONTINUED] ibuprofen  (ADVIL ;MOTRIN ) 800 MG tablet TAKE 1 TABLET BY MOUTH TWICE A DAY (Patient not taking: Reported on 05/25/2024) 60 tablet 1    [DISCONTINUED] triamcinolone (KENALOG) 0.1 % cream PLEASE SEE ATTACHED FOR DETAILED DIRECTIONS (Patient not taking: Reported on 05/25/2024)      [DISCONTINUED] buPROPion  (WELLBUTRIN  XL) 300 MG extended release tablet TAKE 1 TABLET BY MOUTH EVERY DAY 90 tablet 1    [DISCONTINUED] famciclovir  (FAMVIR ) 500 MG tablet Take 1 tablet by mouth daily  (Patient taking differently: Take 1 tablet by mouth daily Pt takes in the summer only) 90 tablet 1     No facility-administered encounter medications on file as of 05/25/2024.       Return in about 6 months (around 11/22/2024).        Reviewed recent labs related to [facility] current problems      Discussed importance of regular Health Maintenance follow up  Health Maintenance   Topic    HIV screen     Pneumococcal 50+ years Vaccine (1 of 1 - PCV)    DTaP/Tdap/Td vaccine (1 - Tdap)    DEXA (modify frequency per FRAX score)     Breast cancer screen     COVID-19 Vaccine (6 - 2025-26 season)    Depression Monitoring     Colorectal Cancer Screen     Cervical cancer screen     Lipids     Respiratory Syncytial Virus (RSV) Pregnant or age 75 yrs+ (1 - 1-dose 75+ series)    Flu vaccine     Shingles vaccine     Hepatitis C screen     Hepatitis A vaccine     Hepatitis B vaccine     Hib vaccine     Polio vaccine     Meningococcal (ACWY) vaccine     Meningococcal B vaccine     A1C test (Diabetic or Prediabetic)                                               "
